# Patient Record
Sex: Female | Born: 1935 | Race: Black or African American | Hispanic: No
Health system: Southern US, Community
[De-identification: ages and names within clinical notes are randomized; demographics above are authoritative.]

## PROBLEM LIST (undated history)

## (undated) DIAGNOSIS — D649 Anemia, unspecified: Secondary | ICD-10-CM

## (undated) DIAGNOSIS — E119 Type 2 diabetes mellitus without complications: Secondary | ICD-10-CM

## (undated) DIAGNOSIS — I10 Essential (primary) hypertension: Secondary | ICD-10-CM

## (undated) DIAGNOSIS — N390 Urinary tract infection, site not specified: Secondary | ICD-10-CM

## (undated) DIAGNOSIS — S93409A Sprain of unspecified ligament of unspecified ankle, initial encounter: Secondary | ICD-10-CM

## (undated) DIAGNOSIS — E871 Hypo-osmolality and hyponatremia: Secondary | ICD-10-CM

## (undated) DIAGNOSIS — N189 Chronic kidney disease, unspecified: Secondary | ICD-10-CM

## (undated) DIAGNOSIS — E785 Hyperlipidemia, unspecified: Secondary | ICD-10-CM

## (undated) HISTORY — DX: Anemia, unspecified: D64.9

## (undated) HISTORY — DX: Essential (primary) hypertension: I10

## (undated) HISTORY — DX: Hyperlipidemia, unspecified: E78.5

## (undated) HISTORY — DX: Sprain of unspecified ligament of unspecified ankle, initial encounter: S93.409A

## (undated) HISTORY — DX: Hypo-osmolality and hyponatremia: E87.1

## (undated) HISTORY — DX: Type 2 diabetes mellitus without complications: E11.9

## (undated) HISTORY — DX: Chronic kidney disease, unspecified: N18.9

## (undated) HISTORY — DX: Urinary tract infection, site not specified: N39.0

---

## 1999-09-13 ENCOUNTER — Encounter: Admission: RE | Admit: 1999-09-13 | Discharge: 1999-12-12 | Payer: Self-pay | Admitting: Family Medicine

## 1999-11-16 ENCOUNTER — Encounter: Payer: Self-pay | Admitting: Family Medicine

## 1999-11-16 ENCOUNTER — Encounter: Admission: RE | Admit: 1999-11-16 | Discharge: 1999-11-16 | Payer: Self-pay | Admitting: Family Medicine

## 1999-11-23 ENCOUNTER — Encounter: Payer: Self-pay | Admitting: Family Medicine

## 1999-11-23 ENCOUNTER — Encounter: Admission: RE | Admit: 1999-11-23 | Discharge: 1999-11-23 | Payer: Self-pay | Admitting: Family Medicine

## 1999-12-08 ENCOUNTER — Encounter: Admission: RE | Admit: 1999-12-08 | Discharge: 2000-03-07 | Payer: Self-pay | Admitting: Internal Medicine

## 2000-03-14 ENCOUNTER — Encounter: Admission: RE | Admit: 2000-03-14 | Discharge: 2000-06-12 | Payer: Self-pay | Admitting: Internal Medicine

## 2000-05-23 ENCOUNTER — Encounter: Payer: Self-pay | Admitting: Family Medicine

## 2000-05-23 ENCOUNTER — Encounter: Admission: RE | Admit: 2000-05-23 | Discharge: 2000-05-23 | Payer: Self-pay | Admitting: Family Medicine

## 2000-11-27 ENCOUNTER — Encounter: Admission: RE | Admit: 2000-11-27 | Discharge: 2000-11-27 | Payer: Self-pay | Admitting: Family Medicine

## 2000-11-27 ENCOUNTER — Encounter: Payer: Self-pay | Admitting: Family Medicine

## 2002-02-08 ENCOUNTER — Encounter: Admission: RE | Admit: 2002-02-08 | Discharge: 2002-02-08 | Payer: Self-pay | Admitting: Family Medicine

## 2002-02-08 ENCOUNTER — Encounter: Payer: Self-pay | Admitting: Family Medicine

## 2003-02-11 ENCOUNTER — Encounter: Payer: Self-pay | Admitting: Family Medicine

## 2003-02-11 ENCOUNTER — Encounter: Admission: RE | Admit: 2003-02-11 | Discharge: 2003-02-11 | Payer: Self-pay | Admitting: Family Medicine

## 2003-02-27 ENCOUNTER — Emergency Department (HOSPITAL_COMMUNITY): Admission: EM | Admit: 2003-02-27 | Discharge: 2003-02-27 | Payer: Self-pay | Admitting: Emergency Medicine

## 2003-02-27 ENCOUNTER — Encounter: Payer: Self-pay | Admitting: Emergency Medicine

## 2004-05-12 ENCOUNTER — Encounter: Admission: RE | Admit: 2004-05-12 | Discharge: 2004-05-12 | Payer: Self-pay | Admitting: Internal Medicine

## 2005-07-06 ENCOUNTER — Emergency Department (HOSPITAL_COMMUNITY): Admission: EM | Admit: 2005-07-06 | Discharge: 2005-07-07 | Payer: Self-pay | Admitting: *Deleted

## 2005-11-10 ENCOUNTER — Encounter: Admission: RE | Admit: 2005-11-10 | Discharge: 2005-11-10 | Payer: Self-pay | Admitting: Internal Medicine

## 2007-02-27 ENCOUNTER — Encounter: Admission: RE | Admit: 2007-02-27 | Discharge: 2007-02-27 | Payer: Self-pay | Admitting: *Deleted

## 2008-03-05 ENCOUNTER — Encounter: Admission: RE | Admit: 2008-03-05 | Discharge: 2008-03-05 | Payer: Self-pay | Admitting: Family Medicine

## 2010-11-28 ENCOUNTER — Encounter: Payer: Self-pay | Admitting: Internal Medicine

## 2010-11-28 ENCOUNTER — Encounter: Payer: Self-pay | Admitting: *Deleted

## 2011-08-22 ENCOUNTER — Ambulatory Visit (INDEPENDENT_AMBULATORY_CARE_PROVIDER_SITE_OTHER): Payer: Medicare Other | Admitting: Ophthalmology

## 2011-08-22 DIAGNOSIS — H2700 Aphakia, unspecified eye: Secondary | ICD-10-CM

## 2011-08-22 DIAGNOSIS — E11359 Type 2 diabetes mellitus with proliferative diabetic retinopathy without macular edema: Secondary | ICD-10-CM

## 2011-08-22 DIAGNOSIS — H2510 Age-related nuclear cataract, unspecified eye: Secondary | ICD-10-CM

## 2011-08-22 DIAGNOSIS — H43819 Vitreous degeneration, unspecified eye: Secondary | ICD-10-CM

## 2012-02-06 DIAGNOSIS — E7800 Pure hypercholesterolemia, unspecified: Secondary | ICD-10-CM | POA: Diagnosis not present

## 2012-04-26 DIAGNOSIS — Z79899 Other long term (current) drug therapy: Secondary | ICD-10-CM | POA: Diagnosis not present

## 2012-04-26 DIAGNOSIS — I10 Essential (primary) hypertension: Secondary | ICD-10-CM | POA: Diagnosis not present

## 2012-04-26 DIAGNOSIS — E119 Type 2 diabetes mellitus without complications: Secondary | ICD-10-CM | POA: Diagnosis not present

## 2012-04-26 DIAGNOSIS — E7800 Pure hypercholesterolemia, unspecified: Secondary | ICD-10-CM | POA: Diagnosis not present

## 2012-05-21 ENCOUNTER — Encounter (INDEPENDENT_AMBULATORY_CARE_PROVIDER_SITE_OTHER): Payer: Medicare Other | Admitting: Ophthalmology

## 2012-05-21 DIAGNOSIS — E11359 Type 2 diabetes mellitus with proliferative diabetic retinopathy without macular edema: Secondary | ICD-10-CM

## 2012-05-21 DIAGNOSIS — E1165 Type 2 diabetes mellitus with hyperglycemia: Secondary | ICD-10-CM

## 2012-05-21 DIAGNOSIS — H43819 Vitreous degeneration, unspecified eye: Secondary | ICD-10-CM

## 2012-05-21 DIAGNOSIS — H35039 Hypertensive retinopathy, unspecified eye: Secondary | ICD-10-CM | POA: Diagnosis not present

## 2012-05-21 DIAGNOSIS — H27139 Posterior dislocation of lens, unspecified eye: Secondary | ICD-10-CM | POA: Diagnosis not present

## 2012-05-21 DIAGNOSIS — H2510 Age-related nuclear cataract, unspecified eye: Secondary | ICD-10-CM

## 2012-05-21 DIAGNOSIS — I10 Essential (primary) hypertension: Secondary | ICD-10-CM

## 2012-05-21 DIAGNOSIS — H2700 Aphakia, unspecified eye: Secondary | ICD-10-CM

## 2012-08-31 DIAGNOSIS — Z23 Encounter for immunization: Secondary | ICD-10-CM | POA: Diagnosis not present

## 2012-09-17 DIAGNOSIS — R7301 Impaired fasting glucose: Secondary | ICD-10-CM | POA: Diagnosis not present

## 2012-09-17 DIAGNOSIS — E161 Other hypoglycemia: Secondary | ICD-10-CM | POA: Diagnosis not present

## 2012-09-18 DIAGNOSIS — E1169 Type 2 diabetes mellitus with other specified complication: Secondary | ICD-10-CM | POA: Diagnosis not present

## 2012-10-09 DIAGNOSIS — E119 Type 2 diabetes mellitus without complications: Secondary | ICD-10-CM | POA: Diagnosis not present

## 2012-10-09 DIAGNOSIS — I10 Essential (primary) hypertension: Secondary | ICD-10-CM | POA: Diagnosis not present

## 2012-12-11 DIAGNOSIS — E119 Type 2 diabetes mellitus without complications: Secondary | ICD-10-CM | POA: Diagnosis not present

## 2012-12-11 DIAGNOSIS — E785 Hyperlipidemia, unspecified: Secondary | ICD-10-CM | POA: Diagnosis not present

## 2012-12-11 DIAGNOSIS — I10 Essential (primary) hypertension: Secondary | ICD-10-CM | POA: Diagnosis not present

## 2012-12-11 DIAGNOSIS — Z1331 Encounter for screening for depression: Secondary | ICD-10-CM | POA: Diagnosis not present

## 2013-02-25 ENCOUNTER — Ambulatory Visit (INDEPENDENT_AMBULATORY_CARE_PROVIDER_SITE_OTHER): Payer: Medicare Other | Admitting: Ophthalmology

## 2013-02-25 DIAGNOSIS — H2700 Aphakia, unspecified eye: Secondary | ICD-10-CM

## 2013-02-25 DIAGNOSIS — H35039 Hypertensive retinopathy, unspecified eye: Secondary | ICD-10-CM

## 2013-02-25 DIAGNOSIS — E1165 Type 2 diabetes mellitus with hyperglycemia: Secondary | ICD-10-CM | POA: Diagnosis not present

## 2013-02-25 DIAGNOSIS — H43819 Vitreous degeneration, unspecified eye: Secondary | ICD-10-CM

## 2013-02-25 DIAGNOSIS — E11359 Type 2 diabetes mellitus with proliferative diabetic retinopathy without macular edema: Secondary | ICD-10-CM

## 2013-02-25 DIAGNOSIS — E1139 Type 2 diabetes mellitus with other diabetic ophthalmic complication: Secondary | ICD-10-CM

## 2013-02-25 DIAGNOSIS — I10 Essential (primary) hypertension: Secondary | ICD-10-CM | POA: Diagnosis not present

## 2013-02-25 DIAGNOSIS — H2510 Age-related nuclear cataract, unspecified eye: Secondary | ICD-10-CM

## 2013-06-18 DIAGNOSIS — E7800 Pure hypercholesterolemia, unspecified: Secondary | ICD-10-CM | POA: Diagnosis not present

## 2013-06-18 DIAGNOSIS — Z23 Encounter for immunization: Secondary | ICD-10-CM | POA: Diagnosis not present

## 2013-06-18 DIAGNOSIS — E119 Type 2 diabetes mellitus without complications: Secondary | ICD-10-CM | POA: Diagnosis not present

## 2013-06-18 DIAGNOSIS — I10 Essential (primary) hypertension: Secondary | ICD-10-CM | POA: Diagnosis not present

## 2013-07-30 DIAGNOSIS — Z23 Encounter for immunization: Secondary | ICD-10-CM | POA: Diagnosis not present

## 2013-11-27 ENCOUNTER — Ambulatory Visit (INDEPENDENT_AMBULATORY_CARE_PROVIDER_SITE_OTHER): Payer: Medicare Other | Admitting: Ophthalmology

## 2013-11-27 DIAGNOSIS — E1165 Type 2 diabetes mellitus with hyperglycemia: Secondary | ICD-10-CM | POA: Diagnosis not present

## 2013-11-27 DIAGNOSIS — H2510 Age-related nuclear cataract, unspecified eye: Secondary | ICD-10-CM

## 2013-11-27 DIAGNOSIS — I10 Essential (primary) hypertension: Secondary | ICD-10-CM

## 2013-11-27 DIAGNOSIS — E1139 Type 2 diabetes mellitus with other diabetic ophthalmic complication: Secondary | ICD-10-CM | POA: Diagnosis not present

## 2013-11-27 DIAGNOSIS — H35039 Hypertensive retinopathy, unspecified eye: Secondary | ICD-10-CM

## 2013-11-27 DIAGNOSIS — H43819 Vitreous degeneration, unspecified eye: Secondary | ICD-10-CM

## 2013-11-27 DIAGNOSIS — E11359 Type 2 diabetes mellitus with proliferative diabetic retinopathy without macular edema: Secondary | ICD-10-CM | POA: Diagnosis not present

## 2013-12-17 DIAGNOSIS — I10 Essential (primary) hypertension: Secondary | ICD-10-CM | POA: Diagnosis not present

## 2013-12-17 DIAGNOSIS — M6740 Ganglion, unspecified site: Secondary | ICD-10-CM | POA: Diagnosis not present

## 2013-12-17 DIAGNOSIS — R32 Unspecified urinary incontinence: Secondary | ICD-10-CM | POA: Diagnosis not present

## 2013-12-17 DIAGNOSIS — Z1331 Encounter for screening for depression: Secondary | ICD-10-CM | POA: Diagnosis not present

## 2013-12-17 DIAGNOSIS — E119 Type 2 diabetes mellitus without complications: Secondary | ICD-10-CM | POA: Diagnosis not present

## 2013-12-17 DIAGNOSIS — E785 Hyperlipidemia, unspecified: Secondary | ICD-10-CM | POA: Diagnosis not present

## 2013-12-17 DIAGNOSIS — N183 Chronic kidney disease, stage 3 unspecified: Secondary | ICD-10-CM | POA: Diagnosis not present

## 2013-12-17 DIAGNOSIS — Z23 Encounter for immunization: Secondary | ICD-10-CM | POA: Diagnosis not present

## 2014-06-16 DIAGNOSIS — I10 Essential (primary) hypertension: Secondary | ICD-10-CM | POA: Diagnosis not present

## 2014-06-16 DIAGNOSIS — E559 Vitamin D deficiency, unspecified: Secondary | ICD-10-CM | POA: Diagnosis not present

## 2014-06-16 DIAGNOSIS — N183 Chronic kidney disease, stage 3 unspecified: Secondary | ICD-10-CM | POA: Diagnosis not present

## 2014-06-16 DIAGNOSIS — E119 Type 2 diabetes mellitus without complications: Secondary | ICD-10-CM | POA: Diagnosis not present

## 2014-06-16 DIAGNOSIS — E7800 Pure hypercholesterolemia, unspecified: Secondary | ICD-10-CM | POA: Diagnosis not present

## 2014-07-11 DIAGNOSIS — Z23 Encounter for immunization: Secondary | ICD-10-CM | POA: Diagnosis not present

## 2014-07-11 DIAGNOSIS — N183 Chronic kidney disease, stage 3 unspecified: Secondary | ICD-10-CM | POA: Diagnosis not present

## 2014-08-27 ENCOUNTER — Ambulatory Visit (INDEPENDENT_AMBULATORY_CARE_PROVIDER_SITE_OTHER): Payer: Medicare Other | Admitting: Ophthalmology

## 2014-08-27 DIAGNOSIS — E11359 Type 2 diabetes mellitus with proliferative diabetic retinopathy without macular edema: Secondary | ICD-10-CM | POA: Diagnosis not present

## 2014-08-27 DIAGNOSIS — E11351 Type 2 diabetes mellitus with proliferative diabetic retinopathy with macular edema: Secondary | ICD-10-CM

## 2014-08-27 DIAGNOSIS — H35033 Hypertensive retinopathy, bilateral: Secondary | ICD-10-CM

## 2014-08-27 DIAGNOSIS — H2702 Aphakia, left eye: Secondary | ICD-10-CM

## 2014-08-27 DIAGNOSIS — I10 Essential (primary) hypertension: Secondary | ICD-10-CM | POA: Diagnosis not present

## 2014-08-27 DIAGNOSIS — E11311 Type 2 diabetes mellitus with unspecified diabetic retinopathy with macular edema: Secondary | ICD-10-CM

## 2014-08-27 DIAGNOSIS — H2511 Age-related nuclear cataract, right eye: Secondary | ICD-10-CM

## 2014-12-17 DIAGNOSIS — R079 Chest pain, unspecified: Secondary | ICD-10-CM | POA: Diagnosis not present

## 2014-12-17 DIAGNOSIS — E119 Type 2 diabetes mellitus without complications: Secondary | ICD-10-CM | POA: Diagnosis not present

## 2014-12-17 DIAGNOSIS — E780 Pure hypercholesterolemia: Secondary | ICD-10-CM | POA: Diagnosis not present

## 2014-12-17 DIAGNOSIS — E559 Vitamin D deficiency, unspecified: Secondary | ICD-10-CM | POA: Diagnosis not present

## 2014-12-17 DIAGNOSIS — I10 Essential (primary) hypertension: Secondary | ICD-10-CM | POA: Diagnosis not present

## 2014-12-17 DIAGNOSIS — N189 Chronic kidney disease, unspecified: Secondary | ICD-10-CM | POA: Diagnosis not present

## 2014-12-26 DIAGNOSIS — R0789 Other chest pain: Secondary | ICD-10-CM | POA: Diagnosis not present

## 2014-12-26 DIAGNOSIS — N183 Chronic kidney disease, stage 3 (moderate): Secondary | ICD-10-CM | POA: Diagnosis not present

## 2014-12-26 DIAGNOSIS — I10 Essential (primary) hypertension: Secondary | ICD-10-CM | POA: Diagnosis not present

## 2014-12-26 DIAGNOSIS — E1121 Type 2 diabetes mellitus with diabetic nephropathy: Secondary | ICD-10-CM | POA: Diagnosis not present

## 2014-12-26 DIAGNOSIS — E668 Other obesity: Secondary | ICD-10-CM | POA: Diagnosis not present

## 2014-12-26 DIAGNOSIS — E784 Other hyperlipidemia: Secondary | ICD-10-CM | POA: Diagnosis not present

## 2014-12-26 DIAGNOSIS — R079 Chest pain, unspecified: Secondary | ICD-10-CM | POA: Diagnosis not present

## 2015-06-17 DIAGNOSIS — E559 Vitamin D deficiency, unspecified: Secondary | ICD-10-CM | POA: Diagnosis not present

## 2015-06-17 DIAGNOSIS — E119 Type 2 diabetes mellitus without complications: Secondary | ICD-10-CM | POA: Diagnosis not present

## 2015-06-17 DIAGNOSIS — Z1389 Encounter for screening for other disorder: Secondary | ICD-10-CM | POA: Diagnosis not present

## 2015-06-17 DIAGNOSIS — E780 Pure hypercholesterolemia: Secondary | ICD-10-CM | POA: Diagnosis not present

## 2015-06-17 DIAGNOSIS — I10 Essential (primary) hypertension: Secondary | ICD-10-CM | POA: Diagnosis not present

## 2015-06-17 DIAGNOSIS — N189 Chronic kidney disease, unspecified: Secondary | ICD-10-CM | POA: Diagnosis not present

## 2015-08-07 DIAGNOSIS — Z23 Encounter for immunization: Secondary | ICD-10-CM | POA: Diagnosis not present

## 2015-08-31 ENCOUNTER — Ambulatory Visit (INDEPENDENT_AMBULATORY_CARE_PROVIDER_SITE_OTHER): Payer: Medicare Other | Admitting: Ophthalmology

## 2015-08-31 DIAGNOSIS — H2702 Aphakia, left eye: Secondary | ICD-10-CM | POA: Diagnosis not present

## 2015-08-31 DIAGNOSIS — H43813 Vitreous degeneration, bilateral: Secondary | ICD-10-CM | POA: Diagnosis not present

## 2015-08-31 DIAGNOSIS — I10 Essential (primary) hypertension: Secondary | ICD-10-CM | POA: Diagnosis not present

## 2015-08-31 DIAGNOSIS — E11319 Type 2 diabetes mellitus with unspecified diabetic retinopathy without macular edema: Secondary | ICD-10-CM | POA: Diagnosis not present

## 2015-08-31 DIAGNOSIS — E113593 Type 2 diabetes mellitus with proliferative diabetic retinopathy without macular edema, bilateral: Secondary | ICD-10-CM | POA: Diagnosis not present

## 2015-08-31 DIAGNOSIS — H2511 Age-related nuclear cataract, right eye: Secondary | ICD-10-CM

## 2015-08-31 DIAGNOSIS — H35033 Hypertensive retinopathy, bilateral: Secondary | ICD-10-CM | POA: Diagnosis not present

## 2015-12-18 DIAGNOSIS — Z7984 Long term (current) use of oral hypoglycemic drugs: Secondary | ICD-10-CM | POA: Diagnosis not present

## 2015-12-18 DIAGNOSIS — M25561 Pain in right knee: Secondary | ICD-10-CM | POA: Diagnosis not present

## 2015-12-18 DIAGNOSIS — E559 Vitamin D deficiency, unspecified: Secondary | ICD-10-CM | POA: Diagnosis not present

## 2015-12-18 DIAGNOSIS — E7800 Pure hypercholesterolemia, unspecified: Secondary | ICD-10-CM | POA: Diagnosis not present

## 2015-12-18 DIAGNOSIS — N189 Chronic kidney disease, unspecified: Secondary | ICD-10-CM | POA: Diagnosis not present

## 2015-12-18 DIAGNOSIS — E119 Type 2 diabetes mellitus without complications: Secondary | ICD-10-CM | POA: Diagnosis not present

## 2015-12-18 DIAGNOSIS — I10 Essential (primary) hypertension: Secondary | ICD-10-CM | POA: Diagnosis not present

## 2016-06-02 ENCOUNTER — Ambulatory Visit (INDEPENDENT_AMBULATORY_CARE_PROVIDER_SITE_OTHER): Payer: Medicare Other | Admitting: Ophthalmology

## 2016-06-02 DIAGNOSIS — E113593 Type 2 diabetes mellitus with proliferative diabetic retinopathy without macular edema, bilateral: Secondary | ICD-10-CM

## 2016-06-02 DIAGNOSIS — E11319 Type 2 diabetes mellitus with unspecified diabetic retinopathy without macular edema: Secondary | ICD-10-CM | POA: Diagnosis not present

## 2016-06-02 DIAGNOSIS — H43813 Vitreous degeneration, bilateral: Secondary | ICD-10-CM | POA: Diagnosis not present

## 2016-06-02 DIAGNOSIS — I10 Essential (primary) hypertension: Secondary | ICD-10-CM | POA: Diagnosis not present

## 2016-06-02 DIAGNOSIS — H35033 Hypertensive retinopathy, bilateral: Secondary | ICD-10-CM

## 2016-06-02 DIAGNOSIS — H2511 Age-related nuclear cataract, right eye: Secondary | ICD-10-CM

## 2016-06-15 DIAGNOSIS — N189 Chronic kidney disease, unspecified: Secondary | ICD-10-CM | POA: Diagnosis not present

## 2016-06-15 DIAGNOSIS — I10 Essential (primary) hypertension: Secondary | ICD-10-CM | POA: Diagnosis not present

## 2016-06-15 DIAGNOSIS — E559 Vitamin D deficiency, unspecified: Secondary | ICD-10-CM | POA: Diagnosis not present

## 2016-06-15 DIAGNOSIS — E7800 Pure hypercholesterolemia, unspecified: Secondary | ICD-10-CM | POA: Diagnosis not present

## 2016-06-15 DIAGNOSIS — Z1389 Encounter for screening for other disorder: Secondary | ICD-10-CM | POA: Diagnosis not present

## 2016-06-15 DIAGNOSIS — Z7984 Long term (current) use of oral hypoglycemic drugs: Secondary | ICD-10-CM | POA: Diagnosis not present

## 2016-06-15 DIAGNOSIS — E119 Type 2 diabetes mellitus without complications: Secondary | ICD-10-CM | POA: Diagnosis not present

## 2016-07-28 DIAGNOSIS — Z23 Encounter for immunization: Secondary | ICD-10-CM | POA: Diagnosis not present

## 2016-12-16 DIAGNOSIS — Z7984 Long term (current) use of oral hypoglycemic drugs: Secondary | ICD-10-CM | POA: Diagnosis not present

## 2016-12-16 DIAGNOSIS — E559 Vitamin D deficiency, unspecified: Secondary | ICD-10-CM | POA: Diagnosis not present

## 2016-12-16 DIAGNOSIS — E7800 Pure hypercholesterolemia, unspecified: Secondary | ICD-10-CM | POA: Diagnosis not present

## 2016-12-16 DIAGNOSIS — N189 Chronic kidney disease, unspecified: Secondary | ICD-10-CM | POA: Diagnosis not present

## 2016-12-16 DIAGNOSIS — I10 Essential (primary) hypertension: Secondary | ICD-10-CM | POA: Diagnosis not present

## 2016-12-16 DIAGNOSIS — E119 Type 2 diabetes mellitus without complications: Secondary | ICD-10-CM | POA: Diagnosis not present

## 2016-12-16 DIAGNOSIS — R350 Frequency of micturition: Secondary | ICD-10-CM | POA: Diagnosis not present

## 2017-06-02 ENCOUNTER — Ambulatory Visit (INDEPENDENT_AMBULATORY_CARE_PROVIDER_SITE_OTHER): Payer: Medicare Other | Admitting: Ophthalmology

## 2017-06-02 DIAGNOSIS — E11319 Type 2 diabetes mellitus with unspecified diabetic retinopathy without macular edema: Secondary | ICD-10-CM

## 2017-06-02 DIAGNOSIS — E113593 Type 2 diabetes mellitus with proliferative diabetic retinopathy without macular edema, bilateral: Secondary | ICD-10-CM

## 2017-06-02 DIAGNOSIS — H43813 Vitreous degeneration, bilateral: Secondary | ICD-10-CM

## 2017-06-02 DIAGNOSIS — I10 Essential (primary) hypertension: Secondary | ICD-10-CM

## 2017-06-02 DIAGNOSIS — H35033 Hypertensive retinopathy, bilateral: Secondary | ICD-10-CM | POA: Diagnosis not present

## 2017-06-27 DIAGNOSIS — I10 Essential (primary) hypertension: Secondary | ICD-10-CM | POA: Diagnosis not present

## 2017-06-27 DIAGNOSIS — E7800 Pure hypercholesterolemia, unspecified: Secondary | ICD-10-CM | POA: Diagnosis not present

## 2017-06-27 DIAGNOSIS — E119 Type 2 diabetes mellitus without complications: Secondary | ICD-10-CM | POA: Diagnosis not present

## 2017-06-27 DIAGNOSIS — R351 Nocturia: Secondary | ICD-10-CM | POA: Diagnosis not present

## 2017-06-27 DIAGNOSIS — Z7984 Long term (current) use of oral hypoglycemic drugs: Secondary | ICD-10-CM | POA: Diagnosis not present

## 2017-06-27 DIAGNOSIS — N189 Chronic kidney disease, unspecified: Secondary | ICD-10-CM | POA: Diagnosis not present

## 2017-06-27 DIAGNOSIS — Z23 Encounter for immunization: Secondary | ICD-10-CM | POA: Diagnosis not present

## 2017-06-27 DIAGNOSIS — E559 Vitamin D deficiency, unspecified: Secondary | ICD-10-CM | POA: Diagnosis not present

## 2017-08-08 DIAGNOSIS — N390 Urinary tract infection, site not specified: Secondary | ICD-10-CM | POA: Diagnosis not present

## 2017-12-28 DIAGNOSIS — E119 Type 2 diabetes mellitus without complications: Secondary | ICD-10-CM | POA: Diagnosis not present

## 2017-12-28 DIAGNOSIS — R0989 Other specified symptoms and signs involving the circulatory and respiratory systems: Secondary | ICD-10-CM | POA: Diagnosis not present

## 2017-12-28 DIAGNOSIS — E7800 Pure hypercholesterolemia, unspecified: Secondary | ICD-10-CM | POA: Diagnosis not present

## 2017-12-28 DIAGNOSIS — I10 Essential (primary) hypertension: Secondary | ICD-10-CM | POA: Diagnosis not present

## 2017-12-28 DIAGNOSIS — N189 Chronic kidney disease, unspecified: Secondary | ICD-10-CM | POA: Diagnosis not present

## 2017-12-28 DIAGNOSIS — Z7984 Long term (current) use of oral hypoglycemic drugs: Secondary | ICD-10-CM | POA: Diagnosis not present

## 2017-12-28 DIAGNOSIS — E559 Vitamin D deficiency, unspecified: Secondary | ICD-10-CM | POA: Diagnosis not present

## 2017-12-29 ENCOUNTER — Other Ambulatory Visit: Payer: Self-pay | Admitting: Family Medicine

## 2017-12-29 DIAGNOSIS — R0989 Other specified symptoms and signs involving the circulatory and respiratory systems: Secondary | ICD-10-CM

## 2018-01-08 ENCOUNTER — Ambulatory Visit
Admission: RE | Admit: 2018-01-08 | Discharge: 2018-01-08 | Disposition: A | Discharge status: Home / Self Care | Payer: Medicare Other | Source: Ambulatory Visit | Attending: Family Medicine | Admitting: Family Medicine

## 2018-01-08 DIAGNOSIS — E785 Hyperlipidemia, unspecified: Secondary | ICD-10-CM | POA: Diagnosis not present

## 2018-01-08 DIAGNOSIS — R0989 Other specified symptoms and signs involving the circulatory and respiratory systems: Secondary | ICD-10-CM

## 2018-01-08 DIAGNOSIS — I10 Essential (primary) hypertension: Secondary | ICD-10-CM | POA: Diagnosis not present

## 2018-06-12 ENCOUNTER — Encounter (INDEPENDENT_AMBULATORY_CARE_PROVIDER_SITE_OTHER): Payer: Medicare Other | Admitting: Ophthalmology

## 2018-06-12 DIAGNOSIS — E10319 Type 1 diabetes mellitus with unspecified diabetic retinopathy without macular edema: Secondary | ICD-10-CM

## 2018-06-12 DIAGNOSIS — E103593 Type 1 diabetes mellitus with proliferative diabetic retinopathy without macular edema, bilateral: Secondary | ICD-10-CM | POA: Diagnosis not present

## 2018-06-12 DIAGNOSIS — I10 Essential (primary) hypertension: Secondary | ICD-10-CM | POA: Diagnosis not present

## 2018-06-12 DIAGNOSIS — H2702 Aphakia, left eye: Secondary | ICD-10-CM

## 2018-06-12 DIAGNOSIS — H2511 Age-related nuclear cataract, right eye: Secondary | ICD-10-CM

## 2018-06-12 DIAGNOSIS — H43813 Vitreous degeneration, bilateral: Secondary | ICD-10-CM

## 2018-06-12 DIAGNOSIS — H35033 Hypertensive retinopathy, bilateral: Secondary | ICD-10-CM | POA: Diagnosis not present

## 2018-07-03 DIAGNOSIS — E559 Vitamin D deficiency, unspecified: Secondary | ICD-10-CM | POA: Diagnosis not present

## 2018-07-03 DIAGNOSIS — Z1331 Encounter for screening for depression: Secondary | ICD-10-CM | POA: Diagnosis not present

## 2018-07-03 DIAGNOSIS — Z0000 Encounter for general adult medical examination without abnormal findings: Secondary | ICD-10-CM | POA: Diagnosis not present

## 2018-07-03 DIAGNOSIS — R350 Frequency of micturition: Secondary | ICD-10-CM | POA: Diagnosis not present

## 2018-07-03 DIAGNOSIS — E119 Type 2 diabetes mellitus without complications: Secondary | ICD-10-CM | POA: Diagnosis not present

## 2018-07-03 DIAGNOSIS — I10 Essential (primary) hypertension: Secondary | ICD-10-CM | POA: Diagnosis not present

## 2018-07-03 DIAGNOSIS — N189 Chronic kidney disease, unspecified: Secondary | ICD-10-CM | POA: Diagnosis not present

## 2018-07-03 DIAGNOSIS — E7800 Pure hypercholesterolemia, unspecified: Secondary | ICD-10-CM | POA: Diagnosis not present

## 2018-08-07 DIAGNOSIS — Z23 Encounter for immunization: Secondary | ICD-10-CM | POA: Diagnosis not present

## 2019-01-07 DIAGNOSIS — Z7984 Long term (current) use of oral hypoglycemic drugs: Secondary | ICD-10-CM | POA: Diagnosis not present

## 2019-01-07 DIAGNOSIS — E7800 Pure hypercholesterolemia, unspecified: Secondary | ICD-10-CM | POA: Diagnosis not present

## 2019-01-07 DIAGNOSIS — E119 Type 2 diabetes mellitus without complications: Secondary | ICD-10-CM | POA: Diagnosis not present

## 2019-01-07 DIAGNOSIS — E559 Vitamin D deficiency, unspecified: Secondary | ICD-10-CM | POA: Diagnosis not present

## 2019-01-07 DIAGNOSIS — I10 Essential (primary) hypertension: Secondary | ICD-10-CM | POA: Diagnosis not present

## 2019-01-07 DIAGNOSIS — N189 Chronic kidney disease, unspecified: Secondary | ICD-10-CM | POA: Diagnosis not present

## 2019-06-13 ENCOUNTER — Encounter (INDEPENDENT_AMBULATORY_CARE_PROVIDER_SITE_OTHER): Payer: Medicare Other | Admitting: Ophthalmology

## 2019-07-23 DIAGNOSIS — Z23 Encounter for immunization: Secondary | ICD-10-CM | POA: Diagnosis not present

## 2019-09-12 ENCOUNTER — Encounter (INDEPENDENT_AMBULATORY_CARE_PROVIDER_SITE_OTHER): Payer: Medicare Other | Admitting: Ophthalmology

## 2020-01-14 DIAGNOSIS — I10 Essential (primary) hypertension: Secondary | ICD-10-CM | POA: Diagnosis not present

## 2020-01-14 DIAGNOSIS — E7800 Pure hypercholesterolemia, unspecified: Secondary | ICD-10-CM | POA: Diagnosis not present

## 2020-01-14 DIAGNOSIS — E559 Vitamin D deficiency, unspecified: Secondary | ICD-10-CM | POA: Diagnosis not present

## 2020-01-14 DIAGNOSIS — E119 Type 2 diabetes mellitus without complications: Secondary | ICD-10-CM | POA: Diagnosis not present

## 2020-01-14 DIAGNOSIS — N189 Chronic kidney disease, unspecified: Secondary | ICD-10-CM | POA: Diagnosis not present

## 2020-07-21 DIAGNOSIS — I10 Essential (primary) hypertension: Secondary | ICD-10-CM | POA: Diagnosis not present

## 2020-07-21 DIAGNOSIS — N189 Chronic kidney disease, unspecified: Secondary | ICD-10-CM | POA: Diagnosis not present

## 2020-07-21 DIAGNOSIS — E559 Vitamin D deficiency, unspecified: Secondary | ICD-10-CM | POA: Diagnosis not present

## 2020-07-21 DIAGNOSIS — E7800 Pure hypercholesterolemia, unspecified: Secondary | ICD-10-CM | POA: Diagnosis not present

## 2020-07-21 DIAGNOSIS — E119 Type 2 diabetes mellitus without complications: Secondary | ICD-10-CM | POA: Diagnosis not present

## 2021-07-22 DIAGNOSIS — I10 Essential (primary) hypertension: Secondary | ICD-10-CM | POA: Diagnosis not present

## 2021-07-22 DIAGNOSIS — N189 Chronic kidney disease, unspecified: Secondary | ICD-10-CM | POA: Diagnosis not present

## 2021-07-22 DIAGNOSIS — M25549 Pain in joints of unspecified hand: Secondary | ICD-10-CM | POA: Diagnosis not present

## 2021-07-22 DIAGNOSIS — E7800 Pure hypercholesterolemia, unspecified: Secondary | ICD-10-CM | POA: Diagnosis not present

## 2021-07-22 DIAGNOSIS — E559 Vitamin D deficiency, unspecified: Secondary | ICD-10-CM | POA: Diagnosis not present

## 2021-07-22 DIAGNOSIS — E1122 Type 2 diabetes mellitus with diabetic chronic kidney disease: Secondary | ICD-10-CM | POA: Diagnosis not present

## 2021-08-03 DIAGNOSIS — E1122 Type 2 diabetes mellitus with diabetic chronic kidney disease: Secondary | ICD-10-CM | POA: Diagnosis not present

## 2021-08-03 DIAGNOSIS — E7800 Pure hypercholesterolemia, unspecified: Secondary | ICD-10-CM | POA: Diagnosis not present

## 2021-08-03 DIAGNOSIS — N189 Chronic kidney disease, unspecified: Secondary | ICD-10-CM | POA: Diagnosis not present

## 2022-07-06 DIAGNOSIS — I10 Essential (primary) hypertension: Secondary | ICD-10-CM | POA: Diagnosis not present

## 2022-07-06 DIAGNOSIS — E559 Vitamin D deficiency, unspecified: Secondary | ICD-10-CM | POA: Diagnosis not present

## 2022-07-06 DIAGNOSIS — E1122 Type 2 diabetes mellitus with diabetic chronic kidney disease: Secondary | ICD-10-CM | POA: Diagnosis not present

## 2022-07-06 DIAGNOSIS — N189 Chronic kidney disease, unspecified: Secondary | ICD-10-CM | POA: Diagnosis not present

## 2022-07-06 DIAGNOSIS — M25549 Pain in joints of unspecified hand: Secondary | ICD-10-CM | POA: Diagnosis not present

## 2022-07-06 DIAGNOSIS — E7800 Pure hypercholesterolemia, unspecified: Secondary | ICD-10-CM | POA: Diagnosis not present

## 2022-07-14 ENCOUNTER — Emergency Department (HOSPITAL_COMMUNITY): Payer: Medicare Other

## 2022-07-14 ENCOUNTER — Emergency Department (HOSPITAL_COMMUNITY)
Admission: EM | Admit: 2022-07-14 | Discharge: 2022-07-14 | Disposition: A | Discharge status: Home / Self Care | Payer: Medicare Other | Attending: Emergency Medicine | Admitting: Emergency Medicine

## 2022-07-14 ENCOUNTER — Other Ambulatory Visit: Payer: Self-pay

## 2022-07-14 DIAGNOSIS — Z043 Encounter for examination and observation following other accident: Secondary | ICD-10-CM | POA: Diagnosis not present

## 2022-07-14 DIAGNOSIS — S0993XA Unspecified injury of face, initial encounter: Secondary | ICD-10-CM | POA: Diagnosis not present

## 2022-07-14 DIAGNOSIS — R6889 Other general symptoms and signs: Secondary | ICD-10-CM | POA: Diagnosis not present

## 2022-07-14 DIAGNOSIS — S0083XA Contusion of other part of head, initial encounter: Secondary | ICD-10-CM | POA: Insufficient documentation

## 2022-07-14 DIAGNOSIS — W01190A Fall on same level from slipping, tripping and stumbling with subsequent striking against furniture, initial encounter: Secondary | ICD-10-CM | POA: Insufficient documentation

## 2022-07-14 DIAGNOSIS — S0990XA Unspecified injury of head, initial encounter: Secondary | ICD-10-CM | POA: Diagnosis not present

## 2022-07-14 DIAGNOSIS — W19XXXA Unspecified fall, initial encounter: Secondary | ICD-10-CM | POA: Diagnosis not present

## 2022-07-14 DIAGNOSIS — I672 Cerebral atherosclerosis: Secondary | ICD-10-CM | POA: Diagnosis not present

## 2022-07-14 DIAGNOSIS — G8929 Other chronic pain: Secondary | ICD-10-CM | POA: Diagnosis not present

## 2022-07-14 DIAGNOSIS — Z743 Need for continuous supervision: Secondary | ICD-10-CM | POA: Diagnosis not present

## 2022-07-14 NOTE — ED Provider Notes (Signed)
[facility] EMERGENCY DEPARTMENT Provider Note   CSN: [id number] Arrival date & time: 07/14/22  1758     History  Chief Complaint  Patient presents with   Joanne Patterson    Joanne Patterson is a 88 y.o. female.   Fall     This patient is a 88 year old female, she has a history of no anticoagulants, she lives by herself, she was ambulating in her bedroom when she got tripped up and fell forward striking the right side of her face against a dresser and falling to the floor.  She felt a little bit disoriented but did not pass out, she was able to have her daughter call for 911 assistance.  The paramedics helped her to stand up get over to the bed and then get onto the ambulance stretcher.  She had normal vital signs in route, she was without any complaints except for mild pain to the right side of her face around the right periorbital area.  She has no nausea or vomiting, she is not dizzy, she is not having chest pain or shortness of breath.  Home Medications Prior to Admission medications   Not on File      Allergies    Patient has no allergy information on record.    Review of Systems   Review of Systems  All other systems reviewed and are negative.   Physical Exam Updated Vital Signs BP (!) 142/51   Pulse 70   Temp 98.5 F (36.9 C) (Oral)   Resp 17   SpO2 99%  Physical Exam Vitals and nursing note reviewed.  Constitutional:      General: She is not in acute distress.    Appearance: She is well-developed.  HENT:     Head: Normocephalic.     Comments: Mild amount of bruising in the right periorbital region, mild tenderness over the orbital rim, no malocclusion    Mouth/Throat:     Pharynx: No oropharyngeal exudate.  Eyes:     General: No scleral icterus.       Right eye: No discharge.        Left eye: No discharge.     Conjunctiva/sclera: Conjunctivae normal.     Pupils: Pupils are equal, round, and reactive to light.  Neck:     Thyroid: No  thyromegaly.     Vascular: No JVD.  Cardiovascular:     Rate and Rhythm: Normal rate and regular rhythm.     Heart sounds: Normal heart sounds. No murmur heard.    No friction rub. No gallop.  Pulmonary:     Effort: Pulmonary effort is normal. No respiratory distress.     Breath sounds: Normal breath sounds. No wheezing or rales.  Chest:     Chest wall: No tenderness.  Abdominal:     General: Bowel sounds are normal. There is no distension.     Palpations: Abdomen is soft. There is no mass.     Tenderness: There is no abdominal tenderness.  Musculoskeletal:        General: No tenderness. Normal range of motion.     Cervical back: Normal range of motion and neck supple.     Right lower leg: No edema.     Left lower leg: No edema.  Lymphadenopathy:     Cervical: No cervical adenopathy.  Skin:    General: Skin is warm and dry.     Findings: No erythema or rash.  Neurological:     General: No  focal deficit present.     Mental Status: She is alert.     Coordination: Coordination normal.     Comments: Awake alert and follows commands without difficulty in all 4 extremities  Psychiatric:        Behavior: Behavior normal.     ED Results / Procedures / Treatments   Labs (all labs ordered are listed, but only abnormal results are displayed) Labs Reviewed - No data to display  EKG None  Radiology CT Head Wo Contrast  Result Date: 07/14/2022 CLINICAL DATA:  Facial trauma, blunt.  Mechanical fall EXAM: CT HEAD WITHOUT CONTRAST CT MAXILLOFACIAL WITHOUT CONTRAST CT CERVICAL SPINE WITHOUT CONTRAST TECHNIQUE: Multidetector CT imaging of the head, cervical spine, and maxillofacial structures were performed using the standard protocol without intravenous contrast. Multiplanar CT image reconstructions of the cervical spine and maxillofacial structures were also generated. RADIATION DOSE REDUCTION: This exam was performed according to the departmental dose-optimization program which includes  automated exposure control, adjustment of the mA and/or kV according to patient size and/or use of iterative reconstruction technique. COMPARISON:  None Available. FINDINGS: CT HEAD FINDINGS Brain: No evidence of large-territorial acute infarction. No parenchymal hemorrhage. No mass lesion. No extra-axial collection. No mass effect or midline shift. No hydrocephalus. Basilar cisterns are patent. Vascular: No hyperdense vessel. Atherosclerotic calcifications are present within the cavernous internal carotid and vertebral arteries. Skull: No acute fracture or focal lesion. Other: None. CT MAXILLOFACIAL FINDINGS Osseous: No fracture or mandibular dislocation. No destructive process. Sinuses/Orbits: Paranasal sinuses and mastoid air cells are clear. Similar appearance of the left orbit. Otherwise the orbits are unremarkable. Soft tissues: Negative. CT CERVICAL SPINE FINDINGS Alignment: Normal. Skull base and vertebrae: Multilevel mild-to-moderate degenerative changes of the spine. No associated severe osseous neural foraminal or central canal stenosis. No acute fracture. No aggressive appearing focal osseous lesion or focal pathologic process. Soft tissues and spinal canal: No prevertebral fluid or swelling. No visible canal hematoma. Upper chest: Unremarkable. Other: Atherosclerotic plaque of the branches off of the aortic arch. IMPRESSION: 1. No acute intracranial abnormality. 2. No acute displaced facial fracture. 3. No acute displaced fracture or traumatic listhesis of the cervical spine. Electronically Signed   By: Tish Frederickson M.D.   On: 07/14/2022 20:23   CT Maxillofacial Wo Contrast  Result Date: 07/14/2022 CLINICAL DATA:  Facial trauma, blunt.  Mechanical fall EXAM: CT HEAD WITHOUT CONTRAST CT MAXILLOFACIAL WITHOUT CONTRAST CT CERVICAL SPINE WITHOUT CONTRAST TECHNIQUE: Multidetector CT imaging of the head, cervical spine, and maxillofacial structures were performed using the standard protocol without  intravenous contrast. Multiplanar CT image reconstructions of the cervical spine and maxillofacial structures were also generated. RADIATION DOSE REDUCTION: This exam was performed according to the departmental dose-optimization program which includes automated exposure control, adjustment of the mA and/or kV according to patient size and/or use of iterative reconstruction technique. COMPARISON:  None Available. FINDINGS: CT HEAD FINDINGS Brain: No evidence of large-territorial acute infarction. No parenchymal hemorrhage. No mass lesion. No extra-axial collection. No mass effect or midline shift. No hydrocephalus. Basilar cisterns are patent. Vascular: No hyperdense vessel. Atherosclerotic calcifications are present within the cavernous internal carotid and vertebral arteries. Skull: No acute fracture or focal lesion. Other: None. CT MAXILLOFACIAL FINDINGS Osseous: No fracture or mandibular dislocation. No destructive process. Sinuses/Orbits: Paranasal sinuses and mastoid air cells are clear. Similar appearance of the left orbit. Otherwise the orbits are unremarkable. Soft tissues: Negative. CT CERVICAL SPINE FINDINGS Alignment: Normal. Skull base and vertebrae: Multilevel mild-to-moderate degenerative changes of  the spine. No associated severe osseous neural foraminal or central canal stenosis. No acute fracture. No aggressive appearing focal osseous lesion or focal pathologic process. Soft tissues and spinal canal: No prevertebral fluid or swelling. No visible canal hematoma. Upper chest: Unremarkable. Other: Atherosclerotic plaque of the branches off of the aortic arch. IMPRESSION: 1. No acute intracranial abnormality. 2. No acute displaced facial fracture. 3. No acute displaced fracture or traumatic listhesis of the cervical spine. Electronically Signed   By: Tish Frederickson M.D.   On: 07/14/2022 20:23   CT Cervical Spine Wo Contrast  Result Date: 07/14/2022 CLINICAL DATA:  Facial trauma, blunt.  Mechanical  fall EXAM: CT HEAD WITHOUT CONTRAST CT MAXILLOFACIAL WITHOUT CONTRAST CT CERVICAL SPINE WITHOUT CONTRAST TECHNIQUE: Multidetector CT imaging of the head, cervical spine, and maxillofacial structures were performed using the standard protocol without intravenous contrast. Multiplanar CT image reconstructions of the cervical spine and maxillofacial structures were also generated. RADIATION DOSE REDUCTION: This exam was performed according to the departmental dose-optimization program which includes automated exposure control, adjustment of the mA and/or kV according to patient size and/or use of iterative reconstruction technique. COMPARISON:  None Available. FINDINGS: CT HEAD FINDINGS Brain: No evidence of large-territorial acute infarction. No parenchymal hemorrhage. No mass lesion. No extra-axial collection. No mass effect or midline shift. No hydrocephalus. Basilar cisterns are patent. Vascular: No hyperdense vessel. Atherosclerotic calcifications are present within the cavernous internal carotid and vertebral arteries. Skull: No acute fracture or focal lesion. Other: None. CT MAXILLOFACIAL FINDINGS Osseous: No fracture or mandibular dislocation. No destructive process. Sinuses/Orbits: Paranasal sinuses and mastoid air cells are clear. Similar appearance of the left orbit. Otherwise the orbits are unremarkable. Soft tissues: Negative. CT CERVICAL SPINE FINDINGS Alignment: Normal. Skull base and vertebrae: Multilevel mild-to-moderate degenerative changes of the spine. No associated severe osseous neural foraminal or central canal stenosis. No acute fracture. No aggressive appearing focal osseous lesion or focal pathologic process. Soft tissues and spinal canal: No prevertebral fluid or swelling. No visible canal hematoma. Upper chest: Unremarkable. Other: Atherosclerotic plaque of the branches off of the aortic arch. IMPRESSION: 1. No acute intracranial abnormality. 2. No acute displaced facial fracture. 3. No  acute displaced fracture or traumatic listhesis of the cervical spine. Electronically Signed   By: Tish Frederickson M.D.   On: 07/14/2022 20:23    Procedures Procedures    Medications Ordered in ED Medications - No data to display  ED Course/ Medical Decision Making/ A&P                           Medical Decision Making Amount and/or Complexity of Data Reviewed Radiology: ordered.   This patient presents to the ED for concern of head injury after a fall differential diagnosis includes trauma to the face maxillofacial structures, no cervical spine tenderness but will need imaging given head injury    Additional history obtained:  Additional history obtained from paramedics External records from outside source obtained and reviewed including prehospital vital signs   Imaging Studies ordered:  I ordered imaging studies including CT head, C spine and CT maxillofacial studies  I independently visualized and interpreted imaging which showed no fractures I agree with the radiologist interpretation   Medicines ordered and prescription drug management:   I have reviewed the patients home medicines and have made adjustments as needed   Problem List / ED Course:  No fractures Patient updated   Social Determinants of Health:  None  Final Clinical Impression(s) / ED Diagnoses Final diagnoses:  Contusion of face, initial encounter  Minor head injury, initial encounter    Rx / DC Orders ED Discharge Orders     None         Eber Hong, MD 07/14/22 2137

## 2022-07-14 NOTE — Discharge Instructions (Addendum)
Your xrays are normal Packs for swelling, Tylenol or ibuprofen as needed for pain ER for worsening symptoms otherwise see your doctor in 3 days

## 2022-07-14 NOTE — ED Triage Notes (Signed)
Pt bib [facility] from home for mechanical fall from bed trying to go to bathroom. Pt tripped and hit rt side of face on a wooden dresser. Contusion to the rt side of her face. A&Ox4, GCS 15 with EMS, at baseline according to family. Abrasion to eyebrow and right nostril. Denies LOC, n/v, no thinners.    EMS vitals 180/98 BP 76 HR 98 O2 136 CBG

## 2022-11-17 DIAGNOSIS — I10 Essential (primary) hypertension: Secondary | ICD-10-CM | POA: Diagnosis not present

## 2022-11-17 DIAGNOSIS — E1122 Type 2 diabetes mellitus with diabetic chronic kidney disease: Secondary | ICD-10-CM | POA: Diagnosis not present

## 2022-11-22 DIAGNOSIS — R410 Disorientation, unspecified: Secondary | ICD-10-CM | POA: Diagnosis not present

## 2023-01-16 DIAGNOSIS — E1122 Type 2 diabetes mellitus with diabetic chronic kidney disease: Secondary | ICD-10-CM | POA: Diagnosis not present

## 2023-01-16 DIAGNOSIS — N390 Urinary tract infection, site not specified: Secondary | ICD-10-CM | POA: Diagnosis not present

## 2023-01-16 DIAGNOSIS — E785 Hyperlipidemia, unspecified: Secondary | ICD-10-CM | POA: Diagnosis not present

## 2023-01-16 DIAGNOSIS — I129 Hypertensive chronic kidney disease with stage 1 through stage 4 chronic kidney disease, or unspecified chronic kidney disease: Secondary | ICD-10-CM | POA: Diagnosis not present

## 2023-01-16 DIAGNOSIS — R350 Frequency of micturition: Secondary | ICD-10-CM | POA: Diagnosis not present

## 2023-01-16 DIAGNOSIS — N1832 Chronic kidney disease, stage 3b: Secondary | ICD-10-CM | POA: Diagnosis not present

## 2023-01-17 ENCOUNTER — Other Ambulatory Visit: Payer: Self-pay | Admitting: Internal Medicine

## 2023-01-17 DIAGNOSIS — N1832 Chronic kidney disease, stage 3b: Secondary | ICD-10-CM

## 2023-02-01 ENCOUNTER — Other Ambulatory Visit: Payer: Medicare Other

## 2023-03-03 ENCOUNTER — Ambulatory Visit
Admission: RE | Admit: 2023-03-03 | Discharge: 2023-03-03 | Disposition: A | Discharge status: Home / Self Care | Payer: Medicare Other | Source: Ambulatory Visit | Attending: Internal Medicine | Admitting: Internal Medicine

## 2023-03-03 DIAGNOSIS — N189 Chronic kidney disease, unspecified: Secondary | ICD-10-CM | POA: Diagnosis not present

## 2023-03-03 DIAGNOSIS — N1832 Chronic kidney disease, stage 3b: Secondary | ICD-10-CM

## 2023-05-19 DIAGNOSIS — R350 Frequency of micturition: Secondary | ICD-10-CM | POA: Diagnosis not present

## 2023-07-07 DIAGNOSIS — E7800 Pure hypercholesterolemia, unspecified: Secondary | ICD-10-CM | POA: Diagnosis not present

## 2023-07-07 DIAGNOSIS — N1832 Chronic kidney disease, stage 3b: Secondary | ICD-10-CM | POA: Diagnosis not present

## 2023-07-07 DIAGNOSIS — I10 Essential (primary) hypertension: Secondary | ICD-10-CM | POA: Diagnosis not present

## 2023-07-07 DIAGNOSIS — E1122 Type 2 diabetes mellitus with diabetic chronic kidney disease: Secondary | ICD-10-CM | POA: Diagnosis not present

## 2023-07-07 DIAGNOSIS — R350 Frequency of micturition: Secondary | ICD-10-CM | POA: Diagnosis not present

## 2023-07-27 DIAGNOSIS — E785 Hyperlipidemia, unspecified: Secondary | ICD-10-CM | POA: Diagnosis not present

## 2023-07-27 DIAGNOSIS — R350 Frequency of micturition: Secondary | ICD-10-CM | POA: Diagnosis not present

## 2023-07-27 DIAGNOSIS — E1122 Type 2 diabetes mellitus with diabetic chronic kidney disease: Secondary | ICD-10-CM | POA: Diagnosis not present

## 2023-07-27 DIAGNOSIS — R001 Bradycardia, unspecified: Secondary | ICD-10-CM | POA: Diagnosis not present

## 2023-07-27 DIAGNOSIS — N1832 Chronic kidney disease, stage 3b: Secondary | ICD-10-CM | POA: Diagnosis not present

## 2023-07-27 DIAGNOSIS — I129 Hypertensive chronic kidney disease with stage 1 through stage 4 chronic kidney disease, or unspecified chronic kidney disease: Secondary | ICD-10-CM | POA: Diagnosis not present

## 2023-08-12 DIAGNOSIS — R52 Pain, unspecified: Secondary | ICD-10-CM | POA: Diagnosis not present

## 2023-08-12 DIAGNOSIS — R051 Acute cough: Secondary | ICD-10-CM | POA: Diagnosis not present

## 2023-08-12 DIAGNOSIS — R5383 Other fatigue: Secondary | ICD-10-CM | POA: Diagnosis not present

## 2023-08-12 DIAGNOSIS — N390 Urinary tract infection, site not specified: Secondary | ICD-10-CM | POA: Diagnosis not present

## 2023-08-12 DIAGNOSIS — Z03818 Encounter for observation for suspected exposure to other biological agents ruled out: Secondary | ICD-10-CM | POA: Diagnosis not present

## 2023-08-15 DIAGNOSIS — R350 Frequency of micturition: Secondary | ICD-10-CM | POA: Diagnosis not present

## 2023-08-15 DIAGNOSIS — N1832 Chronic kidney disease, stage 3b: Secondary | ICD-10-CM | POA: Diagnosis not present

## 2023-08-16 ENCOUNTER — Encounter (HOSPITAL_COMMUNITY): Payer: Self-pay | Admitting: Internal Medicine

## 2023-08-16 ENCOUNTER — Other Ambulatory Visit: Payer: Self-pay

## 2023-08-16 ENCOUNTER — Inpatient Hospital Stay (HOSPITAL_COMMUNITY)
Admission: EM | Admit: 2023-08-16 | Discharge: 2023-08-27 | DRG: 641 | Disposition: A | Discharge status: Home Health | Payer: Medicare Other | Attending: Internal Medicine | Admitting: Internal Medicine

## 2023-08-16 ENCOUNTER — Observation Stay (HOSPITAL_COMMUNITY): Payer: Medicare Other

## 2023-08-16 DIAGNOSIS — N184 Chronic kidney disease, stage 4 (severe): Secondary | ICD-10-CM | POA: Diagnosis present

## 2023-08-16 DIAGNOSIS — Z23 Encounter for immunization: Secondary | ICD-10-CM

## 2023-08-16 DIAGNOSIS — K5900 Constipation, unspecified: Secondary | ICD-10-CM | POA: Diagnosis not present

## 2023-08-16 DIAGNOSIS — R531 Weakness: Secondary | ICD-10-CM

## 2023-08-16 DIAGNOSIS — N3289 Other specified disorders of bladder: Secondary | ICD-10-CM | POA: Diagnosis present

## 2023-08-16 DIAGNOSIS — E875 Hyperkalemia: Secondary | ICD-10-CM | POA: Diagnosis present

## 2023-08-16 DIAGNOSIS — I129 Hypertensive chronic kidney disease with stage 1 through stage 4 chronic kidney disease, or unspecified chronic kidney disease: Secondary | ICD-10-CM | POA: Diagnosis present

## 2023-08-16 DIAGNOSIS — D649 Anemia, unspecified: Secondary | ICD-10-CM | POA: Diagnosis not present

## 2023-08-16 DIAGNOSIS — M7989 Other specified soft tissue disorders: Secondary | ICD-10-CM | POA: Diagnosis not present

## 2023-08-16 DIAGNOSIS — R339 Retention of urine, unspecified: Secondary | ICD-10-CM | POA: Diagnosis not present

## 2023-08-16 DIAGNOSIS — D509 Iron deficiency anemia, unspecified: Secondary | ICD-10-CM | POA: Diagnosis not present

## 2023-08-16 DIAGNOSIS — E871 Hypo-osmolality and hyponatremia: Principal | ICD-10-CM | POA: Diagnosis present

## 2023-08-16 DIAGNOSIS — Z888 Allergy status to other drugs, medicaments and biological substances status: Secondary | ICD-10-CM | POA: Diagnosis not present

## 2023-08-16 DIAGNOSIS — K0889 Other specified disorders of teeth and supporting structures: Secondary | ICD-10-CM | POA: Diagnosis present

## 2023-08-16 DIAGNOSIS — N390 Urinary tract infection, site not specified: Secondary | ICD-10-CM | POA: Diagnosis not present

## 2023-08-16 DIAGNOSIS — R7401 Elevation of levels of liver transaminase levels: Secondary | ICD-10-CM | POA: Diagnosis not present

## 2023-08-16 DIAGNOSIS — H547 Unspecified visual loss: Secondary | ICD-10-CM | POA: Diagnosis present

## 2023-08-16 DIAGNOSIS — Z87440 Personal history of urinary (tract) infections: Secondary | ICD-10-CM | POA: Diagnosis not present

## 2023-08-16 DIAGNOSIS — Z79899 Other long term (current) drug therapy: Secondary | ICD-10-CM | POA: Diagnosis not present

## 2023-08-16 DIAGNOSIS — E1122 Type 2 diabetes mellitus with diabetic chronic kidney disease: Secondary | ICD-10-CM | POA: Diagnosis not present

## 2023-08-16 DIAGNOSIS — Z743 Need for continuous supervision: Secondary | ICD-10-CM | POA: Diagnosis not present

## 2023-08-16 DIAGNOSIS — I10 Essential (primary) hypertension: Secondary | ICD-10-CM | POA: Diagnosis not present

## 2023-08-16 DIAGNOSIS — E872 Acidosis, unspecified: Secondary | ICD-10-CM | POA: Diagnosis present

## 2023-08-16 DIAGNOSIS — R509 Fever, unspecified: Secondary | ICD-10-CM | POA: Diagnosis not present

## 2023-08-16 DIAGNOSIS — S93401A Sprain of unspecified ligament of right ankle, initial encounter: Secondary | ICD-10-CM | POA: Diagnosis present

## 2023-08-16 DIAGNOSIS — Z6833 Body mass index (BMI) 33.0-33.9, adult: Secondary | ICD-10-CM

## 2023-08-16 DIAGNOSIS — R799 Abnormal finding of blood chemistry, unspecified: Secondary | ICD-10-CM | POA: Diagnosis not present

## 2023-08-16 DIAGNOSIS — M25571 Pain in right ankle and joints of right foot: Secondary | ICD-10-CM | POA: Diagnosis not present

## 2023-08-16 DIAGNOSIS — Z7982 Long term (current) use of aspirin: Secondary | ICD-10-CM

## 2023-08-16 DIAGNOSIS — R7989 Other specified abnormal findings of blood chemistry: Secondary | ICD-10-CM | POA: Diagnosis not present

## 2023-08-16 DIAGNOSIS — M85871 Other specified disorders of bone density and structure, right ankle and foot: Secondary | ICD-10-CM | POA: Diagnosis not present

## 2023-08-16 DIAGNOSIS — E785 Hyperlipidemia, unspecified: Secondary | ICD-10-CM | POA: Diagnosis not present

## 2023-08-16 DIAGNOSIS — R0902 Hypoxemia: Secondary | ICD-10-CM | POA: Diagnosis not present

## 2023-08-16 DIAGNOSIS — N1832 Chronic kidney disease, stage 3b: Secondary | ICD-10-CM | POA: Diagnosis not present

## 2023-08-16 DIAGNOSIS — M19071 Primary osteoarthritis, right ankle and foot: Secondary | ICD-10-CM | POA: Diagnosis not present

## 2023-08-16 DIAGNOSIS — Z713 Dietary counseling and surveillance: Secondary | ICD-10-CM

## 2023-08-16 DIAGNOSIS — E782 Mixed hyperlipidemia: Secondary | ICD-10-CM | POA: Diagnosis not present

## 2023-08-16 DIAGNOSIS — E7800 Pure hypercholesterolemia, unspecified: Secondary | ICD-10-CM | POA: Diagnosis not present

## 2023-08-16 DIAGNOSIS — R6889 Other general symptoms and signs: Secondary | ICD-10-CM | POA: Diagnosis not present

## 2023-08-16 DIAGNOSIS — X501XXA Overexertion from prolonged static or awkward postures, initial encounter: Secondary | ICD-10-CM

## 2023-08-16 DIAGNOSIS — E8809 Other disorders of plasma-protein metabolism, not elsewhere classified: Secondary | ICD-10-CM | POA: Diagnosis not present

## 2023-08-16 DIAGNOSIS — E669 Obesity, unspecified: Secondary | ICD-10-CM | POA: Diagnosis present

## 2023-08-16 LAB — URINALYSIS, ROUTINE W REFLEX MICROSCOPIC
Bilirubin Urine: NEGATIVE
Glucose, UA: NEGATIVE mg/dL
Hgb urine dipstick: NEGATIVE
Ketones, ur: NEGATIVE mg/dL
Leukocytes,Ua: NEGATIVE
Nitrite: NEGATIVE
Protein, ur: NEGATIVE mg/dL
Specific Gravity, Urine: 1.006 (ref 1.005–1.030)
pH: 6 (ref 5.0–8.0)

## 2023-08-16 LAB — COMPREHENSIVE METABOLIC PANEL
ALT: 39 U/L (ref 0–44)
AST: 40 U/L (ref 15–41)
Albumin: 3.7 g/dL (ref 3.5–5.0)
Alkaline Phosphatase: 80 U/L (ref 38–126)
Anion gap: 10 (ref 5–15)
BUN: 16 mg/dL (ref 8–23)
CO2: 20 mmol/L — ABNORMAL LOW (ref 22–32)
Calcium: 9.4 mg/dL (ref 8.9–10.3)
Chloride: 90 mmol/L — ABNORMAL LOW (ref 98–111)
Creatinine, Ser: 1.32 mg/dL — ABNORMAL HIGH (ref 0.44–1.00)
GFR, Estimated: 38 mL/min — ABNORMAL LOW (ref >60)
Glucose, Bld: 104 mg/dL — ABNORMAL HIGH (ref 70–99)
Potassium: 4.5 mmol/L (ref 3.5–5.1)
Sodium: 120 mmol/L — ABNORMAL LOW (ref 135–145)
Total Bilirubin: 1 mg/dL (ref 0.3–1.2)
Total Protein: 7 g/dL (ref 6.5–8.1)

## 2023-08-16 LAB — CBC WITH DIFFERENTIAL/PLATELET
Abs Immature Granulocytes: 0.03 10*3/uL (ref 0.00–0.07)
Basophils Absolute: 0 10*3/uL (ref 0.0–0.1)
Basophils Relative: 0 %
Eosinophils Absolute: 0 10*3/uL (ref 0.0–0.5)
Eosinophils Relative: 0 %
HCT: 29.3 % — ABNORMAL LOW (ref 36.0–46.0)
Hemoglobin: 10.1 g/dL — ABNORMAL LOW (ref 12.0–15.0)
Immature Granulocytes: 0 %
Lymphocytes Relative: 21 %
Lymphs Abs: 1.7 10*3/uL (ref 0.7–4.0)
MCH: 29.5 pg (ref 26.0–34.0)
MCHC: 34.5 g/dL (ref 30.0–36.0)
MCV: 85.7 fL (ref 80.0–100.0)
Monocytes Absolute: 0.7 10*3/uL (ref 0.1–1.0)
Monocytes Relative: 9 %
Neutro Abs: 5.6 10*3/uL (ref 1.7–7.7)
Neutrophils Relative %: 70 %
Platelets: 213 10*3/uL (ref 150–400)
RBC: 3.42 MIL/uL — ABNORMAL LOW (ref 3.87–5.11)
RDW: 14 % (ref 11.5–15.5)
WBC: 8.1 10*3/uL (ref 4.0–10.5)
nRBC: 0 % (ref 0.0–0.2)

## 2023-08-16 LAB — OSMOLALITY, URINE: Osmolality, Ur: 242 mosm/kg — ABNORMAL LOW (ref 300–900)

## 2023-08-16 LAB — MAGNESIUM: Magnesium: 1.9 mg/dL (ref 1.7–2.4)

## 2023-08-16 LAB — SODIUM: Sodium: 124 mmol/L — ABNORMAL LOW (ref 135–145)

## 2023-08-16 LAB — OSMOLALITY: Osmolality: 260 mosm/kg — ABNORMAL LOW (ref 275–295)

## 2023-08-16 LAB — PHOSPHORUS: Phosphorus: 2.9 mg/dL (ref 2.5–4.6)

## 2023-08-16 LAB — TSH: TSH: 2.602 u[IU]/mL (ref 0.350–4.500)

## 2023-08-16 MED ORDER — ASPIRIN 81 MG PO TBEC
81.0000 mg | DELAYED_RELEASE_TABLET | ORAL | Status: DC
  Administered 2023-08-16 – 2023-08-25 (×5): 81 mg via ORAL
  Filled 2023-08-16 (×5): qty 1

## 2023-08-16 MED ORDER — SODIUM CHLORIDE 1 G PO TABS
2.0000 g | ORAL_TABLET | Freq: Three times a day (TID) | ORAL | Status: DC
  Administered 2023-08-16 – 2023-08-17 (×3): 2 g via ORAL
  Filled 2023-08-16 (×4): qty 2

## 2023-08-16 MED ORDER — VITAMIN C 500 MG PO TABS: 1000.0000 mg | ORAL_TABLET | Freq: Every day | ORAL | Status: DC

## 2023-08-16 MED ORDER — OXYCODONE HCL 5 MG PO TABS
5.0000 mg | ORAL_TABLET | Freq: Once | ORAL | Status: AC
  Administered 2023-08-16: 5 mg via ORAL
  Filled 2023-08-16: qty 1

## 2023-08-16 MED ORDER — AMLODIPINE BESYLATE 5 MG PO TABS
10.0000 mg | ORAL_TABLET | Freq: Every day | ORAL | Status: DC
  Administered 2023-08-16 – 2023-08-27 (×12): 10 mg via ORAL
  Filled 2023-08-16 (×12): qty 2

## 2023-08-16 MED ORDER — ACETAMINOPHEN 650 MG RE SUPP: 650.0000 mg | Freq: Four times a day (QID) | RECTAL | Status: DC | PRN

## 2023-08-16 MED ORDER — HYDRALAZINE HCL 20 MG/ML IJ SOLN: 10.0000 mg | INTRAMUSCULAR | Status: DC | PRN

## 2023-08-16 MED ORDER — CENTRUM SILVER ULTRA WOMENS PO TABS: 1.0000 | ORAL_TABLET | Freq: Every day | ORAL | Status: DC

## 2023-08-16 MED ORDER — SIMVASTATIN 20 MG PO TABS
20.0000 mg | ORAL_TABLET | Freq: Every evening | ORAL | Status: DC
  Administered 2023-08-16 – 2023-08-24 (×9): 20 mg via ORAL
  Filled 2023-08-16 (×9): qty 1

## 2023-08-16 MED ORDER — CEPHALEXIN 500 MG PO CAPS
500.0000 mg | ORAL_CAPSULE | Freq: Three times a day (TID) | ORAL | Status: AC
  Administered 2023-08-16 – 2023-08-17 (×4): 500 mg via ORAL
  Filled 2023-08-16 (×4): qty 1

## 2023-08-16 MED ORDER — ADULT MULTIVITAMIN W/MINERALS CH
1.0000 | ORAL_TABLET | Freq: Every day | ORAL | Status: DC
  Administered 2023-08-17 – 2023-08-27 (×11): 1 via ORAL
  Filled 2023-08-16 (×11): qty 1

## 2023-08-16 MED ORDER — CARBOXYMETHYLCELL-GLYCERIN PF 0.5-0.9 % OP SOLN: 1.0000 [drp] | Freq: Every day | OPHTHALMIC | Status: DC

## 2023-08-16 MED ORDER — ENOXAPARIN SODIUM 30 MG/0.3ML IJ SOSY
30.0000 mg | PREFILLED_SYRINGE | INTRAMUSCULAR | Status: DC
  Administered 2023-08-16 – 2023-08-21 (×6): 30 mg via SUBCUTANEOUS
  Filled 2023-08-16 (×6): qty 0.3

## 2023-08-16 MED ORDER — POLYVINYL ALCOHOL 1.4 % OP SOLN: 1.0000 [drp] | OPHTHALMIC | Status: DC | PRN

## 2023-08-16 MED ORDER — VITAMIN C 500 MG PO TABS
1000.0000 mg | ORAL_TABLET | Freq: Every day | ORAL | Status: DC
  Administered 2023-08-17 – 2023-08-27 (×11): 1000 mg via ORAL
  Filled 2023-08-16 (×11): qty 2

## 2023-08-16 MED ORDER — ACETAMINOPHEN 325 MG PO TABS
650.0000 mg | ORAL_TABLET | Freq: Four times a day (QID) | ORAL | Status: DC | PRN
  Administered 2023-08-23 – 2023-08-25 (×4): 650 mg via ORAL
  Filled 2023-08-16 (×4): qty 2

## 2023-08-16 NOTE — ED Notes (Signed)
ED TO INPATIENT HANDOFF REPORT  Name/Age/Gender Joanne Patterson 89 y.o. female  Code Status   Home/SNF/Other Home  Chief Complaint Hyponatremia [E87.1]  Level of Care/Admitting Diagnosis ED Disposition     ED Disposition  Admit   Condition  --   Comment  Hospital Area: [facility] [[id number]]  Level of Care: Progressive [102]  Admit to Progressive based on following criteria: NEPHROLOGY stable condition requiring close monitoring for AKI, requiring Hemodialysis or Peritoneal Dialysis either from expected electrolyte imbalance, acidosis, or fluid overload that can be managed by NIPPV or high flow oxygen.  May place patient in observation at [facility] or [facility] if equivalent level of care is available:: Yes  Covid Evaluation: Asymptomatic - no recent exposure (last 10 days) testing not required  Diagnosis: Hyponatremia [098119]  Admitting Physician: Dorcas Carrow [[id number]]  Attending Physician: Dorcas Carrow [[id number]]          Medical History No past medical history on file.  Allergies Allergies  Allergen Reactions   Diphenhydramine Other (See Comments)    Hallucinations, sleepiness    IV Location/Drains/Wounds Patient Lines/Drains/Airways Status     Active Line/Drains/Airways     Name Placement date Placement time Site Days   Peripheral IV 08/16/23 22 G Left Antecubital 08/16/23  1217  Antecubital  less than 1            Labs/Imaging Results for orders placed or performed during the hospital encounter of 08/16/23 (from the past 48 hour(s))  CBC with Differential     Status: Abnormal   Collection Time: 08/16/23 12:20 PM  Result Value Ref Range   WBC 8.1 4.0 - 10.5 K/uL   RBC 3.42 (L) 3.87 - 5.11 MIL/uL   Hemoglobin 10.1 (L) 12.0 - 15.0 g/dL   HCT 57.8 (L) 46.9 - 62.9 %   MCV 85.7 80.0 - 100.0 fL   MCH 29.5 26.0 - 34.0 pg   MCHC 34.5 30.0 - 36.0 g/dL   RDW 52.8 41.3 - 24.4 %   Platelets 213 150 - 400 K/uL   nRBC 0.0 0.0  - 0.2 %   Neutrophils Relative % 70 %   Neutro Abs 5.6 1.7 - 7.7 K/uL   Lymphocytes Relative 21 %   Lymphs Abs 1.7 0.7 - 4.0 K/uL   Monocytes Relative 9 %   Monocytes Absolute 0.7 0.1 - 1.0 K/uL   Eosinophils Relative 0 %   Eosinophils Absolute 0.0 0.0 - 0.5 K/uL   Basophils Relative 0 %   Basophils Absolute 0.0 0.0 - 0.1 K/uL   Immature Granulocytes 0 %   Abs Immature Granulocytes 0.03 0.00 - 0.07 K/uL    Comment: Performed at [facility], [address], [place], [place] [address]  Comprehensive metabolic panel     Status: Abnormal   Collection Time: 08/16/23 12:20 PM  Result Value Ref Range   Sodium 120 (L) 135 - 145 mmol/L   Potassium 4.5 3.5 - 5.1 mmol/L   Chloride 90 (L) 98 - 111 mmol/L   CO2 20 (L) 22 - 32 mmol/L   Glucose, Bld 104 (H) 70 - 99 mg/dL    Comment: Glucose reference range applies only to samples taken after fasting for at least 8 hours.   BUN 16 8 - 23 mg/dL   Creatinine, Ser 2.53 (H) 0.44 - 1.00 mg/dL   Calcium 9.4 8.9 - 66.4 mg/dL   Total Protein 7.0 6.5 - 8.1 g/dL   Albumin 3.7 3.5 -  5.0 g/dL   AST 40 15 - 41 U/L   ALT 39 0 - 44 U/L   Alkaline Phosphatase 80 38 - 126 U/L   Total Bilirubin 1.0 0.3 - 1.2 mg/dL   GFR, Estimated 38 (L) >60 mL/min    Comment: (NOTE) Calculated using the CKD-EPI Creatinine Equation (2021)    Anion gap 10 5 - 15    Comment: Performed at [facility], [address], [place], [place] [address]  Magnesium     Status: None   Collection Time: 08/16/23 12:20 PM  Result Value Ref Range   Magnesium 1.9 1.7 - 2.4 mg/dL    Comment: Performed at [facility], [address], [place], [place] [address]  Phosphorus     Status: None   Collection Time: 08/16/23 12:20 PM  Result Value Ref Range   Phosphorus 2.9 2.5 - 4.6 mg/dL    Comment: Performed at [facility], [address] [place], [place] [address]  TSH     Status: None   Collection Time: 08/16/23   3:19 PM  Result Value Ref Range   TSH 2.602 0.350 - 4.500 uIU/mL    Comment: Performed by a 3rd Generation assay with a functional sensitivity of <=0.01 uIU/mL. Performed at [facility], [address] [place], [place] [address]   Urinalysis, Routine w reflex microscopic -Urine, Clean Catch     Status: Abnormal   Collection Time: 08/16/23  3:23 PM  Result Value Ref Range   Color, Urine STRAW (A) YELLOW   APPearance CLEAR CLEAR   Specific Gravity, Urine 1.006 1.005 - 1.030   pH 6.0 5.0 - 8.0   Glucose, UA NEGATIVE NEGATIVE mg/dL   Hgb urine dipstick NEGATIVE NEGATIVE   Bilirubin Urine NEGATIVE NEGATIVE   Ketones, ur NEGATIVE NEGATIVE mg/dL   Protein, ur NEGATIVE NEGATIVE mg/dL   Nitrite NEGATIVE NEGATIVE   Leukocytes,Ua NEGATIVE NEGATIVE    Comment: Performed at [facility], [address], [place], [place] [address]   No results found.  Pending Labs Unresulted Labs (From admission, onward)     Start     Ordered   08/17/23 0500  Basic metabolic panel  Tomorrow morning,   R        08/16/23 1606   08/16/23 1430  Osmolality  Once,   R        08/16/23 1430   08/16/23 1346  Osmolality, urine  Once,   URGENT        08/16/23 1345            Vitals/Pain Today's Vitals   08/16/23 1455 08/16/23 1500 08/16/23 1554 08/16/23 1639  BP: (!) 171/76 (!) 190/96    Pulse: 87  84   Resp: 20 15 16    Temp:   97.7 F (36.5 C)   TempSrc:   Oral   SpO2: 100% 98% 100%   Weight:      Height:    5\' 5"  (1.651 m)    Isolation Precautions No active isolations  Medications Medications  amLODipine (NORVASC) tablet 10 mg (10 mg Oral Given 08/16/23 1618)  cephALEXin (KEFLEX) capsule 500 mg (500 mg Oral Given 08/16/23 1618)    Mobility walks with device

## 2023-08-16 NOTE — H&P (Signed)
History and Physical    CHRISTELLA LOWENTHAL HYQ:[id number] DOB: 04/15/1928 DOA: 08/16/2023  PCP: Jarrett Soho, PA-C  Patient coming from: Home  I have personally briefly reviewed patient's old medical records available.   Chief Complaint: Weakness, low sodium at outpatient lab  HPI: Joanne Patterson is a 89 y.o. female with medical history significant of type 2 diabetes currently off medications, essential hypertension on amlodipine and olmesartan hydrochlorothiazide, hyperlipidemia who lives at home with her daughter has been following up for recurrent UTI and bladder spasms, she was found to have sodium level of 120 on follow-up blood test at nephrology office and sent to the emergency room.  Patient is poor historian.  Her daughter and granddaughter at the bedside.  According to the family they have been noticing her having less activities and feeling more weakness for at least few weeks now.  Otherwise she has good appetite, she keeps up her 3 square meals and drinks moderate amount of water and Gatorade. Patient was suffering from bladder spasms and frequent urination and her primary care physician sent her to [facility] Dr. Valentino Nose last month where her sodium was 131.  Renal ultrasound was normal.  She was advised to continue her olmesartan hydrochlorothiazide, however was advised to take Gatorade instead of water.  She also followed up with urology who had continued her on Keflex and suggested to keep plenty of water intake.  She went for routine follow-up blood test yesterday where she was found to have sodium of 121 and nephrologist called her to come to the hospital.  Other than taking Keflex, denies any new medications.  Patient has been encouraged to drink plenty of liquid, however family denies that she is taking any excess water than she used to drink for years. ED Course: Hemodynamically.  On room air. Blood pressures elevated has not taken blood pressure medicine in the morning.   Hemoglobin stable and previous levels.  Sodium 120 and chloride 90.  Bicarb is 20.  Creatinine 1.32 at about her baseline.  Due to significant hyponatremia, admission requested.  Nephrology consulted.  Patient also had twisted her right ankle yesterday at doctor's office. She is also complaining of toothache causing problem with mastication.  Review of Systems: all systems are reviewed and pertinent positive as per HPI otherwise rest are negative.    History reviewed. No pertinent past medical history.  History reviewed. No pertinent surgical history.  Social history   has no history on file for tobacco use, alcohol use, and drug use.  Allergies  Allergen Reactions   Diphenhydramine Other (See Comments)    Hallucinations, sleepiness    History reviewed. No pertinent family history.   Prior to Admission medications   Medication Sig Start Date End Date Taking? Authorizing Provider  ALEVE 220 MG tablet Take 220-440 mg by mouth 2 (two) times daily as needed (for pain).   Yes [provider]  amLODipine (NORVASC) 10 MG tablet Take 10 mg by mouth daily.   Yes [provider]  Ascorbic Acid (VITAMIN C) 1000 MG tablet Take 1,000 mg by mouth daily.   Yes [provider]  aspirin EC 81 MG tablet Take 81 mg by mouth every Monday, Wednesday, and Friday. Swallow whole.   Yes [provider]  cephALEXin (KEFLEX) 500 MG capsule Take 500 mg by mouth every 6 (six) hours. 08/13/23 08/18/23 Yes [provider]  loratadine (CLARITIN) 10 MG tablet Take 10 mg by mouth daily as needed for allergies or rhinitis.  Yes [provider]  Multiple Vitamins-Minerals (CENTRUM SILVER ULTRA WOMENS) TABS Take 1 tablet by mouth daily with breakfast.   Yes [provider]  olmesartan-hydrochlorothiazide (BENICAR HCT) 40-12.5 MG tablet Take 1 tablet by mouth daily.   Yes [provider]  Omega-3 Fatty Acids (FISH OIL) 1000 MG CAPS Take 1,000 mg by  mouth daily.   Yes [provider]  PATADAY 0.2 % SOLN Place 1 drop into both eyes 2 (two) times daily.   Yes [provider]  REFRESH TEARS PF 0.5-0.9 % SOLN Place 1 drop into both eyes daily.   Yes [provider]  simvastatin (ZOCOR) 20 MG tablet Take 20 mg by mouth every evening.   Yes [provider]  TYLENOL 500 MG tablet Take 500-1,000 mg by mouth See admin instructions. Take 1,000 mg by mouth in the morning and an additional 500-1,000 mg by mouth once a day as needed for pain   Yes [provider]    Physical Exam: Vitals:   08/16/23 1455 08/16/23 1500 08/16/23 1554 08/16/23 1639  BP: (!) 171/76 (!) 190/96    Pulse: 87  84   Resp: 20 15 16    Temp:   97.7 F (36.5 C)   TempSrc:   Oral   SpO2: 100% 98% 100%   Weight:      Height:    5\' 5"  (1.651 m)    Constitutional: NAD, calm, comfortable.  Pleasant.  Answers questions appropriately but she is not very interactive.  Defers to her daughters. Vitals:   08/16/23 1455 08/16/23 1500 08/16/23 1554 08/16/23 1639  BP: (!) 171/76 (!) 190/96    Pulse: 87  84   Resp: 20 15 16    Temp:   97.7 F (36.5 C)   TempSrc:   Oral   SpO2: 100% 98% 100%   Weight:      Height:    5\' 5"  (1.651 m)   Eyes: PERRL, lids and conjunctivae normal ENMT: Mucous membranes are moist. Posterior pharynx clear of any exudate or lesions.she has few teeth left, most of them with discoloration and carious teeth. Neck: normal, supple, no masses, no thyromegaly Respiratory: clear to auscultation bilaterally, no wheezing, no crackles. Normal respiratory effort. No accessory muscle use.  Cardiovascular: Regular rate and rhythm, no murmurs / rubs / gallops. No extremity edema. 2+ pedal pulses. No carotid bruits.  Abdomen: no tenderness, no masses palpated. No hepatosplenomegaly. Bowel sounds positive.  Musculoskeletal: no clubbing / cyanosis. No joint deformity upper and lower extremities. Good ROM, no contractures.  Normal muscle tone.  Skin: no rashes, lesions, ulcers. No induration Neurologic: CN 2-12 grossly intact. Sensation intact, DTR normal. Strength 5/5 in all 4.  No tremors. Psychiatric: Normal judgment and insight. Alert and oriented x 3.  Flat affect.    Labs on Admission: I have personally reviewed following labs and imaging studies  CBC: Recent Labs  Lab 08/16/23 1220  WBC 8.1  NEUTROABS 5.6  HGB 10.1*  HCT 29.3*  MCV 85.7  PLT 213   Basic Metabolic Panel: Recent Labs  Lab 08/16/23 1220  NA 120*  K 4.5  CL 90*  CO2 20*  GLUCOSE 104*  BUN 16  CREATININE 1.32*  CALCIUM 9.4  MG 1.9  PHOS 2.9   GFR: Estimated Creatinine Clearance: 29 mL/min (A) (by C-G formula based on SCr of 1.32 mg/dL (H)). Liver Function Tests: Recent Labs  Lab 08/16/23 1220  AST 40  ALT 39  ALKPHOS 80  BILITOT  1.0  PROT 7.0  ALBUMIN 3.7   No results for input(s): "LIPASE", "AMYLASE" in the last 168 hours. No results for input(s): "AMMONIA" in the last 168 hours. Coagulation Profile: No results for input(s): "INR", "PROTIME" in the last 168 hours. Cardiac Enzymes: No results for input(s): "CKTOTAL", "CKMB", "CKMBINDEX", "TROPONINI" in the last 168 hours. BNP (last 3 results) No results for input(s): "PROBNP" in the last 8760 hours. HbA1C: No results for input(s): "HGBA1C" in the last 72 hours. CBG: No results for input(s): "GLUCAP" in the last 168 hours. Lipid Profile: No results for input(s): "CHOL", "HDL", "LDLCALC", "TRIG", "CHOLHDL", "LDLDIRECT" in the last 72 hours. Thyroid Function Tests: Recent Labs    08/16/23 1519  TSH 2.602   Anemia Panel: No results for input(s): "VITAMINB12", "FOLATE", "FERRITIN", "TIBC", "IRON", "RETICCTPCT" in the last 72 hours. Urine analysis:    Component Value Date/Time   COLORURINE STRAW (A) 08/16/2023 1523   APPEARANCEUR CLEAR 08/16/2023 1523   LABSPEC 1.006 08/16/2023 1523   PHURINE 6.0 08/16/2023 1523   GLUCOSEU NEGATIVE 08/16/2023 1523    HGBUR NEGATIVE 08/16/2023 1523   BILIRUBINUR NEGATIVE 08/16/2023 1523   KETONESUR NEGATIVE 08/16/2023 1523   PROTEINUR NEGATIVE 08/16/2023 1523   NITRITE NEGATIVE 08/16/2023 1523   LEUKOCYTESUR NEGATIVE 08/16/2023 1523    Radiological Exams on Admission: No results found.  EKG: Independently reviewed.  Normal sinus rhythm.  Assessment/Plan Principal Problem:   Hyponatremia Active Problems:   Essential hypertension   Hyperlipidemia   CKD (chronic kidney disease), stage IV (HCC)     Symptomatic hyponatremia, currently without acute neurological symptoms: This is likely multifactorial.  Patient also on olmesartan hydrochlorothiazide.  Also possible compulsive water drinking due to frequent UTI/bladder spasms. Agree with admission given severity of symptoms Patient is euvolemic and has adequate oral intake. Regular diet, tolerated mL water restriction. Sodium chloride 2 g 3 times daily. Sodium every 6 hours. Urinalysis, osmolality, TSH, urine sodium pending. Nephrology to follow-up.  2.  Essential hypertension: Blood pressures elevated.  Restart amlodipine.  Discontinue hydrochlorothiazide with olmesartan.  3.  Right ankle sprain: X-ray to rule out any fracture.  Mobilize with PT OT if no fractures.  Can use compression bandages.  4.  Hyperlipidemia: Continue statin.  5.  CKD stage IV: At about baseline.  Under surveillance by nephrology as outpatient.   DVT prophylaxis: Lovenox subcu Code Status: Full code Family Communication: Daughter and granddaughter at the bedside Disposition Plan: Home when clinical improvement, anticipate patient will need home health therapies Consults called: Nephrology, notified Admission status: Progressive, observation.  Need for admission will be assessed every day.   Dorcas Carrow MD [facility]

## 2023-08-16 NOTE — ED Triage Notes (Signed)
Pt BIBA from home where she lives with daughte, as directed by primary HCP due to lab results showing hyponatremia. Pt is A&O x 1

## 2023-08-16 NOTE — ED Provider Notes (Signed)
[facility]ERGENCY DEPARTMENT AT [facility] Provider Note   CSN: [id number] Arrival date & time: 08/16/23  1141     History  Chief Complaint  Patient presents with   hyponatremia   abnormal labs    Joanne Patterson is a 89 y.o. female.  HPI    89 year old patient comes in with chief complaint of hyponatremia. Patient has medical history significant of type 2 diabetes currently off medications, essential hypertension on amlodipine and olmesartan hydrochlorothiazide, hyperlipidemia who lives at home with her daughter has been following up for recurrent UTI and bladder spasms, she was found to have sodium level of 120 on follow-up blood test at nephrology office and sent to the emergency room. Her daughter and granddaughter at the bedside.  According to the family they have been noticing her having less activities and feeling more weakness for at least few weeks now.    Patient was suffering from bladder spasms and frequent urination and her primary care physician sent her to [facility] Dr. Valentino Nose last month where her sodium was 131.  She went for routine follow-up blood test yesterday where she was found to have sodium of 121 and nephrologist called her to come to the hospital.    Home Medications Prior to Admission medications   Medication Sig Start Date End Date Taking? Authorizing Provider  ALEVE 220 MG tablet Take 220-440 mg by mouth 2 (two) times daily as needed (for pain).   Yes [provider]  amLODipine (NORVASC) 10 MG tablet Take 10 mg by mouth daily.   Yes [provider]  Ascorbic Acid (VITAMIN C) 1000 MG tablet Take 1,000 mg by mouth daily.   Yes [provider]  aspirin EC 81 MG tablet Take 81 mg by mouth every Monday, Wednesday, and Friday. Swallow whole.   Yes [provider]  loratadine (CLARITIN) 10 MG tablet Take 10 mg by mouth daily as needed for allergies or rhinitis.   Yes [provider]  Multiple  Vitamins-Minerals (CENTRUM SILVER ULTRA WOMENS) TABS Take 1 tablet by mouth daily with breakfast.   Yes [provider]  olmesartan-hydrochlorothiazide (BENICAR HCT) 40-12.5 MG tablet Take 1 tablet by mouth daily.   Yes [provider]  Omega-3 Fatty Acids (FISH OIL) 1000 MG CAPS Take 1,000 mg by mouth daily.   Yes [provider]  PATADAY 0.2 % SOLN Place 1 drop into both eyes 2 (two) times daily.   Yes [provider]  REFRESH TEARS PF 0.5-0.9 % SOLN Place 1 drop into both eyes daily.   Yes [provider]  simvastatin (ZOCOR) 20 MG tablet Take 20 mg by mouth every evening.   Yes [provider]  TYLENOL 500 MG tablet Take 500-1,000 mg by mouth See admin instructions. Take 1,000 mg by mouth in the morning and an additional 500-1,000 mg by mouth once a day as needed for pain   Yes [provider]      Allergies    Diphenhydramine    Review of Systems   Review of Systems  All other systems reviewed and are negative.   Physical Exam Updated Vital Signs BP (!) 174/69 (BP Location: Left Arm)   Pulse 90   Temp 98.2 F (36.8 C) (Oral)   Resp 18   Ht 5\' 5"  (1.651 m)   Wt 90.1 kg   SpO2 100%   BMI 33.05 kg/m  Physical Exam Vitals and nursing note reviewed.  Constitutional:  Appearance: She is well-developed.  HENT:     Head: Atraumatic.  Eyes:     Extraocular Movements: Extraocular movements intact.     Pupils: Pupils are equal, round, and reactive to light.  Cardiovascular:     Rate and Rhythm: Normal rate.  Pulmonary:     Effort: Pulmonary effort is normal.  Musculoskeletal:     Cervical back: Normal range of motion and neck supple.  Skin:    General: Skin is warm and dry.  Neurological:     Mental Status: She is alert and oriented to person, place, and time.     Cranial Nerves: No cranial nerve deficit.     Sensory: No sensory deficit.     Motor: No weakness.     ED Results / Procedures / Treatments    Labs (all labs ordered are listed, but only abnormal results are displayed) Labs Reviewed  CBC WITH DIFFERENTIAL/PLATELET - Abnormal; Notable for the following components:      Result Value   RBC 3.42 (*)    Hemoglobin 10.1 (*)    HCT 29.3 (*)    All other components within normal limits  COMPREHENSIVE METABOLIC PANEL - Abnormal; Notable for the following components:   Sodium 120 (*)    Chloride 90 (*)    CO2 20 (*)    Glucose, Bld 104 (*)    Creatinine, Ser 1.32 (*)    GFR, Estimated 38 (*)    All other components within normal limits  OSMOLALITY, URINE - Abnormal; Notable for the following components:   Osmolality, Ur 242 (*)    All other components within normal limits  URINALYSIS, ROUTINE W REFLEX MICROSCOPIC - Abnormal; Notable for the following components:   Color, Urine STRAW (*)    All other components within normal limits  OSMOLALITY - Abnormal; Notable for the following components:   Osmolality 260 (*)    All other components within normal limits  SODIUM - Abnormal; Notable for the following components:   Sodium 124 (*)    All other components within normal limits  SODIUM - Abnormal; Notable for the following components:   Sodium 123 (*)    All other components within normal limits  SODIUM - Abnormal; Notable for the following components:   Sodium 121 (*)    All other components within normal limits  GLUCOSE, CAPILLARY - Abnormal; Notable for the following components:   Glucose-Capillary 66 (*)    All other components within normal limits  BASIC METABOLIC PANEL - Abnormal; Notable for the following components:   Sodium 121 (*)    Potassium 6.2 (*)    Chloride 91 (*)    CO2 20 (*)    Creatinine, Ser 1.33 (*)    GFR, Estimated 37 (*)    All other components within normal limits  BASIC METABOLIC PANEL - Abnormal; Notable for the following components:   Sodium 119 (*)    Chloride 88 (*)    Glucose, Bld 105 (*)    Creatinine, Ser 1.45 (*)    GFR, Estimated 34  (*)    All other components within normal limits  SODIUM - Abnormal; Notable for the following components:   Sodium 121 (*)    All other components within normal limits  SODIUM - Abnormal; Notable for the following components:   Sodium 123 (*)    All other components within normal limits  SODIUM - Abnormal; Notable for the following components:   Sodium 124 (*)    All other  components within normal limits  SODIUM - Abnormal; Notable for the following components:   Sodium 126 (*)    All other components within normal limits  BASIC METABOLIC PANEL - Abnormal; Notable for the following components:   Sodium 129 (*)    CO2 21 (*)    Creatinine, Ser 1.25 (*)    GFR, Estimated 40 (*)    All other components within normal limits  BASIC METABOLIC PANEL - Abnormal; Notable for the following components:   Sodium 127 (*)    Creatinine, Ser 1.36 (*)    Calcium 8.8 (*)    GFR, Estimated 36 (*)    All other components within normal limits  RENAL FUNCTION PANEL - Abnormal; Notable for the following components:   Sodium 126 (*)    CO2 20 (*)    Creatinine, Ser 1.38 (*)    Calcium 8.7 (*)    Albumin 2.7 (*)    GFR, Estimated 36 (*)    All other components within normal limits  URINALYSIS, ROUTINE W REFLEX MICROSCOPIC - Abnormal; Notable for the following components:   Hgb urine dipstick LARGE (*)    Leukocytes,Ua TRACE (*)    Bacteria, UA RARE (*)    All other components within normal limits  CBC WITH DIFFERENTIAL/PLATELET - Abnormal; Notable for the following components:   RBC 2.66 (*)    Hemoglobin 7.6 (*)    HCT 23.6 (*)    Monocytes Absolute 1.1 (*)    Abs Immature Granulocytes 0.12 (*)    All other components within normal limits  BASIC METABOLIC PANEL - Abnormal; Notable for the following components:   Sodium 130 (*)    Potassium 6.5 (*)    CO2 19 (*)    Creatinine, Ser 1.38 (*)    GFR, Estimated 36 (*)    All other components within normal limits  RENAL FUNCTION PANEL -  Abnormal; Notable for the following components:   Sodium 133 (*)    Potassium 6.5 (*)    CO2 19 (*)    Creatinine, Ser 1.40 (*)    Albumin 2.8 (*)    GFR, Estimated 35 (*)    All other components within normal limits  GLUCOSE, CAPILLARY - Abnormal; Notable for the following components:   Glucose-Capillary 100 (*)    All other components within normal limits  BASIC METABOLIC PANEL - Abnormal; Notable for the following components:   Sodium 125 (*)    Chloride 96 (*)    BUN 25 (*)    Creatinine, Ser 1.38 (*)    GFR, Estimated 36 (*)    All other components within normal limits  CBC - Abnormal; Notable for the following components:   RBC 2.51 (*)    Hemoglobin 7.4 (*)    HCT 22.1 (*)    All other components within normal limits  BASIC METABOLIC PANEL - Abnormal; Notable for the following components:   Sodium 124 (*)    Chloride 97 (*)    CO2 18 (*)    Glucose, Bld 135 (*)    BUN 24 (*)    Creatinine, Ser 1.58 (*)    GFR, Estimated 30 (*)    All other components within normal limits  VITAMIN B12 - Abnormal; Notable for the following components:   Vitamin B-12 1,001 (*)    All other components within normal limits  IRON AND TIBC - Abnormal; Notable for the following components:   Iron 19 (*)    Saturation Ratios 8 (*)  All other components within normal limits  RETICULOCYTES - Abnormal; Notable for the following components:   RBC. 2.77 (*)    Immature Retic Fract 18.2 (*)    All other components within normal limits  MAGNESIUM  PHOSPHORUS  TSH  GLUCOSE, CAPILLARY  SODIUM, URINE, RANDOM  BRAIN NATRIURETIC PEPTIDE  GLUCOSE, CAPILLARY  CORTISOL-AM, BLOOD  GLUCOSE, CAPILLARY  MAGNESIUM  SODIUM, URINE, RANDOM  OSMOLALITY  OSMOLALITY, URINE  GLUCOSE, CAPILLARY  POTASSIUM  POTASSIUM  POTASSIUM  POTASSIUM  GLUCOSE, CAPILLARY  OSMOLALITY, URINE  FOLATE  FERRITIN  BASIC METABOLIC PANEL  CBC    EKG EKG Interpretation Date/Time:  Wednesday August 16 2023  11:55:37 EDT Ventricular Rate:  76 PR Interval:  194 QRS Duration:  91 QT Interval:  373 QTC Calculation: 420 R Axis:   40  Text Interpretation: Sinus rhythm Abnormal R-wave progression, early transition Confirmed by Lorre Nick ([id number]) on 08/17/2023 2:32:31 PM  Date: 08/22/2023  Rate: 76  Rhythm: normal sinus rhythm  QRS Axis: normal  Intervals: normal  ST/T Wave abnormalities: normal  Conduction Disutrbances: none  Narrative Interpretation: unremarkable     Radiology No results found.  Procedures .Critical Care  Performed by: Derwood Kaplan, MD Authorized by: Derwood Kaplan, MD   Critical care provider statement:    Critical care time (minutes):  35   Critical care was necessary to treat or prevent imminent or life-threatening deterioration of the following conditions:  Endocrine crisis and metabolic crisis (Severe hyponatremia, symptomatic)   Critical care was time spent personally by me on the following activities:  Development of treatment plan with patient or surrogate, discussions with consultants, evaluation of patient's response to treatment, examination of patient, ordering and review of laboratory studies, ordering and review of radiographic studies, ordering and performing treatments and interventions, pulse oximetry, re-evaluation of patient's condition and review of old charts     Medications Ordered in ED Medications  amLODipine (NORVASC) tablet 10 mg (10 mg Oral Given 08/22/23 1106)  aspirin EC tablet 81 mg (81 mg Oral Given 08/21/23 0916)  simvastatin (ZOCOR) tablet 20 mg (20 mg Oral Given 08/22/23 1800)  acetaminophen (TYLENOL) tablet 650 mg (has no administration in time range)    Or  acetaminophen (TYLENOL) suppository 650 mg (has no administration in time range)  hydrALAZINE (APRESOLINE) injection 10 mg (has no administration in time range)  multivitamin with minerals tablet 1 tablet (1 tablet Oral Given 08/22/23 1107)  polyvinyl alcohol  (LIQUIFILM TEARS) 1.4 % ophthalmic solution 1 drop (has no administration in time range)  ascorbic acid (VITAMIN C) tablet 1,000 mg (1,000 mg Oral Given 08/22/23 1107)  oxyCODONE (Oxy IR/ROXICODONE) immediate release tablet 2.5 mg (2.5 mg Oral Given 08/22/23 0249)  senna (SENOKOT) tablet 17.2 mg (17.2 mg Oral Patient Refused/Not Given 08/22/23 1138)  0.9 %  sodium chloride infusion (0 mLs Intravenous Stopped 08/19/23 1205)  trimethoprim (TRIMPEX) tablet 50 mg (50 mg Oral Given 08/22/23 1106)  0.9 %  sodium chloride infusion (0 mLs Intravenous Stopped 08/20/23 1432)  influenza vaccine adjuvanted (FLUAD) injection 0.5 mL (has no administration in time range)  pneumococcal 20-valent conjugate vaccine (PREVNAR 20) injection 0.5 mL (has no administration in time range)  0.9 %  sodium chloride infusion (0 mLs Intravenous Stopped 08/22/23 1543)  Oral care mouth rinse (has no administration in time range)  Oral care mouth rinse (15 mLs Mouth Rinse Given 08/22/23 2100)  Oral care mouth rinse (has no administration in time range)  urea (URE-NA) oral packet 30 g (  30 g Oral Given 08/22/23 1800)  tamsulosin (FLOMAX) capsule 0.4 mg (0.4 mg Oral Given 08/22/23 1800)  cephALEXin (KEFLEX) capsule 500 mg (500 mg Oral Given 08/17/23 1339)  oxyCODONE (Oxy IR/ROXICODONE) immediate release tablet 5 mg (5 mg Oral Given 08/16/23 2029)  tolvaptan (SAMSCA) tablet 15 mg (15 mg Oral Given 08/17/23 1441)  albuterol (PROVENTIL) (2.5 MG/3ML) 0.083% nebulizer solution 10 mg (10 mg Nebulization Given 08/21/23 0743)  insulin aspart (novoLOG) injection 5 Units (5 Units Intravenous Given 08/21/23 0758)    And  dextrose 50 % solution 50 mL (50 mLs Intravenous Given 08/21/23 0758)  sodium zirconium cyclosilicate (LOKELMA) packet 10 g (10 g Oral Given 08/21/23 2150)  iron sucrose (VENOFER) 200 mg in sodium chloride 0.9 % 100 mL IVPB (0 mg Intravenous Stopped 08/22/23 2100)    ED Course/ Medical Decision Making/ A&P                                  Medical Decision Making Amount and/or Complexity of Data Reviewed Labs: ordered.  Risk Decision regarding hospitalization.  89 year old female comes in with chief complaint of abnormal labs.  She has history of hypertension and diabetes.  She had blood work that revealed sodium of 131.  She was advised to get repeat labs to ensure sodium was not dropping, and was found to have sodium of 121.  Patient appears to be symptomatic with it as there has been increased weakness and reduced appetite per patient's family.  Also patient's balance is slightly worse.  It is unclear why patient has hyponatremia.  Differential diagnosis for her includes medication side effects, as patient is on HCTZ.  Additional possibilities include SIADH, paraneoplastic process, renal disorder.  Plan is to proceed with admission request.  Nephrology has been consulted because of the low sodium, medicine will admit.  Final Clinical Impression(s) / ED Diagnoses Final diagnoses:  Acute hyponatremia    Rx / DC Orders ED Discharge Orders     None         Derwood Kaplan, MD 08/22/23 2238

## 2023-08-17 ENCOUNTER — Encounter (HOSPITAL_COMMUNITY): Payer: Self-pay | Admitting: Internal Medicine

## 2023-08-17 DIAGNOSIS — N1832 Chronic kidney disease, stage 3b: Secondary | ICD-10-CM | POA: Diagnosis not present

## 2023-08-17 DIAGNOSIS — E871 Hypo-osmolality and hyponatremia: Secondary | ICD-10-CM | POA: Diagnosis not present

## 2023-08-17 DIAGNOSIS — I129 Hypertensive chronic kidney disease with stage 1 through stage 4 chronic kidney disease, or unspecified chronic kidney disease: Secondary | ICD-10-CM | POA: Diagnosis not present

## 2023-08-17 DIAGNOSIS — N390 Urinary tract infection, site not specified: Secondary | ICD-10-CM | POA: Diagnosis not present

## 2023-08-17 DIAGNOSIS — D649 Anemia, unspecified: Secondary | ICD-10-CM | POA: Diagnosis not present

## 2023-08-17 LAB — BASIC METABOLIC PANEL
Anion gap: 10 (ref 5–15)
BUN: 17 mg/dL (ref 8–23)
CO2: 20 mmol/L — ABNORMAL LOW (ref 22–32)
Calcium: 9.3 mg/dL (ref 8.9–10.3)
Chloride: 91 mmol/L — ABNORMAL LOW (ref 98–111)
Creatinine, Ser: 1.33 mg/dL — ABNORMAL HIGH (ref 0.44–1.00)
GFR, Estimated: 37 mL/min — ABNORMAL LOW (ref >60)
Glucose, Bld: 84 mg/dL (ref 70–99)
Potassium: 6.2 mmol/L — ABNORMAL HIGH (ref 3.5–5.1)
Sodium: 121 mmol/L — ABNORMAL LOW (ref 135–145)

## 2023-08-17 LAB — GLUCOSE, CAPILLARY
Glucose-Capillary: 66 mg/dL — ABNORMAL LOW (ref 70–99)
Glucose-Capillary: 75 mg/dL (ref 70–99)

## 2023-08-17 LAB — SODIUM
Sodium: 123 mmol/L — ABNORMAL LOW (ref 135–145)
Sodium: 121 mmol/L — ABNORMAL LOW (ref 135–145)

## 2023-08-17 LAB — SODIUM, URINE, RANDOM: Sodium, Ur: 42 mmol/L

## 2023-08-17 MED ORDER — SENNA 8.6 MG PO TABS
2.0000 | ORAL_TABLET | Freq: Every day | ORAL | Status: DC
  Administered 2023-08-17 – 2023-08-27 (×9): 17.2 mg via ORAL
  Filled 2023-08-17 (×10): qty 2

## 2023-08-17 MED ORDER — TOLVAPTAN 15 MG PO TABS
15.0000 mg | ORAL_TABLET | Freq: Once | ORAL | Status: AC
  Administered 2023-08-17: 15 mg via ORAL
  Filled 2023-08-17: qty 1

## 2023-08-17 MED ORDER — OXYCODONE HCL 5 MG PO TABS
2.5000 mg | ORAL_TABLET | Freq: Four times a day (QID) | ORAL | Status: DC | PRN
  Administered 2023-08-17 – 2023-08-25 (×9): 2.5 mg via ORAL
  Filled 2023-08-17 (×9): qty 1

## 2023-08-17 MED ORDER — ORAL CARE MOUTH RINSE: 15.0000 mL | OROMUCOSAL | Status: DC | PRN

## 2023-08-17 NOTE — Progress Notes (Signed)
PROGRESS NOTE    Joanne Patterson  NWG:[id number] DOB: [birth year] DOA: 08/16/2023 PCP: Jarrett Soho, PA-C    Brief Narrative:  Patient is a 89 year old with history of diet-controlled type 2 diabetes, hypertension and hyperlipidemia, recurrent UTI and bladder spasms following up at nephrology office and found to have sodium level of 120 on regular checkup.  Patient complained of weakness than usual.  Brought to the ER.  Hemodynamically stable.  Elevated blood pressures.  Sodium 120 on arrival.  Admitted with nephrology consultation.   Assessment & Plan:   Symptomatic hyponatremia, currently without acute neurological symptoms: This is likely multifactorial.  Patient also on olmesartan hydrochlorothiazide.  Also possible compulsive water drinking due to frequent UTI/bladder spasms.  Patient is euvolemic and has adequate oral intake. Regular diet, 1200 mL water restriction. Sodium chloride 2 g 3 times daily. Sodium every 8 hours. Urinalysis, bland  TSH, normal  Sodium without significant improvement.  120-123-124-121 Called and discussed with nephrology.  They will see in consultation.  Currently no indication for 3% saline.   Essential hypertension: Blood pressures elevated.  Restart amlodipine.  Discontinue hydrochlorothiazide with olmesartan.   Right ankle sprain: X-ray negative.  Mobilize with PT OT if no fractures.  Can use compression bandages.  She will benefit with home health.   Hyperlipidemia: Continue statin.   CKD stage IV: At about baseline.  Under surveillance by nephrology as outpatient.  Recurrent UTI: Completing course of Keflex today.   DVT prophylaxis: enoxaparin (LOVENOX) injection 30 mg Start: 08/16/23 2200   Code Status: Full code Family Communication: Daughter at the bedside Disposition Plan: Status is: Observation The patient will require care spanning > 2 midnights and should be moved to inpatient because: Significant hyponatremia      Consultants:  Nephrology  Procedures:  None  Antimicrobials:  None   Subjective: Patient seen and examined.  Her daughter was at the bedside.  Patient was trying to work with occupational therapy.  She looks more comfortable.  Patient herself tells me she feels better today.  Family was happy to see her more mobilizing.  Repeat sodium 121.  Denies any nausea vomiting.  Objective: Vitals:   08/17/23 0620 08/17/23 0630 08/17/23 0747 08/17/23 0946  BP: (!) 135/53  (!) 132/46 (!) 135/58  Pulse: 77  74   Resp: 16  18   Temp: 97.7 F (36.5 C)  98 F (36.7 C)   TempSrc: Oral  Oral   SpO2: 100%  100%   Weight:  87 kg    Height:        Intake/Output Summary (Last 24 hours) at 08/17/2023 1149 Last data filed at 08/17/2023 0900 Gross per 24 hour  Intake 720 ml  Output --  Net 720 ml   Filed Weights   08/16/23 1154 08/17/23 0630  Weight: 87.3 kg 87 kg    Examination:  General exam: Appears calm and comfortable  Working with PT. Respiratory system: Clear to auscultation. Respiratory effort normal.  No added sounds. Cardiovascular system: S1 & S2 heard, RRR.  No edema.   Gastrointestinal system: Soft.  Nontender.  Bowel sound present. Central nervous system: Alert and oriented. No focal neurological deficits.  Patient does have poor vision.  She has generalized weakness but no focal deficits.    Data Reviewed: I have personally reviewed following labs and imaging studies  CBC: Recent Labs  Lab 08/16/23 1220  WBC 8.1  NEUTROABS 5.6  HGB 10.1*  HCT 29.3*  MCV 85.7  PLT 213  Basic Metabolic Panel: Recent Labs  Lab 08/16/23 1220 08/16/23 2205 08/17/23 0229 08/17/23 0827  NA 120* 124* 123* 121*  K 4.5  --   --   --   CL 90*  --   --   --   CO2 20*  --   --   --   GLUCOSE 104*  --   --   --   BUN 16  --   --   --   CREATININE 1.32*  --   --   --   CALCIUM 9.4  --   --   --   MG 1.9  --   --   --   PHOS 2.9  --   --   --    GFR: Estimated  Creatinine Clearance: 29 mL/min (A) (by C-G formula based on SCr of 1.32 mg/dL (H)). Liver Function Tests: Recent Labs  Lab 08/16/23 1220  AST 40  ALT 39  ALKPHOS 80  BILITOT 1.0  PROT 7.0  ALBUMIN 3.7   No results for input(s): "LIPASE", "AMYLASE" in the last 168 hours. No results for input(s): "AMMONIA" in the last 168 hours. Coagulation Profile: No results for input(s): "INR", "PROTIME" in the last 168 hours. Cardiac Enzymes: No results for input(s): "CKTOTAL", "CKMB", "CKMBINDEX", "TROPONINI" in the last 168 hours. BNP (last 3 results) No results for input(s): "PROBNP" in the last 8760 hours. HbA1C: No results for input(s): "HGBA1C" in the last 72 hours. CBG: Recent Labs  Lab 08/17/23 0740 08/17/23 0807  GLUCAP 66* 75   Lipid Profile: No results for input(s): "CHOL", "HDL", "LDLCALC", "TRIG", "CHOLHDL", "LDLDIRECT" in the last 72 hours. Thyroid Function Tests: Recent Labs    08/16/23 1519  TSH 2.602   Anemia Panel: No results for input(s): "VITAMINB12", "FOLATE", "FERRITIN", "TIBC", "IRON", "RETICCTPCT" in the last 72 hours. Sepsis Labs: No results for input(s): "PROCALCITON", "LATICACIDVEN" in the last 168 hours.  No results found for this or any previous visit (from the past 240 hour(s)).       Radiology Studies: DG Ankle 2 Views Right  Result Date: 08/16/2023 CLINICAL DATA:  Right ankle pain.  No known injury. EXAM: RIGHT ANKLE - 2 VIEW COMPARISON:  None Available. FINDINGS: There is diffuse osteopenia of the visualized osseous structures. No acute fracture or dislocation. No aggressive osseous lesion. Ankle mortise appears intact. Mild diffuse degenerative changes of imaged joints. There is asymmetric moderate soft tissue swelling overlying the lateral malleolus without discrete underlying fracture, concerning for soft tissue/ligamentous injury. No radiopaque foreign bodies. IMPRESSION: 1. Asymmetric moderate soft tissue swelling overlying the lateral  malleolus without discrete underlying fracture, concerning for soft tissue/ligamentous injury. Electronically Signed   By: Jules Schick M.D.   On: 08/16/2023 18:30        Scheduled Meds:  amLODipine  10 mg Oral Daily   ascorbic acid  1,000 mg Oral Daily   aspirin EC  81 mg Oral Q M,W,F   cephALEXin  500 mg Oral Q8H   enoxaparin (LOVENOX) injection  30 mg Subcutaneous Q24H   multivitamin with minerals  1 tablet Oral Daily   simvastatin  20 mg Oral QPM   sodium chloride  2 g Oral TID WC   Continuous Infusions:   LOS: 0 days    Time spent: 50 minutes    Dorcas Carrow, MD [facility]

## 2023-08-17 NOTE — Evaluation (Signed)
Occupational Therapy Evaluation Patient Details Name: Joanne Patterson MRN: [id number] DOB: 16-Dec-1927 Today's Date: 08/17/2023   History of Present Illness 89 y.o. female with medical history significant of type 2 diabetes currently off medications, essential hypertension on amlodipine and olmesartan hydrochlorothiazide, hyperlipidemia who lives at home with her daughter has been following up for recurrent UTI and bladder spasms, she was found to have sodium level of 120 on follow-up blood test at nephrology office and sent to the emergency room. Rolled ankle at nephrology office and sustained a R ankle sprain.   Clinical Impression   Pt admitted with the above diagnosis. Pt currently with functional limitations due to the deficits listed below (see OT Problem List). Prior to admit, pt was living at home with her daughter assisting with BADL tasks and functional mobility. Pt utilized RW for functional mobility. Daughter reports a gradual decline in functional performance with increased falls.  Pt will benefit from acute skilled OT to increase their safety and independence with ADL and functional mobility for ADL to facilitate discharge. Patient will benefit from continued inpatient follow up therapy, <3 hours/day. OT will continue to follow patient acutely.          If plan is discharge home, recommend the following: A lot of help with bathing/dressing/bathroom;A lot of help with walking and/or transfers;Assistance with feeding;Help with stairs or ramp for entrance;Assist for transportation;Supervision due to cognitive status    Functional Status Assessment  Patient has had a recent decline in their functional status and demonstrates the ability to make significant improvements in function in a reasonable and predictable amount of time.  Equipment Recommendations  Other (comment) (defer to next venue of care)       Precautions / Restrictions Precautions Precautions: Fall Precaution  Comments: R ankle sprain Restrictions Weight Bearing Restrictions: No      Mobility Bed Mobility Overal bed mobility: Needs Assistance Bed Mobility: Supine to Sit     Supine to sit: Min assist, HOB elevated     General bed mobility comments: Increased time required to completed task. VC and tactile cues provided for sequencing.    Transfers Overall transfer level: Needs assistance Equipment used: Rolling walker (2 wheels) Transfers: Sit to/from Stand, Bed to chair/wheelchair/BSC Sit to Stand: Mod assist, From elevated surface     Step pivot transfers: Min assist     General transfer comment: VC for hand placement with RW management prior to sit to stand technique.      Balance Overall balance assessment: History of Falls, Mild deficits observed, not formally tested              ADL either performed or assessed with clinical judgement   ADL Overall ADL's : Needs assistance/impaired     Grooming: Minimal assistance;Sitting   Upper Body Bathing: Minimal assistance;Sitting   Lower Body Bathing: Total assistance;Bed level   Upper Body Dressing : Moderate assistance;Sitting   Lower Body Dressing: Total assistance;Bed level   Toilet Transfer: Moderate assistance;Stand-pivot;Rolling walker (2 wheels) Toilet Transfer Details (indicate cue type and reason): simulated Toileting- Clothing Manipulation and Hygiene: Total assistance;Sit to/from stand       Functional mobility during ADLs: Moderate assistance;Cueing for sequencing;Cueing for safety;Rolling walker (2 wheels)       Vision Baseline Vision/History: 5 Retinopathy;4 Cataracts Ability to See in Adequate Light: 1 Impaired Patient Visual Report: No change from baseline Additional Comments: Limited vision which flucuates per daughter. Pt was able to complete all functional tasks during session.  Perception Perception: Not tested       Praxis Praxis: Not tested       Pertinent Vitals/Pain Pain  Assessment Pain Assessment: Faces Faces Pain Scale: Hurts little more Pain Location: B ankle and knee joints when mobilized during session Pain Descriptors / Indicators: Grimacing, Guarding Pain Intervention(s): Monitored during session, Limited activity within patient's tolerance, Repositioned     Extremity/Trunk Assessment Upper Extremity Assessment Upper Extremity Assessment: Generalized weakness   Lower Extremity Assessment Lower Extremity Assessment: Generalized weakness;RLE deficits/detail RLE Deficits / Details: recent right ankle sprain   Cervical / Trunk Assessment Cervical / Trunk Assessment: Other exceptions Cervical / Trunk Exceptions: body habitus, rounded shoulder, forward head   Communication Communication Communication: Hearing impairment;Difficulty following commands/understanding Following commands: Follows one step commands with increased time Cueing Techniques: Verbal cues;Tactile cues   Cognition Arousal: Alert Behavior During Therapy: Flat affect Overall Cognitive Status: History of cognitive impairments - at baseline Area of Impairment: Orientation, Attention, Memory, Following commands, Safety/judgement, Awareness, Problem solving        Orientation Level: Person Current Attention Level: Focused Memory: Decreased short-term memory Following Commands: Follows one step commands with increased time Safety/Judgement: Decreased awareness of deficits, Decreased awareness of safety Awareness:  (impaired) Problem Solving: Slow processing, Decreased initiation, Difficulty sequencing, Requires verbal cues, Requires tactile cues       General Comments  VSS on RA            Home Living Family/patient expects to be discharged to:: Private residence Living Arrangements: Children (adult daughter) Available Help at Discharge: Family;Available 24 hours/day Type of Home: House Home Access: Stairs to enter [employer] of Steps: back entrance -  2 Entrance Stairs-Rails: Right;Left;Can reach both Home Layout: One level     Bathroom Shower/Tub: Other (comment) (modified walk in tub/shower)   Bathroom Toilet: Handicapped height Bathroom Accessibility: Yes How Accessible: Accessible via walker Home Equipment: Rolling Walker (2 wheels);Shower seat;Grab bars - toilet;Grab bars - tub/shower   Additional Comments: ~4 falls in the past 6 months      Prior Functioning/Environment Prior Level of Function : History of Falls (last six months)        Mobility Comments: Has had more difficulty navigating stairs now. Recently started using a RW which has really helped with her balance. Her legs have been getting weaker and they will give out with warning. ADLs Comments: Daughter is assisting more with bathing and dressing (closer to 60%) . Pt is able to don shirt.        OT Problem List: Decreased strength;Decreased coordination;Pain;Decreased activity tolerance;Decreased safety awareness;Impaired balance (sitting and/or standing)      OT Treatment/Interventions: Self-care/ADL training;Therapeutic exercise;Therapeutic activities;Cognitive remediation/compensation;Energy conservation;Patient/family education;DME and/or AE instruction;Manual therapy;Balance training    OT Goals(Current goals can be found in the care plan section) Acute Rehab OT Goals Patient Stated Goal: none stated OT Goal Formulation: Patient unable to participate in goal setting Time For Goal Achievement: 08/31/23 Potential to Achieve Goals: Fair  OT Frequency: Min 1X/week       AM-PAC OT "6 Clicks" Daily Activity     Outcome Measure Help from another person eating meals?: A Little Help from another person taking care of personal grooming?: A Little Help from another person toileting, which includes using toliet, bedpan, or urinal?: Total Help from another person bathing (including washing, rinsing, drying)?: Total Help from another person to put on and taking  off regular upper body clothing?: A Lot Help from another person to put on and taking  off regular lower body clothing?: Total 6 Click Score: 11   End of Session Equipment Utilized During Treatment: Gait belt;Rolling walker (2 wheels) Nurse Communication: Mobility status  Activity Tolerance: Patient tolerated treatment well Patient left: in chair;with call bell/phone within reach;with chair alarm set;with family/visitor present  OT Visit Diagnosis: Unsteadiness on feet (R26.81);Muscle weakness (generalized) (M62.81);History of falling (Z91.81)                Time: 1003-1036 OT Time Calculation (min): 33 min Charges:  OT General Charges $OT Visit: 1 Visit OT Evaluation $OT Eval Moderate Complexity: 1 Mod OT Treatments $Self Care/Home Management : 8-22 mins  Limmie Patricia, OTR/L,CBIS  Supplemental OT - [facility] and [facility] Secure Chat Preferred    Dereon Williamsen, Charisse March 08/17/2023, 10:52 AM

## 2023-08-17 NOTE — TOC Initial Note (Signed)
Transition of Care [facility]) - Initial/Assessment Note    Patient Details  Name: Joanne Patterson MRN: [id number] Date of Birth: Feb 08, 1928  [facility]) CM/SW Contact:    Joanne Clam, RN Phone Number: 08/17/2023, 10:13 AM  Clinical Narrative:d/c plan home. Left vm w/Joanne Patterson(dtr) await call back.                   Expected Discharge Plan: Home/Self Care Barriers to Discharge: Continued Medical Work up   Patient Goals and CMS Choice Patient states their goals for this hospitalization and ongoing recovery are:: Home CMS Medicare.gov Compare Post Acute Care list provided to:: Patient Represenative (must comment) (Joanne Patterson(dtr)) Choice offered to / list presented to : Adult Children [place] ownership interest in [facility].provided to:: Adult Children    Expected Discharge Plan and Services   Discharge Planning Services: CM Consult Post Acute Care Choice: Resumption of Svcs/PTA Provider Living arrangements for the past 2 months: Single Family Home                                      Prior Living Arrangements/Services Living arrangements for the past 2 months: Single Family Home Lives with:: Adult Children Patient language and need for interpreter reviewed:: Yes Do you feel safe going back to the place where you live?: Yes      Need for Family Participation in Patient Care: Yes (Comment) Care giver support system in place?: Yes (comment)   Criminal Activity/Legal Involvement Pertinent to Current Situation/Hospitalization: No - Comment as needed  Activities of Daily Living   ADL Screening (condition at time of admission) Independently performs ADLs?: No Does the patient have a NEW difficulty with bathing/dressing/toileting/self-feeding that is expected to last >3 days?: No Does the patient have a NEW difficulty with getting in/out of bed, walking, or climbing stairs that is expected to last >3 days?: No Does the patient have a NEW difficulty  with communication that is expected to last >3 days?: No Is the patient deaf or have difficulty hearing?: Yes Does the patient have difficulty seeing, even when wearing glasses/contacts?: No Does the patient have difficulty concentrating, remembering, or making decisions?: Yes  Permission Sought/Granted Permission sought to share information with : Case Manager Permission granted to share information with : Yes, Verbal Permission Granted  Share Information with NAME: Case manager           Emotional Assessment Appearance:: Appears stated age Attitude/Demeanor/Rapport: Gracious Affect (typically observed): Accepting Orientation: : Oriented to Self, Oriented to Place, Oriented to  Time, Oriented to Situation Alcohol / Substance Use: Not Applicable Psych Involvement: No (comment)  Admission diagnosis:  Hyponatremia [E87.1] Patient Active Problem List   Diagnosis Date Noted   Hyponatremia 08/16/2023   Essential hypertension 08/16/2023   Hyperlipidemia 08/16/2023   CKD (chronic kidney disease), stage IV (HCC) 08/16/2023   PCP:  Joanne Soho, PA-C Pharmacy:   [id number] [place], [place] - [address] AT [facility] & [address] [place] [address] Phone: [phone] Fax: [phone]     Social Determinants of Health (SDOH) Social History: SDOH Screenings   Food Insecurity: No Food Insecurity (08/17/2023)  Housing: Low Risk  (08/17/2023)  Transportation Needs: No Transportation Needs (08/17/2023)  Utilities: Not At Risk (08/17/2023)   SDOH Interventions:     Readmission Risk Interventions  No data to display

## 2023-08-17 NOTE — Consult Note (Addendum)
Renal Service Consult Note [facility]  Joanne Patterson 08/17/2023 Joanne Krabbe, MD Requesting Physician: Dr. Jerral Patterson, K.   Reason for Consult: Hyponatremia HPI: The patient is a 89 y.o. year-old w/ PMH as below who presented to ED for low sodium level of 121 (usual 131) drawn by her kidney doctor, Dr Joanne Patterson. In ED BP's were a bit high, Hb stable. Na= 120, bicarb 20, creat 1.32. Pt was admitted and started on FR 1000 cc and salt tabs. Today her Na+ is up to 123. We are asked to see for hyponatremia.   Pt seen in room. Daughters at the bedside. Pt having leg cramps and other muscles are cramping. No confusion. No hx CHF or cirrhosis. No prior admit here.    ROS - poor historian  Past Medical History History reviewed. No pertinent past medical history. Past Surgical History History reviewed. No pertinent surgical history. Family History History reviewed. No pertinent family history. Social History  reports that she has never smoked. She has never used smokeless tobacco. No history on file for alcohol use and drug use. Allergies  Allergies  Allergen Reactions   Diphenhydramine Other (See Comments)    Hallucinations, sleepiness   Home medications Prior to Admission medications   Medication Sig Start Date End Date Taking? Authorizing Provider  ALEVE 220 MG tablet Take 220-440 mg by mouth 2 (two) times daily as needed (for pain).   Yes [provider]  amLODipine (NORVASC) 10 MG tablet Take 10 mg by mouth daily.   Yes [provider]  Ascorbic Acid (VITAMIN C) 1000 MG tablet Take 1,000 mg by mouth daily.   Yes [provider]  aspirin EC 81 MG tablet Take 81 mg by mouth every Monday, Wednesday, and Friday. Swallow whole.   Yes [provider]  cephALEXin (KEFLEX) 500 MG capsule Take 500 mg by mouth every 6 (six) hours. 08/13/23 08/18/23 Yes [provider]  loratadine (CLARITIN) 10 MG tablet Take 10 mg by mouth daily as  needed for allergies or rhinitis.   Yes [provider]  Multiple Vitamins-Minerals (CENTRUM SILVER ULTRA WOMENS) TABS Take 1 tablet by mouth daily with breakfast.   Yes [provider]  olmesartan-hydrochlorothiazide (BENICAR HCT) 40-12.5 MG tablet Take 1 tablet by mouth daily.   Yes [provider]  Omega-3 Fatty Acids (FISH OIL) 1000 MG CAPS Take 1,000 mg by mouth daily.   Yes [provider]  PATADAY 0.2 % SOLN Place 1 drop into both eyes 2 (two) times daily.   Yes [provider]  REFRESH TEARS PF 0.5-0.9 % SOLN Place 1 drop into both eyes daily.   Yes [provider]  simvastatin (ZOCOR) 20 MG tablet Take 20 mg by mouth every evening.   Yes [provider]  TYLENOL 500 MG tablet Take 500-1,000 mg by mouth See admin instructions. Take 1,000 mg by mouth in the morning and an additional 500-1,000 mg by mouth once a day as needed for pain   Yes [provider]      Exam No rash, cyanosis or gangrene.  Elderly AAF, chronically ill appearing Sclera anicteric, throat clear  No jvd or bruits Chest clear bilat to bases, no rales/ wheezing RRR no RG Abd soft obese ntnd no mass or ascites +bs GU defer MS no joint effusions or deformity Ext no LE or UE edema, puffy calves/ ankles but not edema Neuro is alert, gen'd weakness, pleasant     Renal-related home meds: -  aleve - novasc 10 - olmesartan- hydrochlorothiazide - others: statin, keflex, asa, vit/ prns     Date   Creat  eGFR   01/16/23  1.72   07/27/23  1.74   08/15/23    1.43   08/16/23  1.32  38   08/17/23  1.33  37 ml/min   VS: 135/ 53   HR 75  RR 18  temop 97.5        RA 100%        Na+ 121  K+ 6.2 Cl 91  CO20  BUN 17  creat 1.33          Alb 3.7   Mg 1.9  phos 2.9  LFT's ok  HB 10  wBC 8K      UA negative      UOsm - 242      UNa - pend    Assessment/ Plan: Hyponatremia - in pt w/ HTN on hydrochlorothiazide and ARB. Pt looks euvolemic, no edema  and no hx CHF/ cirrhosis.  Has stage 3 renal failure. Will check TSH and cortisol. Suspect this is medication-related due to hydrochlorothiazide and possibly also ARB effects. NaCl tabs were started last night and Na+ improved from 120 to 124 this am.  Will hold salt tabs and give tolvaptan 15mg  x 1. Will follow.  CKD 3b - b/l creat 1.3, eGFR 37 ml/min from this admission. Labs from [facility] earlier this year showed creat 1.4- 1.7 range.  Recurrent UTI's - per pmd       Joanne Moselle  MD [facility] 08/17/2023, 2:00 PM  Recent Labs  Lab 08/16/23 1220 08/17/23 1213  HGB 10.1*  --   ALBUMIN 3.7  --   CALCIUM 9.4 9.3  PHOS 2.9  --   CREATININE 1.32* 1.33*  K 4.5 6.2*   Inpatient medications:  amLODipine  10 mg Oral Daily   ascorbic acid  1,000 mg Oral Daily   aspirin EC  81 mg Oral Q M,W,F   enoxaparin (LOVENOX) injection  30 mg Subcutaneous Q24H   multivitamin with minerals  1 tablet Oral Daily   simvastatin  20 mg Oral QPM   sodium chloride  2 g Oral TID WC    acetaminophen **OR** acetaminophen, hydrALAZINE, mouth rinse, polyvinyl alcohol

## 2023-08-17 NOTE — Plan of Care (Signed)
  Problem: Health Behavior/Discharge Planning: Goal: Ability to manage health-related needs will improve Outcome: Progressing   Problem: Clinical Measurements: Goal: Diagnostic test results will improve Outcome: Progressing   Problem: Activity: Goal: Risk for activity intolerance will decrease Outcome: Progressing   Problem: Safety: Goal: Ability to remain free from injury will improve Outcome: Progressing   Problem: Skin Integrity: Goal: Risk for impaired skin integrity will decrease Outcome: Progressing

## 2023-08-17 NOTE — Evaluation (Signed)
Physical Therapy Evaluation Patient Details Name: Joanne Patterson MRN: [id number] DOB: 05/30/1928 Today's Date: 08/17/2023  History of Present Illness  89 y.o. female with medical history significant of type 2 diabetes currently off medications, essential hypertension on amlodipine and olmesartan hydrochlorothiazide, hyperlipidemia who lives at home with her daughter has been following up for recurrent UTI and bladder spasms, she was found to have sodium level of 120 on follow-up blood test at nephrology office and sent to the emergency room. Rolled ankle at nephrology office and sustained a R ankle sprain.  Clinical Impression  Pt admitted with above diagnosis. Pt cooperative with PT, able to amb short distance with min to mod assist, fatigues easily. Family reports multiple recent  falls.   Patient will benefit from continued inpatient follow up therapy, <3 hours/day.  Pt currently with functional limitations due to the deficits listed below (see PT Problem List). Pt will benefit from acute skilled PT to increase their independence and safety with mobility to allow discharge.           If plan is discharge home, recommend the following: A lot of help with walking and/or transfers;A lot of help with bathing/dressing/bathroom;Help with stairs or ramp for entrance;Assist for transportation;Assistance with cooking/housework;Direct supervision/assist for financial management;Supervision due to cognitive status;Direct supervision/assist for medications management   Can travel by private vehicle   No    Equipment Recommendations None recommended by PT  Recommendations for Other Services       Functional Status Assessment Patient has had a recent decline in their functional status and demonstrates the ability to make significant improvements in function in a reasonable and predictable amount of time.     Precautions / Restrictions Precautions Precautions: Fall Precaution Comments: R ankle  sprain; per family knees "give out" Restrictions Weight Bearing Restrictions: No      Mobility  Bed Mobility               General bed mobility comments: OOB with OT    Transfers Overall transfer level: Needs assistance Equipment used: Rolling walker (2 wheels) Transfers: Sit to/from Stand Sit to Stand: Mod assist, +2 physical assistance, +2 safety/equipment, Min assist           General transfer comment: VC for hand placement with RW management prior to sit to stand technique.    Ambulation/Gait Ambulation/Gait assistance: Min to mod  assist, +2 safety/equipment Gait Distance (Feet): 15 Feet Assistive device: Rolling walker (2 wheels) Gait Pattern/deviations: Step-to pattern, Decreased step length - right, Wide base of support, Trunk flexed       General Gait Details: multi-modal cues for RW position, to incr step length R, maintains  RLE in external rotation; assist to balance throughout, R lateral lean end of distance  Stairs            Wheelchair Mobility     Tilt Bed    Modified Rankin (Stroke Patients Only)       Balance Overall balance assessment: History of Falls, Needs assistance Sitting-balance support: Feet supported Sitting balance-Leahy Scale: Fair     Standing balance support: During functional activity, Reliant on assistive device for balance Standing balance-Leahy Scale: Poor                               Pertinent Vitals/Pain Pain Assessment Pain Assessment: Faces Faces Pain Scale: Hurts a little bit Pain Location: B ankle and knee joints when mobilized during session,  mostly with initial motion Pain Descriptors / Indicators: Grimacing, Guarding Pain Intervention(s): Limited activity within patient's tolerance, Monitored during session, Repositioned    Home Living Family/patient expects to be discharged to:: Skilled nursing facility Living Arrangements: Children Available Help at Discharge: Family;Available  24 hours/day Type of Home: House Home Access: Stairs to enter Entrance Stairs-Rails: Right;Left;Can reach both Entrance Stairs-Number of Steps: back entrance - 2   Home Layout: One level Home Equipment: Agricultural consultant (2 wheels);Shower seat;Grab bars - toilet;Grab bars - tub/shower Additional Comments: ~4 falls in the past 6 months ( pt very fearful of falling per family)    Prior Function Prior Level of Function : History of Falls (last six months)             Mobility Comments: Has had more difficulty navigating stairs now. Recently started using a RW which has really helped with her balance. Her legs have been getting weaker and they will give out with warning. ADLs Comments: Daughter is assisting more with bathing and dressing (closer to 60%) . Pt is able to don shirt.     Extremity/Trunk Assessment   Upper Extremity Assessment Upper Extremity Assessment: Defer to OT evaluation;Generalized weakness    Lower Extremity Assessment Lower Extremity Assessment: Generalized weakness RLE Deficits / Details: recent right ankle sprain    Cervical / Trunk Assessment Cervical / Trunk Assessment: Other exceptions Cervical / Trunk Exceptions: body habitus, rounded shoulder, forward head  Communication   Communication Communication: Hearing impairment;Difficulty following commands/understanding Following commands: Follows one step commands with increased time Cueing Techniques: Verbal cues;Tactile cues  Cognition Arousal: Alert Behavior During Therapy: WFL for tasks assessed/performed Overall Cognitive Status: History of cognitive impairments - at baseline Area of Impairment: Orientation, Attention, Memory, Following commands, Safety/judgement, Awareness, Problem solving                 Orientation Level: Person Current Attention Level: Focused   Following Commands: Follows one step commands with increased time Safety/Judgement: Decreased awareness of deficits, Decreased  awareness of safety   Problem Solving: Slow processing, Decreased initiation, Difficulty sequencing, Requires verbal cues, Requires tactile cues General Comments: family providing cues and pt more responsive to direction from dtr        General Comments General comments (skin integrity, edema, etc.): VSS on RA    Exercises     Assessment/Plan    PT Assessment Patient needs continued PT services  PT Problem List Decreased strength;Decreased range of motion;Decreased activity tolerance;Decreased balance;Decreased mobility;Pain;Decreased knowledge of use of DME;Decreased knowledge of precautions;Decreased safety awareness;Decreased cognition       PT Treatment Interventions DME instruction;Gait training;Functional mobility training;Therapeutic activities;Therapeutic exercise;Patient/family education;Stair training    PT Goals (Current goals can be found in the Care Plan section)  Acute Rehab PT Goals PT Goal Formulation: With patient Time For Goal Achievement: 08/31/23 Potential to Achieve Goals: Good    Frequency Min 1X/week     Co-evaluation               AM-PAC PT "6 Clicks" Mobility  Outcome Measure Help needed turning from your back to your side while in a flat bed without using bedrails?: A Lot Help needed moving from lying on your back to sitting on the side of a flat bed without using bedrails?: A Lot Help needed moving to and from a bed to a chair (including a wheelchair)?: A Lot Help needed standing up from a chair using your arms (e.g., wheelchair or bedside chair)?: A Lot Help needed  to walk in hospital room?: Total Help needed climbing 3-5 steps with a railing? : Total 6 Click Score: 10    End of Session Equipment Utilized During Treatment: Gait belt Activity Tolerance: Patient tolerated treatment well Patient left: with call bell/phone within reach;in chair;with family/visitor present   PT Visit Diagnosis: Other abnormalities of gait and mobility  (R26.89);Difficulty in walking, not elsewhere classified (R26.2)    Time: 1206-1229 PT Time Calculation (min) (ACUTE ONLY): 23 min   Charges:   PT Evaluation $PT Eval Low Complexity: 1 Low PT Treatments $Gait Training: 8-22 mins PT General Charges $$ ACUTE PT VISIT: 1 Visit         Azyria Osmon, PT  Acute Rehab Dept [facility]) [phone]  08/17/2023   [facility] 08/17/2023, 1:51 PM

## 2023-08-17 NOTE — Progress Notes (Signed)
MD aware of K+ 6.2. Maintain current plan of care

## 2023-08-18 ENCOUNTER — Observation Stay (HOSPITAL_COMMUNITY): Payer: Medicare Other

## 2023-08-18 DIAGNOSIS — Z79899 Other long term (current) drug therapy: Secondary | ICD-10-CM | POA: Diagnosis not present

## 2023-08-18 DIAGNOSIS — E8809 Other disorders of plasma-protein metabolism, not elsewhere classified: Secondary | ICD-10-CM | POA: Diagnosis not present

## 2023-08-18 DIAGNOSIS — M7989 Other specified soft tissue disorders: Secondary | ICD-10-CM | POA: Diagnosis not present

## 2023-08-18 DIAGNOSIS — Z7982 Long term (current) use of aspirin: Secondary | ICD-10-CM | POA: Diagnosis not present

## 2023-08-18 DIAGNOSIS — D509 Iron deficiency anemia, unspecified: Secondary | ICD-10-CM | POA: Diagnosis not present

## 2023-08-18 DIAGNOSIS — N1832 Chronic kidney disease, stage 3b: Secondary | ICD-10-CM | POA: Diagnosis not present

## 2023-08-18 DIAGNOSIS — Z87440 Personal history of urinary (tract) infections: Secondary | ICD-10-CM | POA: Diagnosis not present

## 2023-08-18 DIAGNOSIS — E875 Hyperkalemia: Secondary | ICD-10-CM | POA: Diagnosis not present

## 2023-08-18 DIAGNOSIS — I700 Atherosclerosis of aorta: Secondary | ICD-10-CM | POA: Diagnosis not present

## 2023-08-18 DIAGNOSIS — R799 Abnormal finding of blood chemistry, unspecified: Secondary | ICD-10-CM | POA: Diagnosis not present

## 2023-08-18 DIAGNOSIS — S93401A Sprain of unspecified ligament of right ankle, initial encounter: Secondary | ICD-10-CM | POA: Diagnosis present

## 2023-08-18 DIAGNOSIS — R7401 Elevation of levels of liver transaminase levels: Secondary | ICD-10-CM | POA: Diagnosis not present

## 2023-08-18 DIAGNOSIS — K0889 Other specified disorders of teeth and supporting structures: Secondary | ICD-10-CM | POA: Diagnosis present

## 2023-08-18 DIAGNOSIS — K8020 Calculus of gallbladder without cholecystitis without obstruction: Secondary | ICD-10-CM | POA: Diagnosis not present

## 2023-08-18 DIAGNOSIS — N390 Urinary tract infection, site not specified: Secondary | ICD-10-CM | POA: Diagnosis not present

## 2023-08-18 DIAGNOSIS — E7800 Pure hypercholesterolemia, unspecified: Secondary | ICD-10-CM | POA: Diagnosis not present

## 2023-08-18 DIAGNOSIS — E871 Hypo-osmolality and hyponatremia: Secondary | ICD-10-CM | POA: Diagnosis not present

## 2023-08-18 DIAGNOSIS — R404 Transient alteration of awareness: Secondary | ICD-10-CM | POA: Diagnosis not present

## 2023-08-18 DIAGNOSIS — H547 Unspecified visual loss: Secondary | ICD-10-CM | POA: Diagnosis present

## 2023-08-18 DIAGNOSIS — D649 Anemia, unspecified: Secondary | ICD-10-CM | POA: Diagnosis not present

## 2023-08-18 DIAGNOSIS — I10 Essential (primary) hypertension: Secondary | ICD-10-CM | POA: Diagnosis not present

## 2023-08-18 DIAGNOSIS — Z23 Encounter for immunization: Secondary | ICD-10-CM | POA: Diagnosis not present

## 2023-08-18 DIAGNOSIS — E669 Obesity, unspecified: Secondary | ICD-10-CM | POA: Diagnosis present

## 2023-08-18 DIAGNOSIS — E1122 Type 2 diabetes mellitus with diabetic chronic kidney disease: Secondary | ICD-10-CM | POA: Diagnosis not present

## 2023-08-18 DIAGNOSIS — N184 Chronic kidney disease, stage 4 (severe): Secondary | ICD-10-CM | POA: Diagnosis not present

## 2023-08-18 DIAGNOSIS — Z888 Allergy status to other drugs, medicaments and biological substances status: Secondary | ICD-10-CM | POA: Diagnosis not present

## 2023-08-18 DIAGNOSIS — K828 Other specified diseases of gallbladder: Secondary | ICD-10-CM | POA: Diagnosis not present

## 2023-08-18 DIAGNOSIS — I129 Hypertensive chronic kidney disease with stage 1 through stage 4 chronic kidney disease, or unspecified chronic kidney disease: Secondary | ICD-10-CM | POA: Diagnosis not present

## 2023-08-18 DIAGNOSIS — E872 Acidosis, unspecified: Secondary | ICD-10-CM | POA: Diagnosis not present

## 2023-08-18 DIAGNOSIS — R509 Fever, unspecified: Secondary | ICD-10-CM | POA: Diagnosis not present

## 2023-08-18 DIAGNOSIS — E785 Hyperlipidemia, unspecified: Secondary | ICD-10-CM | POA: Diagnosis not present

## 2023-08-18 DIAGNOSIS — R7989 Other specified abnormal findings of blood chemistry: Secondary | ICD-10-CM | POA: Diagnosis not present

## 2023-08-18 DIAGNOSIS — R339 Retention of urine, unspecified: Secondary | ICD-10-CM | POA: Diagnosis not present

## 2023-08-18 DIAGNOSIS — Z7401 Bed confinement status: Secondary | ICD-10-CM | POA: Diagnosis not present

## 2023-08-18 DIAGNOSIS — K5900 Constipation, unspecified: Secondary | ICD-10-CM | POA: Diagnosis not present

## 2023-08-18 DIAGNOSIS — E782 Mixed hyperlipidemia: Secondary | ICD-10-CM | POA: Diagnosis not present

## 2023-08-18 DIAGNOSIS — X501XXA Overexertion from prolonged static or awkward postures, initial encounter: Secondary | ICD-10-CM | POA: Diagnosis not present

## 2023-08-18 DIAGNOSIS — N3289 Other specified disorders of bladder: Secondary | ICD-10-CM | POA: Diagnosis present

## 2023-08-18 LAB — BASIC METABOLIC PANEL
Anion gap: 8 (ref 5–15)
BUN: 18 mg/dL (ref 8–23)
CO2: 23 mmol/L (ref 22–32)
Calcium: 9.6 mg/dL (ref 8.9–10.3)
Chloride: 88 mmol/L — ABNORMAL LOW (ref 98–111)
Creatinine, Ser: 1.45 mg/dL — ABNORMAL HIGH (ref 0.44–1.00)
GFR, Estimated: 34 mL/min — ABNORMAL LOW (ref >60)
Glucose, Bld: 105 mg/dL — ABNORMAL HIGH (ref 70–99)
Potassium: 5.1 mmol/L (ref 3.5–5.1)
Sodium: 119 mmol/L — CL (ref 135–145)

## 2023-08-18 LAB — SODIUM
Sodium: 121 mmol/L — ABNORMAL LOW (ref 135–145)
Sodium: 123 mmol/L — ABNORMAL LOW (ref 135–145)
Sodium: 124 mmol/L — ABNORMAL LOW (ref 135–145)

## 2023-08-18 LAB — GLUCOSE, CAPILLARY: Glucose-Capillary: 96 mg/dL (ref 70–99)

## 2023-08-18 LAB — BRAIN NATRIURETIC PEPTIDE: B Natriuretic Peptide: 56.6 pg/mL (ref 0.0–100.0)

## 2023-08-18 MED ORDER — TOLVAPTAN 15 MG PO TABS
30.0000 mg | ORAL_TABLET | Freq: Once | ORAL | Status: DC
  Filled 2023-08-18: qty 2

## 2023-08-18 MED ORDER — TOLVAPTAN 15 MG PO TABS: 15.0000 mg | ORAL_TABLET | Freq: Once | ORAL | Status: DC

## 2023-08-18 MED ORDER — SODIUM CHLORIDE 0.9 % IV SOLN: INTRAVENOUS | Status: DC

## 2023-08-18 MED ORDER — SODIUM CHLORIDE 1 G PO TABS
2.0000 g | ORAL_TABLET | Freq: Two times a day (BID) | ORAL | Status: DC
  Administered 2023-08-18: 2 g via ORAL
  Filled 2023-08-18: qty 2

## 2023-08-18 MED ORDER — SODIUM CHLORIDE 0.9 % IV SOLN: INTRAVENOUS | Status: AC

## 2023-08-18 MED ORDER — TRIMETHOPRIM 100 MG PO TABS
50.0000 mg | ORAL_TABLET | Freq: Every day | ORAL | Status: DC
  Administered 2023-08-18 – 2023-08-25 (×8): 50 mg via ORAL
  Filled 2023-08-18 (×8): qty 1

## 2023-08-18 NOTE — Progress Notes (Signed)
PROGRESS NOTE    Joanne Patterson  WUJ:[id number] DOB: 1928-10-05 DOA: 08/16/2023 PCP: Jarrett Soho, PA-C    Brief Narrative:  Patient is a 89 year old with history of diet-controlled type 2 diabetes, hypertension and hyperlipidemia, recurrent UTI and bladder spasms following up at nephrology office and found to have sodium level of 120 on regular checkup.  Patient complained of weakness than usual.  Brought to the ER.  Hemodynamically stable.  Elevated blood pressures.  Sodium 120 on arrival.  Admitted with nephrology consultation.   Assessment & Plan:   Symptomatic hyponatremia, currently without acute neurological symptoms: This is likely multifactorial.  Patient also on olmesartan hydrochlorothiazide.  Investigations negative so far.  Patient is euvolemic and has adequate oral intake. Regular diet, 1200 mL water restriction and addition of salt tablets did not help. 1 dose of tolvaptan 10/10, no improvement of sodium. Today on isotonic fluid trial. Sodium every 6 hours. Urinalysis, bland  TSH, normal  Sodium without significant improvement.  120-123-124-121-120 Followed by nephrology.   Essential hypertension: Blood pressures elevated.  Restart amlodipine.  Discontinue hydrochlorothiazide with olmesartan.   Right ankle sprain: X-ray negative.  Mobilize with PT OT if no fractures.  Can use compression bandages.  She will benefit with home health.   Hyperlipidemia: Continue statin.   CKD stage IV: At about baseline.  Under surveillance by nephrology as outpatient.  Recurrent UTI: Completing course of Keflex.   DVT prophylaxis: enoxaparin (LOVENOX) injection 30 mg Start: 08/16/23 2200   Code Status: Full code Family Communication: Daughter and granddaughter at the bedside Disposition Plan: Status is: Observation The patient will require care spanning > 2 midnights and should be moved to inpatient because: Significant hyponatremia     Consultants:   Nephrology  Procedures:  None  Antimicrobials:  None   Subjective:  Patient seen and examined.  No overnight events.  Patient herself is poor historian and defers everything to her daughters.  Today, she offers no complaints but per her daughter she complains of having muscle spasms and cramps.  Improved with oxycodone 2.5 mg.  Objective: Vitals:   08/17/23 1346 08/17/23 1409 08/17/23 2001 08/18/23 0459  BP: (!) 141/53  (!) 162/63 (!) 140/53  Pulse: 76  78 76  Resp: 20  16 18   Temp: (!) 97.5 F (36.4 C)  97.6 F (36.4 C) 98 F (36.7 C)  TempSrc: Oral  Oral Oral  SpO2: 100%  100% 100%  Weight:  81 kg  86.7 kg  Height:        Intake/Output Summary (Last 24 hours) at 08/18/2023 1014 Last data filed at 08/18/2023 9562 Gross per 24 hour  Intake 260.69 ml  Output 1550 ml  Net -1289.31 ml   Filed Weights   08/17/23 0630 08/17/23 1409 08/18/23 0459  Weight: 87 kg 81 kg 86.7 kg    Examination:  General exam: Appears calm and comfortable  Respiratory system: Clear to auscultation. Respiratory effort normal.  No added sounds. Cardiovascular system: S1 & S2 heard, RRR.  No edema.   Gastrointestinal system: Soft.  Nontender.  Bowel sound present. Central nervous system: Alert and oriented. No focal neurological deficits.  Patient does have poor vision.  She has generalized weakness but no focal deficits.    Data Reviewed: I have personally reviewed following labs and imaging studies  CBC: Recent Labs  Lab 08/16/23 1220  WBC 8.1  NEUTROABS 5.6  HGB 10.1*  HCT 29.3*  MCV 85.7  PLT 213   Basic Metabolic Panel: Recent  Labs  Lab 08/16/23 1220 08/16/23 2205 08/17/23 0229 08/17/23 0827 08/17/23 1213 08/18/23 0013 08/18/23 0448  NA 120*   < > 123* 121* 121* 119* 121*  K 4.5  --   --   --  6.2* 5.1  --   CL 90*  --   --   --  91* 88*  --   CO2 20*  --   --   --  20* 23  --   GLUCOSE 104*  --   --   --  84 105*  --   BUN 16  --   --   --  17 18  --    CREATININE 1.32*  --   --   --  1.33* 1.45*  --   CALCIUM 9.4  --   --   --  9.3 9.6  --   MG 1.9  --   --   --   --   --   --   PHOS 2.9  --   --   --   --   --   --    < > = values in this interval not displayed.   GFR: Estimated Creatinine Clearance: 26.4 mL/min (A) (by C-G formula based on SCr of 1.45 mg/dL (H)). Liver Function Tests: Recent Labs  Lab 08/16/23 1220  AST 40  ALT 39  ALKPHOS 80  BILITOT 1.0  PROT 7.0  ALBUMIN 3.7   No results for input(s): "LIPASE", "AMYLASE" in the last 168 hours. No results for input(s): "AMMONIA" in the last 168 hours. Coagulation Profile: No results for input(s): "INR", "PROTIME" in the last 168 hours. Cardiac Enzymes: No results for input(s): "CKTOTAL", "CKMB", "CKMBINDEX", "TROPONINI" in the last 168 hours. BNP (last 3 results) No results for input(s): "PROBNP" in the last 8760 hours. HbA1C: No results for input(s): "HGBA1C" in the last 72 hours. CBG: Recent Labs  Lab 08/17/23 0740 08/17/23 0807 08/18/23 0829  GLUCAP 66* 75 96   Lipid Profile: No results for input(s): "CHOL", "HDL", "LDLCALC", "TRIG", "CHOLHDL", "LDLDIRECT" in the last 72 hours. Thyroid Function Tests: Recent Labs    08/16/23 1519  TSH 2.602   Anemia Panel: No results for input(s): "VITAMINB12", "FOLATE", "FERRITIN", "TIBC", "IRON", "RETICCTPCT" in the last 72 hours. Sepsis Labs: No results for input(s): "PROCALCITON", "LATICACIDVEN" in the last 168 hours.  No results found for this or any previous visit (from the past 240 hour(s)).       Radiology Studies: DG Ankle 2 Views Right  Result Date: 08/16/2023 CLINICAL DATA:  Right ankle pain.  No known injury. EXAM: RIGHT ANKLE - 2 VIEW COMPARISON:  None Available. FINDINGS: There is diffuse osteopenia of the visualized osseous structures. No acute fracture or dislocation. No aggressive osseous lesion. Ankle mortise appears intact. Mild diffuse degenerative changes of imaged joints. There is asymmetric  moderate soft tissue swelling overlying the lateral malleolus without discrete underlying fracture, concerning for soft tissue/ligamentous injury. No radiopaque foreign bodies. IMPRESSION: 1. Asymmetric moderate soft tissue swelling overlying the lateral malleolus without discrete underlying fracture, concerning for soft tissue/ligamentous injury. Electronically Signed   By: Jules Schick M.D.   On: 08/16/2023 18:30        Scheduled Meds:  amLODipine  10 mg Oral Daily   ascorbic acid  1,000 mg Oral Daily   aspirin EC  81 mg Oral Q M,W,F   enoxaparin (LOVENOX) injection  30 mg Subcutaneous Q24H   multivitamin with minerals  1 tablet Oral Daily  senna  2 tablet Oral Daily   simvastatin  20 mg Oral QPM   Continuous Infusions:  sodium chloride 75 mL/hr at 08/18/23 0946     LOS: 0 days    Time spent: 40 minutes    Dorcas Carrow, MD [facility]

## 2023-08-18 NOTE — Progress Notes (Signed)
[facility] Progress Note  Subjective: Na+ did not change, (119-121) w/ tolvaptan.   Vitals:   08/17/23 1346 08/17/23 1409 08/17/23 2001 08/18/23 0459  BP: (!) 141/53  (!) 162/63 (!) 140/53  Pulse: 76  78 76  Resp: 20  16 18   Temp: (!) 97.5 F (36.4 C)  97.6 F (36.4 C) 98 F (36.7 C)  TempSrc: Oral  Oral Oral  SpO2: 100%  100% 100%  Weight:  81 kg  86.7 kg  Height:        Exam: Elderly AAF, chronically ill appearing No jvd or bruits Chest clear bilat to bases RRR no RG Abd soft obese ntnd no mass or ascites +bs Ext no LE or UE edema, puffy calves/ ankles but not edema Neuro is alert, gen'd weakness, pleasant        Renal-related home meds: - aleve - novasc 10 - olmesartan- hydrochlorothiazide - others: statin, keflex, asa, vit/ prns      Date                          Creat               eGFR   01/16/23                      1.72   07/27/23                      1.74   08/15/23                    1.43   08/16/23                    1.32                 38   08/17/23                    1.33                 37 ml/min      UA negative      UOsm - 242      UNa - 42       Assessment/ Plan: Hyponatremia - in pt w/ HTN on hydrochlorothiazide and ARB. Pt looks euvolemic, no edema and no hx CHF/ cirrhosis.  Has stage 3 renal failure also.  TSH wnl, cortisol pending. UNa is not low, but pt has CKD which could effect the urine lytes. Hyponatremia thought could be due to hydrochlorothiazide +/- ARB effects. Gave 15mg  tolvaptan but she did not respond, which argues against SIADH. will try 0.9% saline next at 75 cc/hr for possible volume depletion. Also ordering BNP and CXR. F/u Na+ every 6 hrs.  CKD 3b - b/l creat 1.3, eGFR 37 ml/min from this admission. Labs from [facility] earlier this year showed creat 1.4- 1.7 range.  Recurrent UTI's - per pmd            Vinson Moselle MD  [facility] 08/18/2023, 8:02 AM  Recent Labs  Lab 08/16/23 1220 08/17/23 1213 08/18/23 0013   HGB 10.1*  --   --   ALBUMIN 3.7  --   --   CALCIUM 9.4 9.3 9.6  PHOS 2.9  --   --   CREATININE 1.32* 1.33* 1.45*  K 4.5 6.2* 5.1   No results for input(s): "IRON", "TIBC", "FERRITIN" in the last  168 hours. Inpatient medications:  amLODipine  10 mg Oral Daily   ascorbic acid  1,000 mg Oral Daily   aspirin EC  81 mg Oral Q M,W,F   enoxaparin (LOVENOX) injection  30 mg Subcutaneous Q24H   multivitamin with minerals  1 tablet Oral Daily   senna  2 tablet Oral Daily   simvastatin  20 mg Oral QPM   sodium chloride  2 g Oral BID WC    sodium chloride 50 mL/hr at 08/18/23 0202   acetaminophen **OR** acetaminophen, hydrALAZINE, mouth rinse, oxyCODONE, polyvinyl alcohol

## 2023-08-18 NOTE — TOC Progression Note (Signed)
Transition of Care [facility]) - Progression Note    Patient Details  Name: Joanne Patterson MRN: [id number] Date of Birth: Dec 09, 1927  [facility] CM/SW Contact  Dondrell Loudermilk, Olegario Messier, RN Phone Number: 08/18/2023, 4:11 PM  Clinical Narrative:  spoke to dtr [name] about d/c plans-PT recc T SNF-dtr declines [facility] prefer HHC-will check on agency for HHC-HHPT/OT.    Expected Discharge Plan: Home w Home Health Services Barriers to Discharge: Continued Medical Work up  Expected Discharge Plan and Services   Discharge Planning Services: CM Consult Post Acute Care Choice: Resumption of Svcs/PTA Provider Living arrangements for the past 2 months: Single Family Home                                       Social Determinants of Health (SDOH) Interventions SDOH Screenings   Food Insecurity: No Food Insecurity (08/17/2023)  Housing: Low Risk  (08/17/2023)  Transportation Needs: No Transportation Needs (08/17/2023)  Utilities: Not At Risk (08/17/2023)  Tobacco Use: Low Risk  (08/17/2023)    Readmission Risk Interventions     No data to display

## 2023-08-18 NOTE — Plan of Care (Signed)
  Problem: Education: Goal: Knowledge of General Education information will improve Description: Including pain rating scale, medication(s)/side effects and non-pharmacologic comfort measures Outcome: Progressing   Problem: Clinical Measurements: Goal: Will remain free from infection Outcome: Progressing Goal: Respiratory complications will improve Outcome: Progressing   Problem: Nutrition: Goal: Adequate nutrition will be maintained Outcome: Progressing   Problem: Coping: Goal: Level of anxiety will decrease Outcome: Progressing   Problem: Pain Managment: Goal: General experience of comfort will improve Outcome: Progressing

## 2023-08-18 NOTE — Plan of Care (Signed)
  Problem: Clinical Measurements: Goal: Diagnostic test results will improve Outcome: Progressing   Problem: Activity: Goal: Risk for activity intolerance will decrease Outcome: Progressing   Problem: Coping: Goal: Level of anxiety will decrease Outcome: Progressing   Problem: Pain Managment: Goal: General experience of comfort will improve Outcome: Progressing   Problem: Safety: Goal: Ability to remain free from injury will improve Outcome: Progressing   

## 2023-08-19 DIAGNOSIS — E871 Hypo-osmolality and hyponatremia: Secondary | ICD-10-CM | POA: Diagnosis not present

## 2023-08-19 LAB — GLUCOSE, CAPILLARY: Glucose-Capillary: 78 mg/dL (ref 70–99)

## 2023-08-19 LAB — BASIC METABOLIC PANEL
Anion gap: 9 (ref 5–15)
BUN: 18 mg/dL (ref 8–23)
CO2: 21 mmol/L — ABNORMAL LOW (ref 22–32)
Calcium: 9.1 mg/dL (ref 8.9–10.3)
Chloride: 99 mmol/L (ref 98–111)
Creatinine, Ser: 1.25 mg/dL — ABNORMAL HIGH (ref 0.44–1.00)
GFR, Estimated: 40 mL/min — ABNORMAL LOW (ref >60)
Glucose, Bld: 88 mg/dL (ref 70–99)
Potassium: 4.5 mmol/L (ref 3.5–5.1)
Sodium: 129 mmol/L — ABNORMAL LOW (ref 135–145)

## 2023-08-19 LAB — SODIUM: Sodium: 126 mmol/L — ABNORMAL LOW (ref 135–145)

## 2023-08-19 LAB — CORTISOL-AM, BLOOD: Cortisol - AM: 13.4 ug/dL (ref 6.7–22.6)

## 2023-08-19 MED ORDER — SODIUM CHLORIDE 0.9 % IV SOLN: INTRAVENOUS | Status: AC

## 2023-08-19 NOTE — Progress Notes (Signed)
PROGRESS NOTE    Joanne Patterson  WFU:[id number] DOB: [birth year] DOA: 08/16/2023 PCP: Jarrett Soho, PA-C    Brief Narrative:  Patient is a 89 year old African-American female with past medical history significant for diet-controlled type 2 diabetes, hypertension and hyperlipidemia, recurrent UTI and bladder spasms.  Patient was seen at the nephrology office and was found to have sodium of 120.  Patient was admitted for further assessment and management.  Nephrology input is appreciated.  Sodium is 129 today.  08/19/2023: Patient seen alongside patient's daughter.  Patient lives with the daughter.  Sodium is slowly improving.  Repeat levels in the morning.   Assessment & Plan: Symptomatic hyponatremia: -No acute neurological symptoms. -Sodium is improved to 129. -Repeat urinalysis, urine sodium, urine osmole and serum osmole. -Patient is currently on low-dose IV fluids. -Salt tablets have been discontinued. -Nephrology is directing care.  Input is appreciated.   Essential hypertension: -Blood pressure is currently controlled. -Last documented blood pressure is 138/57 mmHg.     Right ankle sprain:  X-ray negative.  Mobilize with PT OT if no fractures.  Can use compression bandages.  She will benefit with home health.   Hyperlipidemia: Continue statin.   CKD stage 3B: -Stable.  Recurrent UTI:  DVT prophylaxis: enoxaparin (LOVENOX) injection 30 mg Start: 08/16/23 2200  Code Status: Full code Family Communication: Daughter at the bedside Disposition Plan: Status is: Patient. The patient will require care spanning > 2 midnights   Consultants:  Nephrology  Procedures:  None  Antimicrobials:  None   Subjective: No new complaints. No significant history from patient.  Objective: Vitals:   08/18/23 2037 08/19/23 0435 08/19/23 0443 08/19/23 1319  BP: (!) 151/57 (!) 136/49  (!) 138/57  Pulse: 80 76  81  Resp: 18 18  14   Temp: 98 F (36.7 C) 98.6 F (37 C)   98.2 F (36.8 C)  TempSrc: Oral Oral  Oral  SpO2: 98% 100%  100%  Weight:   85.8 kg   Height:        Intake/Output Summary (Last 24 hours) at 08/19/2023 1612 Last data filed at 08/19/2023 1156 Gross per 24 hour  Intake 1290.65 ml  Output 1300 ml  Net -9.35 ml   Filed Weights   08/17/23 1409 08/18/23 0459 08/19/23 0443  Weight: 81 kg 86.7 kg 85.8 kg    Examination: General exam: Patient is obese.  Not in any distress.  Appears calm and comfortable  Respiratory system: Clear to auscultation.  Cardiovascular system: S1 & S2 heard.   Gastrointestinal system: Soft.  Nontender.   Central nervous system: Awake and alert. Extremities: Edema of lower extremities.    Data Reviewed: I have personally reviewed following labs and imaging studies  CBC: Recent Labs  Lab 08/16/23 1220  WBC 8.1  NEUTROABS 5.6  HGB 10.1*  HCT 29.3*  MCV 85.7  PLT 213   Basic Metabolic Panel: Recent Labs  Lab 08/16/23 1220 08/16/23 2205 08/17/23 1213 08/18/23 0013 08/18/23 0448 08/18/23 1240 08/18/23 1942 08/19/23 0130 08/19/23 0733  NA 120*   < > 121* 119* 121* 123* 124* 126* 129*  K 4.5  --  6.2* 5.1  --   --   --   --  4.5  CL 90*  --  91* 88*  --   --   --   --  99  CO2 20*  --  20* 23  --   --   --   --  21*  GLUCOSE 104*  --  84 105*  --   --   --   --  88  BUN 16  --  17 18  --   --   --   --  18  CREATININE 1.32*  --  1.33* 1.45*  --   --   --   --  1.25*  CALCIUM 9.4  --  9.3 9.6  --   --   --   --  9.1  MG 1.9  --   --   --   --   --   --   --   --   PHOS 2.9  --   --   --   --   --   --   --   --    < > = values in this interval not displayed.   GFR: Estimated Creatinine Clearance: 30.4 mL/min (A) (by C-G formula based on SCr of 1.25 mg/dL (H)). Liver Function Tests: Recent Labs  Lab 08/16/23 1220  AST 40  ALT 39  ALKPHOS 80  BILITOT 1.0  PROT 7.0  ALBUMIN 3.7   No results for input(s): "LIPASE", "AMYLASE" in the last 168 hours. No results for input(s): "AMMONIA"  in the last 168 hours. Coagulation Profile: No results for input(s): "INR", "PROTIME" in the last 168 hours. Cardiac Enzymes: No results for input(s): "CKTOTAL", "CKMB", "CKMBINDEX", "TROPONINI" in the last 168 hours. BNP (last 3 results) No results for input(s): "PROBNP" in the last 8760 hours. HbA1C: No results for input(s): "HGBA1C" in the last 72 hours. CBG: Recent Labs  Lab 08/17/23 0740 08/17/23 0807 08/18/23 0829 08/19/23 0716  GLUCAP 66* 75 96 78   Lipid Profile: No results for input(s): "CHOL", "HDL", "LDLCALC", "TRIG", "CHOLHDL", "LDLDIRECT" in the last 72 hours. Thyroid Function Tests: No results for input(s): "TSH", "T4TOTAL", "FREET4", "T3FREE", "THYROIDAB" in the last 72 hours.  Anemia Panel: No results for input(s): "VITAMINB12", "FOLATE", "FERRITIN", "TIBC", "IRON", "RETICCTPCT" in the last 72 hours. Sepsis Labs: No results for input(s): "PROCALCITON", "LATICACIDVEN" in the last 168 hours.  No results found for this or any previous visit (from the past 240 hour(s)).       Radiology Studies: DG CHEST PORT 1 VIEW  Result Date: 08/18/2023 CLINICAL DATA:  Hyponatremia. EXAM: PORTABLE CHEST 1 VIEW COMPARISON:  None Available. FINDINGS: The heart size and mediastinal contours are within normal limits. Both lungs are clear. The visualized skeletal structures are unremarkable. IMPRESSION: No active disease. Aortic Atherosclerosis (ICD10-I70.0). Electronically Signed   By: Lupita Raider M.D.   On: 08/18/2023 12:12        Scheduled Meds:  amLODipine  10 mg Oral Daily   ascorbic acid  1,000 mg Oral Daily   aspirin EC  81 mg Oral Q M,W,F   enoxaparin (LOVENOX) injection  30 mg Subcutaneous Q24H   multivitamin with minerals  1 tablet Oral Daily   senna  2 tablet Oral Daily   simvastatin  20 mg Oral QPM   trimethoprim  50 mg Oral Daily   Continuous Infusions:  sodium chloride 50 mL/hr at 08/19/23 1205     LOS: 1 day    Time spent: 40  minutes    Barnetta Chapel, MD [facility]

## 2023-08-19 NOTE — Progress Notes (Addendum)
[facility] Progress Note  Subjective: Na+ improved up to 129 today w/ 0.9% saline. Family says the patient "is coming back to her real self" this am.   Vitals:   08/18/23 1159 08/18/23 2037 08/19/23 0435 08/19/23 0443  BP: (!) 144/51 (!) 151/57 (!) 136/49   Pulse: 79 80 76   Resp: 16 18 18    Temp: 97.6 F (36.4 C) 98 F (36.7 C) 98.6 F (37 C)   TempSrc: Oral Oral Oral   SpO2: 100% 98% 100%   Weight:    85.8 kg  Height:        Exam: Elderly AAF, chronically ill appearing No jvd or bruits Chest clear bilat to bases RRR no RG Abd soft obese ntnd no mass or ascites +bs Ext no LE or UE edema, puffy calves/ ankles but not edema Neuro is alert, gen'd weakness, pleasant        Renal-related home meds: - aleve - norvasc 10 - olmesartan- hydrochlorothiazide - others: statin, keflex, asa, vit/ prns      Date                          Creat               eGFR   01/16/23                      1.72   07/27/23                      1.74   08/15/23                    1.43   08/16/23                    1.32                 38   08/17/23                    1.33                 37 ml/min      UA negative      UOsm - 242      UNa - 42   BNP 56    CXR - clear     Assessment/ Plan: Hyponatremia - in pt w/ HTN on hydrochlorothiazide and ARB. Pt looks euvolemic, no edema and no hx CHF/ cirrhosis.  Has stage 3 renal failure also.  TSH wnl, cortisol pending. UNa is not low, but pt has CKD which could effect the urine lytes. Hyponatremia thought could be due to hydrochlorothiazide +/- ARB effects. Gave 15mg  tolvaptan but she did not respond, which argues against ^ADH issues. With 0.9% saline at 75 cc/hr x 24 hrs, her Na+ has improved up to 129. Which proves that the dx was volume depletion. Will need more 0.9% saline for 1-2 days. Family notes she is "acting more herself" today, c/w recovery from dehydration and/or resolution of hyponatremia symptoms. Will order another 24 hrs of  0.9% at 50 cc/hr. Avoid diuretics as a treatment for HTN. No further suggestions, will sign off.  CKD 3b - b/l creat 1.3, eGFR 37 ml/min from this admission. Labs from [facility] earlier this year showed creat 1.4- 1.7 range.  Recurrent UTI's - per pmd  HTN - was on norvasc, hydrochlorothiazide and ARB. Avoid all types  of diuretics as a treatment for HTN as she has high risk for dehydration.            Vinson Moselle MD  [facility] 08/19/2023, 11:44 AM  Recent Labs  Lab 08/16/23 1220 08/17/23 1213 08/18/23 0013 08/19/23 0733  HGB 10.1*  --   --   --   ALBUMIN 3.7  --   --   --   CALCIUM 9.4   < > 9.6 9.1  PHOS 2.9  --   --   --   CREATININE 1.32*   < > 1.45* 1.25*  K 4.5   < > 5.1 4.5   < > = values in this interval not displayed.   No results for input(s): "IRON", "TIBC", "FERRITIN" in the last 168 hours. Inpatient medications:  amLODipine  10 mg Oral Daily   ascorbic acid  1,000 mg Oral Daily   aspirin EC  81 mg Oral Q M,W,F   enoxaparin (LOVENOX) injection  30 mg Subcutaneous Q24H   multivitamin with minerals  1 tablet Oral Daily   senna  2 tablet Oral Daily   simvastatin  20 mg Oral QPM   trimethoprim  50 mg Oral Daily     acetaminophen **OR** acetaminophen, hydrALAZINE, mouth rinse, oxyCODONE, polyvinyl alcohol

## 2023-08-19 NOTE — Plan of Care (Signed)
  Problem: Education: Goal: Knowledge of General Education information will improve Description: Including pain rating scale, medication(s)/side effects and non-pharmacologic comfort measures Outcome: Progressing   Problem: Clinical Measurements: Goal: Will remain free from infection Outcome: Progressing Goal: Cardiovascular complication will be avoided Outcome: Progressing   Problem: Nutrition: Goal: Adequate nutrition will be maintained Outcome: Progressing   Problem: Safety: Goal: Ability to remain free from injury will improve Outcome: Progressing   Problem: Skin Integrity: Goal: Risk for impaired skin integrity will decrease Outcome: Progressing

## 2023-08-20 DIAGNOSIS — E871 Hypo-osmolality and hyponatremia: Secondary | ICD-10-CM | POA: Diagnosis not present

## 2023-08-20 LAB — URINALYSIS, ROUTINE W REFLEX MICROSCOPIC
Bilirubin Urine: NEGATIVE
Glucose, UA: NEGATIVE mg/dL
Ketones, ur: NEGATIVE mg/dL
Nitrite: NEGATIVE
Protein, ur: NEGATIVE mg/dL
Specific Gravity, Urine: 1.008 (ref 1.005–1.030)
pH: 5 (ref 5.0–8.0)

## 2023-08-20 LAB — RENAL FUNCTION PANEL
Albumin: 2.7 g/dL — ABNORMAL LOW (ref 3.5–5.0)
Anion gap: 8 (ref 5–15)
BUN: 22 mg/dL (ref 8–23)
CO2: 20 mmol/L — ABNORMAL LOW (ref 22–32)
Calcium: 8.7 mg/dL — ABNORMAL LOW (ref 8.9–10.3)
Chloride: 98 mmol/L (ref 98–111)
Creatinine, Ser: 1.38 mg/dL — ABNORMAL HIGH (ref 0.44–1.00)
GFR, Estimated: 36 mL/min — ABNORMAL LOW (ref >60)
Glucose, Bld: 86 mg/dL (ref 70–99)
Phosphorus: 3.1 mg/dL (ref 2.5–4.6)
Potassium: 4.7 mmol/L (ref 3.5–5.1)
Sodium: 126 mmol/L — ABNORMAL LOW (ref 135–145)

## 2023-08-20 LAB — CBC WITH DIFFERENTIAL/PLATELET
Abs Immature Granulocytes: 0.12 10*3/uL — ABNORMAL HIGH (ref 0.00–0.07)
Basophils Absolute: 0 10*3/uL (ref 0.0–0.1)
Basophils Relative: 0 %
Eosinophils Absolute: 0.1 10*3/uL (ref 0.0–0.5)
Eosinophils Relative: 1 %
HCT: 23.6 % — ABNORMAL LOW (ref 36.0–46.0)
Hemoglobin: 7.6 g/dL — ABNORMAL LOW (ref 12.0–15.0)
Immature Granulocytes: 1 %
Lymphocytes Relative: 21 %
Lymphs Abs: 2.1 10*3/uL (ref 0.7–4.0)
MCH: 28.6 pg (ref 26.0–34.0)
MCHC: 32.2 g/dL (ref 30.0–36.0)
MCV: 88.7 fL (ref 80.0–100.0)
Monocytes Absolute: 1.1 10*3/uL — ABNORMAL HIGH (ref 0.1–1.0)
Monocytes Relative: 11 %
Neutro Abs: 6.6 10*3/uL (ref 1.7–7.7)
Neutrophils Relative %: 66 %
Platelets: 189 10*3/uL (ref 150–400)
RBC: 2.66 MIL/uL — ABNORMAL LOW (ref 3.87–5.11)
RDW: 14.6 % (ref 11.5–15.5)
WBC: 10 10*3/uL (ref 4.0–10.5)
nRBC: 0 % (ref 0.0–0.2)

## 2023-08-20 LAB — BASIC METABOLIC PANEL
Anion gap: 7 (ref 5–15)
BUN: 22 mg/dL (ref 8–23)
CO2: 22 mmol/L (ref 22–32)
Calcium: 8.8 mg/dL — ABNORMAL LOW (ref 8.9–10.3)
Chloride: 98 mmol/L (ref 98–111)
Creatinine, Ser: 1.36 mg/dL — ABNORMAL HIGH (ref 0.44–1.00)
GFR, Estimated: 36 mL/min — ABNORMAL LOW (ref >60)
Glucose, Bld: 86 mg/dL (ref 70–99)
Potassium: 4.8 mmol/L (ref 3.5–5.1)
Sodium: 127 mmol/L — ABNORMAL LOW (ref 135–145)

## 2023-08-20 LAB — SODIUM, URINE, RANDOM: Sodium, Ur: 62 mmol/L

## 2023-08-20 LAB — OSMOLALITY, URINE: Osmolality, Ur: 319 mosm/kg (ref 300–900)

## 2023-08-20 LAB — OSMOLALITY: Osmolality: 278 mosm/kg (ref 275–295)

## 2023-08-20 LAB — MAGNESIUM: Magnesium: 2.1 mg/dL (ref 1.7–2.4)

## 2023-08-20 LAB — GLUCOSE, CAPILLARY: Glucose-Capillary: 80 mg/dL (ref 70–99)

## 2023-08-20 NOTE — Progress Notes (Signed)
Mobility Specialist - Progress Note   08/20/23 1543  Mobility  Activity Ambulated with assistance in hallway  Level of Assistance Contact guard assist, steadying assist  Assistive Device Front wheel walker  Distance Ambulated (ft) 20 ft  Activity Response Tolerated well  Mobility Referral Yes  $Mobility charge 1 Mobility  Mobility Specialist Start Time (ACUTE ONLY) [id number]  Mobility Specialist Stop Time (ACUTE ONLY) 0335  Mobility Specialist Time Calculation (min) (ACUTE ONLY) 24 min   Pt received in bed and agreeable to mobility after some encouragement. Pt was minA from supine>sitting & modA from STS. No complaints during session. Pt wheeled back in recliner due to fatigue. Pt to recliner for meal after session with all needs met. Chair alarm on.   [facility]

## 2023-08-20 NOTE — Progress Notes (Signed)
PROGRESS NOTE    Joanne Patterson  ZOX:[id number] DOB: [birth year] DOA: 08/16/2023 PCP: Jarrett Soho, PA-C    Brief Narrative:  Patient is a 89 year old African-American female with past medical history significant for diet-controlled type 2 diabetes, hypertension and hyperlipidemia, recurrent UTI and bladder spasms.  Patient was seen at the nephrology office and was found to have sodium of 120.  Patient was admitted for further assessment and management.  Nephrology input is appreciated.  Sodium is 129 today.  08/20/2023: Patient seen alongside patient's daughter, granddaughter and great grandson.  Sodium is down to 126.  Repeat urine sodium was 62 and serum osmolality 278.  Urine osmolality still pending.  Nephrology input is appreciated.  Nephrology is managing.   Assessment & Plan: Symptomatic hyponatremia: -No acute neurological symptoms. -Sodium is 126 today. -Repeat urine sodium is 62, and serum osmolality 278. -Urine osmolality is pending. -Suspect hyponatremia is likely multifactorial.   -Volume depletion may have been corrected. -Further management will depend on above results. -Nephrology is directing care.  Input is appreciated.   Essential hypertension: -Blood pressure is significantly controlled.    Right ankle sprain:  X-ray negative.  Mobilize with PT OT if no fractures.  Can use compression bandages.  She will benefit with home health.   Hyperlipidemia: Continue statin.   CKD stage 3B: -Stable.  Recurrent UTI:  DVT prophylaxis: enoxaparin (LOVENOX) injection 30 mg Start: 08/16/23 2200  Code Status: Full code Family Communication: Daughter at the bedside Disposition Plan: Status is: Patient. The patient will require care spanning > 2 midnights   Consultants:  Nephrology  Procedures:  None  Antimicrobials:  None   Subjective: No new complaints. No significant history from patient.  Objective: Vitals:   08/19/23 2030 08/20/23 0500 08/20/23  0600 08/20/23 1302  BP: (!) 138/44  (!) 139/55 (!) 147/47  Pulse: 86  81 82  Resp: 16  20 19   Temp: 98.4 F (36.9 C)  98.4 F (36.9 C) 98.7 F (37.1 C)  TempSrc: Oral  Oral Oral  SpO2: 100%  100% 100%  Weight:  89.2 kg    Height:        Intake/Output Summary (Last 24 hours) at 08/20/2023 1654 Last data filed at 08/20/2023 1510 Gross per 24 hour  Intake 1560.39 ml  Output 520 ml  Net 1040.39 ml   Filed Weights   08/18/23 0459 08/19/23 0443 08/20/23 0500  Weight: 86.7 kg 85.8 kg 89.2 kg    Examination: General exam: Patient is obese.  Not in any distress.  Appears calm and comfortable  Respiratory system: Clear to auscultation.  Cardiovascular system: S1 & S2 heard.   Gastrointestinal system: Soft.  Nontender.   Central nervous system: Awake and alert. Extremities: Edema of lower extremities.    Data Reviewed: I have personally reviewed following labs and imaging studies  CBC: Recent Labs  Lab 08/16/23 1220 08/20/23 0507  WBC 8.1 10.0  NEUTROABS 5.6 6.6  HGB 10.1* 7.6*  HCT 29.3* 23.6*  MCV 85.7 88.7  PLT 213 189   Basic Metabolic Panel: Recent Labs  Lab 08/16/23 1220 08/16/23 2205 08/17/23 1213 08/18/23 0013 08/18/23 0448 08/18/23 1240 08/18/23 1942 08/19/23 0130 08/19/23 0733 08/20/23 0507  NA 120*   < > 121* 119*   < > 123* 124* 126* 129* 127*  126*  K 4.5  --  6.2* 5.1  --   --   --   --  4.5 4.8  4.7  CL 90*  --  91*  88*  --   --   --   --  99 98  98  CO2 20*  --  20* 23  --   --   --   --  21* 22  20*  GLUCOSE 104*  --  84 105*  --   --   --   --  88 86  86  BUN 16  --  17 18  --   --   --   --  18 22  22   CREATININE 1.32*  --  1.33* 1.45*  --   --   --   --  1.25* 1.36*  1.38*  CALCIUM 9.4  --  9.3 9.6  --   --   --   --  9.1 8.8*  8.7*  MG 1.9  --   --   --   --   --   --   --   --  2.1  PHOS 2.9  --   --   --   --   --   --   --   --  3.1   < > = values in this interval not displayed.   GFR: Estimated Creatinine Clearance: 28.5  mL/min (A) (by C-G formula based on SCr of 1.36 mg/dL (H)). Liver Function Tests: Recent Labs  Lab 08/16/23 1220 08/20/23 0507  AST 40  --   ALT 39  --   ALKPHOS 80  --   BILITOT 1.0  --   PROT 7.0  --   ALBUMIN 3.7 2.7*   No results for input(s): "LIPASE", "AMYLASE" in the last 168 hours. No results for input(s): "AMMONIA" in the last 168 hours. Coagulation Profile: No results for input(s): "INR", "PROTIME" in the last 168 hours. Cardiac Enzymes: No results for input(s): "CKTOTAL", "CKMB", "CKMBINDEX", "TROPONINI" in the last 168 hours. BNP (last 3 results) No results for input(s): "PROBNP" in the last 8760 hours. HbA1C: No results for input(s): "HGBA1C" in the last 72 hours. CBG: Recent Labs  Lab 08/17/23 0740 08/17/23 0807 08/18/23 0829 08/19/23 0716 08/20/23 0751  GLUCAP 66* 75 96 78 80   Lipid Profile: No results for input(s): "CHOL", "HDL", "LDLCALC", "TRIG", "CHOLHDL", "LDLDIRECT" in the last 72 hours. Thyroid Function Tests: No results for input(s): "TSH", "T4TOTAL", "FREET4", "T3FREE", "THYROIDAB" in the last 72 hours.  Anemia Panel: No results for input(s): "VITAMINB12", "FOLATE", "FERRITIN", "TIBC", "IRON", "RETICCTPCT" in the last 72 hours. Sepsis Labs: No results for input(s): "PROCALCITON", "LATICACIDVEN" in the last 168 hours.  No results found for this or any previous visit (from the past 240 hour(s)).       Radiology Studies: No results found.      Scheduled Meds:  amLODipine  10 mg Oral Daily   ascorbic acid  1,000 mg Oral Daily   aspirin EC  81 mg Oral Q M,W,F   enoxaparin (LOVENOX) injection  30 mg Subcutaneous Q24H   multivitamin with minerals  1 tablet Oral Daily   senna  2 tablet Oral Daily   simvastatin  20 mg Oral QPM   trimethoprim  50 mg Oral Daily   Continuous Infusions:     LOS: 2 days    Time spent: 35 minutes    Barnetta Chapel, MD [facility]

## 2023-08-21 DIAGNOSIS — E782 Mixed hyperlipidemia: Secondary | ICD-10-CM

## 2023-08-21 DIAGNOSIS — E875 Hyperkalemia: Secondary | ICD-10-CM

## 2023-08-21 DIAGNOSIS — E871 Hypo-osmolality and hyponatremia: Secondary | ICD-10-CM | POA: Diagnosis not present

## 2023-08-21 DIAGNOSIS — N184 Chronic kidney disease, stage 4 (severe): Secondary | ICD-10-CM

## 2023-08-21 DIAGNOSIS — I10 Essential (primary) hypertension: Secondary | ICD-10-CM

## 2023-08-21 LAB — RENAL FUNCTION PANEL
Albumin: 2.8 g/dL — ABNORMAL LOW (ref 3.5–5.0)
Anion gap: 9 (ref 5–15)
BUN: 23 mg/dL (ref 8–23)
CO2: 19 mmol/L — ABNORMAL LOW (ref 22–32)
Calcium: 9.4 mg/dL (ref 8.9–10.3)
Chloride: 105 mmol/L (ref 98–111)
Creatinine, Ser: 1.4 mg/dL — ABNORMAL HIGH (ref 0.44–1.00)
GFR, Estimated: 35 mL/min — ABNORMAL LOW (ref >60)
Glucose, Bld: 97 mg/dL (ref 70–99)
Phosphorus: 3.3 mg/dL (ref 2.5–4.6)
Potassium: 6.5 mmol/L (ref 3.5–5.1)
Sodium: 133 mmol/L — ABNORMAL LOW (ref 135–145)

## 2023-08-21 LAB — BASIC METABOLIC PANEL
Anion gap: 8 (ref 5–15)
BUN: 23 mg/dL (ref 8–23)
CO2: 19 mmol/L — ABNORMAL LOW (ref 22–32)
Calcium: 9.2 mg/dL (ref 8.9–10.3)
Chloride: 103 mmol/L (ref 98–111)
Creatinine, Ser: 1.38 mg/dL — ABNORMAL HIGH (ref 0.44–1.00)
GFR, Estimated: 36 mL/min — ABNORMAL LOW (ref >60)
Glucose, Bld: 95 mg/dL (ref 70–99)
Potassium: 6.5 mmol/L (ref 3.5–5.1)
Sodium: 130 mmol/L — ABNORMAL LOW (ref 135–145)

## 2023-08-21 LAB — GLUCOSE, CAPILLARY: Glucose-Capillary: 100 mg/dL — ABNORMAL HIGH (ref 70–99)

## 2023-08-21 LAB — POTASSIUM
Potassium: 4.6 mmol/L (ref 3.5–5.1)
Potassium: 4.8 mmol/L (ref 3.5–5.1)
Potassium: 4.9 mmol/L (ref 3.5–5.1)
Potassium: 4.9 mmol/L (ref 3.5–5.1)

## 2023-08-21 MED ORDER — SODIUM ZIRCONIUM CYCLOSILICATE 10 G PO PACK
10.0000 g | PACK | Freq: Two times a day (BID) | ORAL | Status: AC
  Administered 2023-08-21 (×2): 10 g via ORAL
  Filled 2023-08-21 (×2): qty 1

## 2023-08-21 MED ORDER — DEXTROSE 50 % IV SOLN
1.0000 | Freq: Once | INTRAVENOUS | Status: AC
  Administered 2023-08-21: 50 mL via INTRAVENOUS
  Filled 2023-08-21: qty 50

## 2023-08-21 MED ORDER — INSULIN ASPART 100 UNIT/ML IV SOLN
5.0000 [IU] | Freq: Once | INTRAVENOUS | Status: AC
  Administered 2023-08-21: 5 [IU] via INTRAVENOUS

## 2023-08-21 MED ORDER — ALBUTEROL SULFATE (2.5 MG/3ML) 0.083% IN NEBU
10.0000 mg | INHALATION_SOLUTION | Freq: Once | RESPIRATORY_TRACT | Status: AC
  Administered 2023-08-21: 10 mg via RESPIRATORY_TRACT
  Filled 2023-08-21: qty 12

## 2023-08-21 NOTE — TOC Progression Note (Addendum)
Transition of Care [facility]) - Progression Note    Patient Details  Name: Joanne Patterson MRN: [id number] Date of Birth: June 11, 1928  [facility] CM/SW Contact  Darleene Cleaver, [place] Phone Number: 08/21/2023, 10:29 AM  Clinical Narrative:     Patient's daughter [name] has declined SNF placement.  CSW spoke to Los Alvarez from [facility], she said they have agreed to [facility] PT and OT.  Per Tresa Endo, they will need orders for home health prior to accepting patient.  TOC to continue to follow patient's progress throughout discharge planning.  Expected Discharge Plan: Home w Home Health Services Barriers to Discharge: Continued Medical Work up  Expected Discharge Plan and Services   Discharge Planning Services: CM Consult Post Acute Care Choice: Resumption of Svcs/PTA Provider Living arrangements for the past 2 months: Single Family Home                                       Social Determinants of Health (SDOH) Interventions SDOH Screenings   Food Insecurity: No Food Insecurity (08/17/2023)  Housing: Low Risk  (08/17/2023)  Transportation Needs: No Transportation Needs (08/17/2023)  Utilities: Not At Risk (08/17/2023)  Tobacco Use: Low Risk  (08/17/2023)    Readmission Risk Interventions     No data to display

## 2023-08-21 NOTE — Progress Notes (Addendum)
Physical Therapy Treatment Patient Details Name: Joanne Patterson MRN: [id number] DOB: 12-27-1927 Today's Date: 08/21/2023   History of Present Illness 89 y.o. female with medical history significant of type 2 diabetes currently off medications, essential hypertension on amlodipine and olmesartan hydrochlorothiazide, hyperlipidemia who lives at home with her daughter has been following up for recurrent UTI and bladder spasms, she was found to have sodium level of 120 on follow-up blood test at nephrology office and sent to the emergency room. Rolled ankle at nephrology office and sustained a R ankle sprain.    PT Comments  Pt tolerated increased ambulation distance of 75' with RW, with min assist to manage RW and ongoing verbal/manual cues for sequencing and positioning in RW. Distance limited by fatigue and B knee and R ankle pain. Family plans to care for pt at home. Pt would benefit from a WC. Encouraged family to consider getting a temporary WC ramp for entrance into home.    If plan is discharge home, recommend the following: A lot of help with walking and/or transfers;A lot of help with bathing/dressing/bathroom;Direct supervision/assist for medications management;Assist for transportation;Assistance with cooking/housework;Help with stairs or ramp for entrance   Can travel by private vehicle     No  Equipment Recommendations  Wheelchair (measurements PT);Wheelchair cushion (measurements PT)    Recommendations for Other Services       Precautions / Restrictions Precautions Precautions: Fall Precaution Comments: R ankle sprain; per family knees "give out" Restrictions Weight Bearing Restrictions: No     Mobility  Bed Mobility Overal bed mobility: Needs Assistance Bed Mobility: Supine to Sit     Supine to sit: Mod assist     General bed mobility comments: assist to pivot hips and raise trunk    Transfers Overall transfer level: Needs assistance Equipment used: Rolling  walker (2 wheels) Transfers: Sit to/from Stand Sit to Stand: Mod assist, From elevated surface           General transfer comment: VC for hand placement with RW management prior to sit to stand technique.    Ambulation/Gait Ambulation/Gait assistance: Min assist Gait Distance (Feet): 26 Feet Assistive device: Rolling walker (2 wheels) Gait Pattern/deviations: Step-to pattern, Decreased step length - right, Wide base of support, Trunk flexed Gait velocity: decr     General Gait Details: ongoing multi-modal cues for RW position, to incr step length R   Stairs             Wheelchair Mobility     Tilt Bed    Modified Rankin (Stroke Patients Only)       Balance Overall balance assessment: History of Falls, Needs assistance Sitting-balance support: Feet supported Sitting balance-Leahy Scale: Fair     Standing balance support: During functional activity, Reliant on assistive device for balance Standing balance-Leahy Scale: Poor                              Cognition Arousal: Alert Behavior During Therapy: WFL for tasks assessed/performed Overall Cognitive Status: History of cognitive impairments - at baseline Area of Impairment: Orientation, Attention, Memory, Following commands, Safety/judgement, Awareness, Problem solving                 Orientation Level: Person Current Attention Level: Focused   Following Commands: Follows one step commands with increased time Safety/Judgement: Decreased awareness of deficits, Decreased awareness of safety   Problem Solving: Slow processing, Decreased initiation, Difficulty sequencing, Requires verbal cues, Requires  tactile cues General Comments: family providing cues and pt more responsive to direction from dtr        Exercises      General Comments        Pertinent Vitals/Pain Pain Assessment Faces Pain Scale: Hurts little more Breathing: normal Negative Vocalization: occasional  moan/groan, low speech, negative/disapproving quality Facial Expression: sad, frightened, frown Body Language: tense, distressed pacing, fidgeting Consolability: distracted or reassured by voice/touch PAINAD Score: 4 Pain Location: B ankle and knee joints when mobilized during session, mostly with initial motion Pain Descriptors / Indicators: Grimacing, Guarding Pain Intervention(s): Limited activity within patient's tolerance, Monitored during session, Repositioned    Home Living                          Prior Function            PT Goals (current goals can now be found in the care plan section) Acute Rehab PT Goals PT Goal Formulation: With patient/family Time For Goal Achievement: 08/31/23 Potential to Achieve Goals: Good Progress towards PT goals: Progressing toward goals    Frequency    Min 1X/week      PT Plan      Co-evaluation              AM-PAC PT "6 Clicks" Mobility   Outcome Measure  Help needed turning from your back to your side while in a flat bed without using bedrails?: A Lot Help needed moving from lying on your back to sitting on the side of a flat bed without using bedrails?: A Lot Help needed moving to and from a bed to a chair (including a wheelchair)?: A Lot Help needed standing up from a chair using your arms (e.g., wheelchair or bedside chair)?: A Lot Help needed to walk in hospital room?: A Little Help needed climbing 3-5 steps with a railing? : Total 6 Click Score: 12    End of Session Equipment Utilized During Treatment: Gait belt Activity Tolerance: Patient tolerated treatment well Patient left: with call bell/phone within reach;in chair;with family/visitor present Nurse Communication: Mobility status PT Visit Diagnosis: Other abnormalities of gait and mobility (R26.89);Difficulty in walking, not elsewhere classified (R26.2)     Time: 1335-1406 PT Time Calculation (min) (ACUTE ONLY): 31 min  Charges:    $Gait  Training: 8-22 mins $Therapeutic Activity: 8-22 mins PT General Charges $$ ACUTE PT VISIT: 1 Visit                    Tamala Ser PT 08/21/2023  Acute Rehabilitation Services  Office [phone]

## 2023-08-21 NOTE — Progress Notes (Signed)
[facility]                                                                              Joanne Patterson, is a 89 y.o. female, DOB - [birth year], [id number] Admit date - 08/16/2023    Outpatient Primary MD for the patient is Jarrett Soho, PA-C  LOS - 3  days  Chief Complaint  Patient presents with   hyponatremia   abnormal labs       Brief summary   Patient is a 89 year old African-American female with past medical history significant for diet-controlled type 2 diabetes, hypertension and hyperlipidemia, recurrent UTI and bladder spasms.  Patient was seen at the nephrology office and was found to have sodium of 120.  Patient was admitted for further assessment and management.  Nephrology was consulted.    Assessment & Plan    Principal Problem: Symptomatic hyponatremia -Presented with sodium of 120, trended down to 119 -Nephrology consulted, multifactorial, medication effect from ARB and HCTZ, CKD stage III.  Euvolemic -Cortisol level normal, urine osmolality 319, urine sodium 62, serum osmolarity 278 -Patient received tolvaptan 15 mg but did not respond, subsequently received 0.9% normal saline and improved sodium, hence likely hyponatremia due to volume depletion. -Sodium now improving, continue current management, avoid diuretics HCTZ/ARB.   Active Problems: Hyperkalemia -K 6.5 Unclear if it was hemolysis, repeat lab checked in 2 minutes, not sure if it was a new sample -Treated with Lokelma, insulin D50, albuterol neb -Repeat potassium level pending    Essential hypertension -BP currently stable, currently on Norvasc 10 mg daily    Hyperlipidemia Continue statin    CKD (chronic kidney disease), stage IV (HCC) -Creatinine at baseline, stable  Right ankle sprain -X-ray negative, mobilize with PT OT  Obesity Estimated body mass index is 32.72 kg/m as calculated from the following:   Height as of this encounter: 5\' 5"  (1.651 m).   Weight  as of this encounter: 89.2 kg.  Code Status: Full CODE STATUS DVT Prophylaxis:  enoxaparin (LOVENOX) injection 30 mg Start: 08/16/23 2200   Level of Care: Level of care: Progressive Family Communication: Updated patient's daughter and granddaughter Disposition Plan:      Remains inpatient appropriate: DC home tomorrow with home health PT OT if electrolytes within normal limits   Procedures:    Consultants:   Nephrology  Antimicrobials:   Anti-infectives (From admission, onward)    Start     Dose/Rate Route Frequency Ordered Stop   08/18/23 1830  trimethoprim (TRIMPEX) tablet 50 mg        50 mg Oral Daily 08/18/23 1731     08/16/23 1615  cephALEXin (KEFLEX) capsule 500 mg        500 mg Oral Every 8 hours 08/16/23 1606 08/17/23 1339          Medications  amLODipine  10 mg Oral Daily   ascorbic acid  1,000 mg Oral Daily   aspirin EC  81 mg Oral Q M,W,F   enoxaparin (LOVENOX) injection  30 mg Subcutaneous Q24H   multivitamin with minerals  1 tablet Oral Daily   senna  2 tablet Oral Daily  simvastatin  20 mg Oral QPM   sodium zirconium cyclosilicate  10 g Oral BID   trimethoprim  50 mg Oral Daily      Subjective:   Joanne Patterson was seen and examined today.  Alert and awake, daughter at the bedside, patient is oriented to self and person, no acute issues overnight.  Noted to have hyperkalemia this morning.  Patient denies dizziness, chest pain, shortness of breath, abdominal pain. Objective:   Vitals:   08/20/23 1302 08/20/23 2127 08/21/23 0451 08/21/23 0748  BP: (!) 147/47 (!) 144/43 (!) 139/51   Pulse: 82 86 80   Resp: 19 16 16    Temp: 98.7 F (37.1 C) 98.6 F (37 C) 98.3 F (36.8 C)   TempSrc: Oral Oral Oral   SpO2: 100% 100% 100% 100%  Weight:      Height:        Intake/Output Summary (Last 24 hours) at 08/21/2023 1255 Last data filed at 08/20/2023 2300 Gross per 24 hour  Intake 624.59 ml  Output --  Net 624.59 ml     Wt Readings from Last  3 Encounters:  08/20/23 89.2 kg     Exam General: Alert and oriented x 2, NAD, appears close to her baseline, pleasant, frail Cardiovascular: S1 S2 auscultated,  RRR Respiratory: Clear to auscultation bilaterally, no wheezing Gastrointestinal: Soft, nontender, nondistended, + bowel sounds Ext: no pedal edema bilaterally Neuro: no new FND's Psych: appears close to her baseline    Data Reviewed:  I have personally reviewed following labs    CBC Lab Results  Component Value Date   WBC 10.0 08/20/2023   RBC 2.66 (L) 08/20/2023   HGB 7.6 (L) 08/20/2023   HCT 23.6 (L) 08/20/2023   MCV 88.7 08/20/2023   MCH 28.6 08/20/2023   PLT 189 08/20/2023   MCHC 32.2 08/20/2023   RDW 14.6 08/20/2023   LYMPHSABS 2.1 08/20/2023   MONOABS 1.1 (H) 08/20/2023   EOSABS 0.1 08/20/2023   BASOSABS 0.0 08/20/2023     Last metabolic panel Lab Results  Component Value Date   NA 133 (L) 08/21/2023   K 6.5 (HH) 08/21/2023   CL 105 08/21/2023   CO2 19 (L) 08/21/2023   BUN 23 08/21/2023   CREATININE 1.40 (H) 08/21/2023   GLUCOSE 97 08/21/2023   GFRNONAA 35 (L) 08/21/2023   CALCIUM 9.4 08/21/2023   PHOS 3.3 08/21/2023   PROT 7.0 08/16/2023   ALBUMIN 2.8 (L) 08/21/2023   BILITOT 1.0 08/16/2023   ALKPHOS 80 08/16/2023   AST 40 08/16/2023   ALT 39 08/16/2023   ANIONGAP 9 08/21/2023    CBG (last 3)  Recent Labs    08/19/23 0716 08/20/23 0751 08/21/23 0800  GLUCAP 78 80 100*      Coagulation Profile: No results for input(s): "INR", "PROTIME" in the last 168 hours.   Radiology Studies: I have personally reviewed the imaging studies  No results found.     Thad Ranger M.D. [facility] 08/21/2023, 12:55 PM  Available via Epic secure chat 7am-7pm After 7 pm, please refer to night coverage provider listed on amion.

## 2023-08-22 DIAGNOSIS — I10 Essential (primary) hypertension: Secondary | ICD-10-CM | POA: Diagnosis not present

## 2023-08-22 DIAGNOSIS — E871 Hypo-osmolality and hyponatremia: Secondary | ICD-10-CM | POA: Diagnosis not present

## 2023-08-22 DIAGNOSIS — E7800 Pure hypercholesterolemia, unspecified: Secondary | ICD-10-CM | POA: Diagnosis not present

## 2023-08-22 DIAGNOSIS — N184 Chronic kidney disease, stage 4 (severe): Secondary | ICD-10-CM | POA: Diagnosis not present

## 2023-08-22 LAB — CBC
HCT: 22.1 % — ABNORMAL LOW (ref 36.0–46.0)
Hemoglobin: 7.4 g/dL — ABNORMAL LOW (ref 12.0–15.0)
MCH: 29.5 pg (ref 26.0–34.0)
MCHC: 33.5 g/dL (ref 30.0–36.0)
MCV: 88 fL (ref 80.0–100.0)
Platelets: 212 10*3/uL (ref 150–400)
RBC: 2.51 MIL/uL — ABNORMAL LOW (ref 3.87–5.11)
RDW: 14.5 % (ref 11.5–15.5)
WBC: 8 10*3/uL (ref 4.0–10.5)
nRBC: 0 % (ref 0.0–0.2)

## 2023-08-22 LAB — BASIC METABOLIC PANEL
Anion gap: 7 (ref 5–15)
Anion gap: 9 (ref 5–15)
BUN: 25 mg/dL — ABNORMAL HIGH (ref 8–23)
BUN: 24 mg/dL — ABNORMAL HIGH (ref 8–23)
CO2: 22 mmol/L (ref 22–32)
CO2: 18 mmol/L — ABNORMAL LOW (ref 22–32)
Calcium: 9 mg/dL (ref 8.9–10.3)
Calcium: 8.9 mg/dL (ref 8.9–10.3)
Chloride: 96 mmol/L — ABNORMAL LOW (ref 98–111)
Chloride: 97 mmol/L — ABNORMAL LOW (ref 98–111)
Creatinine, Ser: 1.38 mg/dL — ABNORMAL HIGH (ref 0.44–1.00)
Creatinine, Ser: 1.58 mg/dL — ABNORMAL HIGH (ref 0.44–1.00)
GFR, Estimated: 36 mL/min — ABNORMAL LOW (ref >60)
GFR, Estimated: 30 mL/min — ABNORMAL LOW (ref >60)
Glucose, Bld: 86 mg/dL (ref 70–99)
Glucose, Bld: 135 mg/dL — ABNORMAL HIGH (ref 70–99)
Potassium: 4.8 mmol/L (ref 3.5–5.1)
Potassium: 4.5 mmol/L (ref 3.5–5.1)
Sodium: 125 mmol/L — ABNORMAL LOW (ref 135–145)
Sodium: 124 mmol/L — ABNORMAL LOW (ref 135–145)

## 2023-08-22 LAB — RETICULOCYTES
Immature Retic Fract: 18.2 % — ABNORMAL HIGH (ref 2.3–15.9)
RBC.: 2.77 MIL/uL — ABNORMAL LOW (ref 3.87–5.11)
Retic Count, Absolute: 42.1 10*3/uL (ref 19.0–186.0)
Retic Ct Pct: 1.5 % (ref 0.4–3.1)

## 2023-08-22 LAB — FERRITIN: Ferritin: 136 ng/mL (ref 11–307)

## 2023-08-22 LAB — IRON AND TIBC
Iron: 19 ug/dL — ABNORMAL LOW (ref 28–170)
Saturation Ratios: 8 % — ABNORMAL LOW (ref 10.4–31.8)
TIBC: 252 ug/dL (ref 250–450)
UIBC: 233 ug/dL

## 2023-08-22 LAB — FOLATE: Folate: 30.8 ng/mL (ref >5.9)

## 2023-08-22 LAB — GLUCOSE, CAPILLARY: Glucose-Capillary: 88 mg/dL (ref 70–99)

## 2023-08-22 LAB — OSMOLALITY, URINE: Osmolality, Ur: 479 mosm/kg (ref 300–900)

## 2023-08-22 LAB — VITAMIN B12: Vitamin B-12: 1001 pg/mL — ABNORMAL HIGH (ref 180–914)

## 2023-08-22 MED ORDER — UREA 15 G PO PACK
30.0000 g | PACK | Freq: Two times a day (BID) | ORAL | Status: DC
  Administered 2023-08-22 – 2023-08-24 (×5): 30 g via ORAL
  Filled 2023-08-22 (×6): qty 2

## 2023-08-22 MED ORDER — PNEUMOCOCCAL 20-VAL CONJ VACC 0.5 ML IM SUSY
0.5000 mL | PREFILLED_SYRINGE | INTRAMUSCULAR | Status: DC
  Filled 2023-08-22: qty 0.5

## 2023-08-22 MED ORDER — SODIUM CHLORIDE 0.9 % IV SOLN
200.0000 mg | Freq: Once | INTRAVENOUS | Status: AC
  Administered 2023-08-22: 200 mg via INTRAVENOUS
  Filled 2023-08-22: qty 10

## 2023-08-22 MED ORDER — FUROSEMIDE 20 MG PO TABS: 20.0000 mg | ORAL_TABLET | Freq: Two times a day (BID) | ORAL | Status: DC

## 2023-08-22 MED ORDER — ORAL CARE MOUTH RINSE: 15.0000 mL | OROMUCOSAL | Status: DC | PRN

## 2023-08-22 MED ORDER — INFLUENZA VAC A&B SURF ANT ADJ 0.5 ML IM SUSY
0.5000 mL | PREFILLED_SYRINGE | INTRAMUSCULAR | Status: DC
  Filled 2023-08-22: qty 0.5

## 2023-08-22 MED ORDER — ORAL CARE MOUTH RINSE
15.0000 mL | OROMUCOSAL | Status: DC
  Administered 2023-08-22 – 2023-08-27 (×16): 15 mL via OROMUCOSAL

## 2023-08-22 MED ORDER — PNEUMOCOCCAL 20-VAL CONJ VACC 0.5 ML IM SUSY: 0.5000 mL | PREFILLED_SYRINGE | INTRAMUSCULAR | Status: DC

## 2023-08-22 MED ORDER — TAMSULOSIN HCL 0.4 MG PO CAPS
0.4000 mg | ORAL_CAPSULE | Freq: Every day | ORAL | Status: DC
  Administered 2023-08-22 – 2023-08-27 (×6): 0.4 mg via ORAL
  Filled 2023-08-22 (×6): qty 1

## 2023-08-22 MED ORDER — INFLUENZA VAC A&B SURF ANT ADJ 0.5 ML IM SUSY: 0.5000 mL | PREFILLED_SYRINGE | INTRAMUSCULAR | Status: DC

## 2023-08-22 MED ORDER — SODIUM CHLORIDE 0.9 % IV SOLN: INTRAVENOUS | Status: AC

## 2023-08-22 NOTE — Plan of Care (Signed)
  Problem: Education: Goal: Knowledge of General Education information will improve Description: Including pain rating scale, medication(s)/side effects and non-pharmacologic comfort measures Outcome: Adequate for Discharge   Problem: Health Behavior/Discharge Planning: Goal: Ability to manage health-related needs will improve Outcome: Progressing   Problem: Clinical Measurements: Goal: Ability to maintain clinical measurements within normal limits will improve Outcome: Progressing   Problem: Clinical Measurements: Goal: Will remain free from infection Outcome: Progressing   Problem: Clinical Measurements: Goal: Respiratory complications will improve Outcome: Progressing   Problem: Clinical Measurements: Goal: Cardiovascular complication will be avoided Outcome: Progressing

## 2023-08-22 NOTE — Progress Notes (Signed)
Occupational Therapy Treatment Patient Details Name: Joanne Patterson MRN: [id number] DOB: [birth year] Today's Date: 08/22/2023   History of present illness 89 yr old female with medical history significant of type 2 diabetes currently off medications, essential hypertension, hyperlipidemia who lives at home with her daughter has been following up for recurrent UTI and bladder spasms, she was found to have sodium level of 120 on follow-up blood test at nephrology office and sent to the emergency room. Rolled ankle at nephrology office and sustained a R ankle sprain.   OT comments  The pt was seen for functional strengthening and progression of out of bed activity, as such is needed to prep her for progressive ADL performance. She required increased assist for bed mobility. Once seated EOB, she required mod assist for upper body dressing, then 2 person assist to stand using a RW. She required max reinforcement and reassurance of her abilities, as she appeared fearful of falling and progressive activity. She then required assist for ambulation into the hall using a RW; she was left seated in the bedside chair. Pt's daughter inquired about BUE edema, with OT educating her on elevating BUE to assist in this regard. The pt's daughter desires to take the pt home with home therapy upon discharge, rather than pursue SNF rehab.       If plan is discharge home, recommend the following:  A lot of help with bathing/dressing/bathroom;A lot of help with walking and/or transfers;Help with stairs or ramp for entrance;Assist for transportation;Supervision due to cognitive status;Direct supervision/assist for medications management   Equipment Recommendations  Other (comment)    Recommendations for Other Services      Precautions / Restrictions Precautions Precautions: Fall Restrictions Weight Bearing Restrictions: No       Mobility Bed Mobility Overal bed mobility: Needs Assistance Bed Mobility: Supine  to Sit     Supine to sit: Max assist, HOB elevated, Used rails     General bed mobility comments: required cues for sequencing, including advancing BLE and reaching for bed rail Patient Response: Anxious  Transfers Overall transfer level: Needs assistance Equipment used: Rolling walker (2 wheels) Transfers: Sit to/from Stand Sit to Stand: Mod assist, From elevated surface, +2 physical assistance           General transfer comment: Pt required significant reinforcement and reassurance of her abilities, as she appeared fearful of progressive activity. She was unable to clear her buttocks off the bed on 3 attempts, before achieving standing on the 4th attempt. She required cues for transfer technique, including hand placement, trunk shifting forward, and pushing with BLE         ADL either performed or assessed with clinical judgement   ADL Overall ADL's : Needs assistance/impaired Eating/Feeding: Minimal assistance;Sitting Eating/Feeding Details (indicate cue type and reason): based on clinical judgement Grooming: Minimal assistance;Sitting;Cueing for sequencing Grooming Details (indicate cue type and reason): simulated         Upper Body Dressing : Moderate assistance;Sitting;Cueing for sequencing Upper Body Dressing Details (indicate cue type and reason): required assist for donning a hospital gown seated EOB Lower Body Dressing: Total assistance       Toileting- Clothing Manipulation and Hygiene: Maximal assistance;Sit to/from stand;Total assistance Toileting - Clothing Manipulation Details (indicate cue type and reason): based on clinical judgement                       Cognition Arousal: Alert Behavior During Therapy: WFL for tasks assessed/performed Overall Cognitive Status:  History of cognitive impairments - at baseline Area of Impairment: Following commands, Memory, Attention, Problem solving        Problem Solving: Difficulty sequencing, Decreased  initiation, Requires verbal cues, Requires tactile cues                     Pertinent Vitals/ Pain       Pain Assessment Pain Assessment: Faces Pain Score: 4  Pain Location: generalized Pain Intervention(s): Limited activity within patient's tolerance, Monitored during session         Frequency  Min 1X/week        Progress Toward Goals  OT Goals(current goals can now be found in the care plan section)     Acute Rehab OT Goals OT Goal Formulation: Patient unable to participate in goal setting Time For Goal Achievement: 08/31/23 Potential to Achieve Goals: Fair  Plan         AM-PAC OT "6 Clicks" Daily Activity     Outcome Measure   Help from another person eating meals?: A Little Help from another person taking care of personal grooming?: A Little Help from another person toileting, which includes using toliet, bedpan, or urinal?: A Lot Help from another person bathing (including washing, rinsing, drying)?: A Lot Help from another person to put on and taking off regular upper body clothing?: A Lot Help from another person to put on and taking off regular lower body clothing?: Total 6 Click Score: 13    End of Session Equipment Utilized During Treatment: Gait belt;Rolling walker (2 wheels)  OT Visit Diagnosis: Unsteadiness on feet (R26.81);Muscle weakness (generalized) (M62.81);History of falling (Z91.81)   Activity Tolerance Patient tolerated treatment well   Patient Left in chair;with call bell/phone within reach;with family/visitor present   Nurse Communication Mobility status        Time: 1550-1620 OT Time Calculation (min): 30 min  Charges: OT General Charges $OT Visit: 1 Visit OT Treatments $Therapeutic Activity: 23-37 mins    Reuben Likes, OTR/L 08/22/2023, 4:32 PM

## 2023-08-22 NOTE — Progress Notes (Signed)
[facility]                                                                              Joanne Patterson, is a 89 y.o. female, DOB - Jan 18, 1928, ZOX:[id number] Admit date - 08/16/2023    Outpatient Primary MD for the patient is Jarrett Soho, PA-C  LOS - 4  days  Chief Complaint  Patient presents with   hyponatremia   abnormal labs       Brief summary   Patient is a 89 year old African-American female with past medical history significant for diet-controlled type 2 diabetes, hypertension and hyperlipidemia, recurrent UTI and bladder spasms.  Patient was seen at the nephrology office and was found to have sodium of 120.  Patient was admitted for further assessment and management.  Nephrology was consulted.    Assessment & Plan    Principal Problem: Symptomatic hyponatremia -Presented with sodium of 120, trended down to 119 -Nephrology was consulted, multifactorial, medication effect from ARB and HCTZ, CKD stage III.  Euvolemic -Cortisol level normal, urine osmolality 319, urine sodium 62, serum osmolarity 278 -Patient received tolvaptan 15 mg but did not respond, subsequently received 0.9% normal saline and improved sodium.  Recommended to avoid diuretics HCTZ/ARB. -This morning sodium again down to 125, discussed with nephrology, Dr. Glenna Fellows, recommended IV fluids trial, repeat urine osmolality -Await further recommendations   Active Problems: Hyperkalemia -K 6.5 Unclear if it was hemolysis, repeat lab checked in 2 minutes, not sure if it was a new sample -Treated with Lokelma, insulin D50, albuterol neb -K stable    Essential hypertension -BP currently stable, currently on Norvasc 10 mg daily    Hyperlipidemia Continue statin    CKD (chronic kidney disease), stage IV (HCC) -Creatinine level, at baseline, 1.3  Chronic Anemia, known history of iron deficiency -per daughter, patient follows [facility], has known history of iron  deficiency and takes iron supplement 325 mg daily -Hemoglobin 7.4, no reports of bleeding.  -Obtain anemia panel, FOBT -Hold Lovenox -Discussed with daughter, if iron too low, can give 1 dose of IV iron.  Hold off on transfusion packed RBCs for now.  Right ankle sprain -X-ray negative, mobilize with PT OT  Obesity Estimated body mass index is 33.05 kg/m as calculated from the following:   Height as of this encounter: 5\' 5"  (1.651 m).   Weight as of this encounter: 90.1 kg.  Code Status: Full CODE STATUS DVT Prophylaxis:  enoxaparin (LOVENOX) injection 30 mg Start: 08/16/23 2200-> held, placed on SCDs   Level of Care: Level of care: Progressive Family Communication: Updated patient's daughter at bed side  Disposition Plan:      Remains inpatient appropriate:   Procedures:    Consultants:   Nephrology  Antimicrobials:   Anti-infectives (From admission, onward)    Start     Dose/Rate Route Frequency Ordered Stop   08/18/23 1830  trimethoprim (TRIMPEX) tablet 50 mg        50 mg Oral Daily 08/18/23 1731     08/16/23 1615  cephALEXin (KEFLEX) capsule 500 mg        500 mg Oral Every 8 hours  08/16/23 1606 08/17/23 1339          Medications  amLODipine  10 mg Oral Daily   ascorbic acid  1,000 mg Oral Daily   aspirin EC  81 mg Oral Q M,W,F   enoxaparin (LOVENOX) injection  30 mg Subcutaneous Q24H   influenza vaccine adjuvanted  0.5 mL Intramuscular Tomorrow-1000   multivitamin with minerals  1 tablet Oral Daily   pneumococcal 20-valent conjugate vaccine  0.5 mL Intramuscular Tomorrow-1000   senna  2 tablet Oral Daily   simvastatin  20 mg Oral QPM   trimethoprim  50 mg Oral Daily      Subjective:   Joanne Patterson was seen and examined today.  Alert and awake, daughter at the bedside.  Did not sleep well last night due to pain all over in the joints.  This morning looks tired.  No nausea vomiting, abdominal pain, hematemesis, hematochezia or melena.   Objective:    Vitals:   08/21/23 0748 08/21/23 2120 08/22/23 0240 08/22/23 0500  BP:  (!) 132/49 (!) 141/43   Pulse:  87 83   Resp:  16 20   Temp:  98.9 F (37.2 C) 98.5 F (36.9 C)   TempSrc:  Oral Oral   SpO2: 100% 100% 98%   Weight:    90.1 kg  Height:        Intake/Output Summary (Last 24 hours) at 08/22/2023 1242 Last data filed at 08/22/2023 1109 Gross per 24 hour  Intake 940 ml  Output 300 ml  Net 640 ml     Wt Readings from Last 3 Encounters:  08/22/23 90.1 kg    Physical Exam General: Alert and oriented to self and person, NAD, frail Cardiovascular: S1 S2 clear, RRR.  Respiratory: CTAB Gastrointestinal: Soft, nontender, nondistended, NBS Ext: no pedal edema bilaterally Neuro: no new deficits Psych: Normal affect, appears close to her baseline   Data Reviewed:  I have personally reviewed following labs    CBC Lab Results  Component Value Date   WBC 8.0 08/22/2023   RBC 2.51 (L) 08/22/2023   HGB 7.4 (L) 08/22/2023   HCT 22.1 (L) 08/22/2023   MCV 88.0 08/22/2023   MCH 29.5 08/22/2023   PLT 212 08/22/2023   MCHC 33.5 08/22/2023   RDW 14.5 08/22/2023   LYMPHSABS 2.1 08/20/2023   MONOABS 1.1 (H) 08/20/2023   EOSABS 0.1 08/20/2023   BASOSABS 0.0 08/20/2023     Last metabolic panel Lab Results  Component Value Date   NA 125 (L) 08/22/2023   K 4.8 08/22/2023   CL 96 (L) 08/22/2023   CO2 22 08/22/2023   BUN 25 (H) 08/22/2023   CREATININE 1.38 (H) 08/22/2023   GLUCOSE 86 08/22/2023   GFRNONAA 36 (L) 08/22/2023   CALCIUM 9.0 08/22/2023   PHOS 3.3 08/21/2023   PROT 7.0 08/16/2023   ALBUMIN 2.8 (L) 08/21/2023   BILITOT 1.0 08/16/2023   ALKPHOS 80 08/16/2023   AST 40 08/16/2023   ALT 39 08/16/2023   ANIONGAP 7 08/22/2023    CBG (last 3)  Recent Labs    08/20/23 0751 08/21/23 0800 08/22/23 0759  GLUCAP 80 100* 88      Coagulation Profile: No results for input(s): "INR", "PROTIME" in the last 168 hours.   Radiology Studies: I have  personally reviewed the imaging studies  No results found.     Thad Ranger M.D. [facility] 08/22/2023, 12:42 PM  Available via Epic secure chat 7am-7pm After 7 pm, please refer to  night coverage provider listed on amion.

## 2023-08-22 NOTE — Plan of Care (Signed)
Problem: Activity: Goal: Risk for activity intolerance will decrease Outcome: Progressing   Problem: Safety: Goal: Ability to remain free from injury will improve Outcome: Progressing   Problem: Skin Integrity: Goal: Risk for impaired skin integrity will decrease Outcome: Progressing

## 2023-08-22 NOTE — Progress Notes (Addendum)
[facility] Progress Note  Reconsulted on this inpatient today who had previously been seen by Dr. Arlean Hopping for hyponatremia which initially was thought SIADH/hydrochlorothiazide/ARB related.  It did not respond to tolvaptan but did ultimately respond to isotonic saline.  Sodium improved from 119 to 129 then 133 as of yesterday PM but declined to 125 this AM prompting the reconsult.  0.9% was off since appears afternoon of 10/13.    I/Os for the admission +1.1, wt per chart quite variable appearing bed wts. She's been normotensive with HR in the 70-80s  Daughter bedside and provides much history.  She has various aches and pains due to arthritis which seem more pronounced in the past few days.  Led to poor sleep last PM + phlebotomy + VS collection so she's sleepy this afternoon but doesn't see confused or report HA, N/V.  She's had a good appetite and eating full meals + 2 ensure yesterday.   Vitals:   08/21/23 0748 08/21/23 2120 08/22/23 0240 08/22/23 0500  BP:  (!) 132/49 (!) 141/43   Pulse:  87 83   Resp:  16 20   Temp:  98.9 F (37.2 C) 98.5 F (36.9 C)   TempSrc:  Oral Oral   SpO2: 100% 100% 98%   Weight:    90.1 kg  Height:        Exam: Elderly AAF, lying calmly in bd MMM No jvd or bruits Chest clear bilat to bases RRR no RG Abd soft obese ntnd no mass or ascites +bs Ext no LE or UE edema, puffy calves/ ankles but not edema  Neuro is alert, gen'd weakness, pleasant        Renal-related home meds: - aleve - norvasc 10 - olmesartan- hydrochlorothiazide - others: statin, keflex, asa, vit/ prns      Date                          Creat               eGFR   01/16/23                      1.72   07/27/23                      1.74   08/15/23                    1.43   08/16/23                    1.32                 38   08/17/23                    1.33                 37 ml/min 08/21/24                       1.38  36      UA negative      UOsm  10/9- 242;  10/13 319      UNa 10/10- 42; 10/13 62   BNP 56    CXR - clear     Assessment/ Plan: Hyponatremia -  TSH and cortisol normal.  Hydrochlorothiazide stopped.  Initially appears concern for SIADH and vaptan x 1 dose  given but no improvement.  She ultimately responded to isotonic volume expansion but serum sodium down again today.  UOP is down a good bit too and I'm suspicious for recurrent hypovolemic hyponatremia.  Giving 0.5L NS challenge currently and will recheck serum sodium at 3pm.  Also repeat urine osm today.  Will follow closely.  CKD 3b - b/l creat 1.3, eGFR 37 ml/min from this admission. Labs from [facility] earlier this year showed creat 1.4- 1.7 range. F/b Dr. Valentino Nose. Recurrent UTI's - per pmd ; on prophy TMP HTN - currently normotensive on amlodipine 10.    ADDENDUM:  Repeat labs showing urine osm 479 today,  3pm BMP with Na 124, K 4.5, Bicarb 18, BUN 24, Cr 1.58.  bladder scan and she subsequently voided   Uosm uptrending ? SIADH worse due to pain in past day that's been worse Cr up a bit - ? Retention related - didn't look particularly dry to begin with and did receive some IVF as well.    Plan:  -Will add urea 30g BID for now and follow labs - next in AM -Consider trying to get up to [facility] for voiding if possible -cont PVR/bladder scans and if still > later today would rec inserting foley; family reluctant to do so yet which I understand in light of recent issues with UTI.       Estill Bakes MD [facility] Pager [phone]   Recent Labs  Lab 08/20/23 0507 08/21/23 0500 08/21/23 0502 08/21/23 1239 08/21/23 2303 08/22/23 0443 08/22/23 0450  HGB 7.6*  --   --   --   --  7.4*  --   ALBUMIN 2.7*  --  2.8*  --   --   --   --   CALCIUM 8.8*  8.7*   < > 9.4  --   --   --  9.0  PHOS 3.1  --  3.3  --   --   --   --   CREATININE 1.36*  1.38*   < > 1.40*  --   --   --  1.38*  K 4.8  4.7   < > 6.5*   < > 4.9  --  4.8   < > = values in this  interval not displayed.   No results for input(s): "IRON", "TIBC", "FERRITIN" in the last 168 hours. Inpatient medications:  amLODipine  10 mg Oral Daily   ascorbic acid  1,000 mg Oral Daily   aspirin EC  81 mg Oral Q M,W,F   influenza vaccine adjuvanted  0.5 mL Intramuscular Tomorrow-1000   multivitamin with minerals  1 tablet Oral Daily   pneumococcal 20-valent conjugate vaccine  0.5 mL Intramuscular Tomorrow-1000   senna  2 tablet Oral Daily   simvastatin  20 mg Oral QPM   trimethoprim  50 mg Oral Daily    sodium chloride 125 mL/hr at 08/22/23 1143    acetaminophen **OR** acetaminophen, hydrALAZINE, mouth rinse, oxyCODONE, polyvinyl alcohol

## 2023-08-23 DIAGNOSIS — I10 Essential (primary) hypertension: Secondary | ICD-10-CM | POA: Diagnosis not present

## 2023-08-23 DIAGNOSIS — E871 Hypo-osmolality and hyponatremia: Secondary | ICD-10-CM | POA: Diagnosis not present

## 2023-08-23 DIAGNOSIS — N1832 Chronic kidney disease, stage 3b: Secondary | ICD-10-CM | POA: Diagnosis not present

## 2023-08-23 DIAGNOSIS — E785 Hyperlipidemia, unspecified: Secondary | ICD-10-CM

## 2023-08-23 LAB — BASIC METABOLIC PANEL
Anion gap: 11 (ref 5–15)
BUN: 57 mg/dL — ABNORMAL HIGH (ref 8–23)
CO2: 21 mmol/L — ABNORMAL LOW (ref 22–32)
Calcium: 9.5 mg/dL (ref 8.9–10.3)
Chloride: 98 mmol/L (ref 98–111)
Creatinine, Ser: 1.63 mg/dL — ABNORMAL HIGH (ref 0.44–1.00)
GFR, Estimated: 29 mL/min — ABNORMAL LOW (ref >60)
Glucose, Bld: 92 mg/dL (ref 70–99)
Potassium: 4.8 mmol/L (ref 3.5–5.1)
Sodium: 130 mmol/L — ABNORMAL LOW (ref 135–145)

## 2023-08-23 LAB — CBC
HCT: 23.5 % — ABNORMAL LOW (ref 36.0–46.0)
Hemoglobin: 7.6 g/dL — ABNORMAL LOW (ref 12.0–15.0)
MCH: 29.1 pg (ref 26.0–34.0)
MCHC: 32.3 g/dL (ref 30.0–36.0)
MCV: 90 fL (ref 80.0–100.0)
Platelets: 218 10*3/uL (ref 150–400)
RBC: 2.61 MIL/uL — ABNORMAL LOW (ref 3.87–5.11)
RDW: 14.4 % (ref 11.5–15.5)
WBC: 8.5 10*3/uL (ref 4.0–10.5)
nRBC: 0 % (ref 0.0–0.2)

## 2023-08-23 LAB — GLUCOSE, CAPILLARY: Glucose-Capillary: 88 mg/dL (ref 70–99)

## 2023-08-23 NOTE — Plan of Care (Signed)
  Problem: Education: Goal: Knowledge of General Education information will improve Description: Including pain rating scale, medication(s)/side effects and non-pharmacologic comfort measures Outcome: Adequate for Discharge   Problem: Health Behavior/Discharge Planning: Goal: Ability to manage health-related needs will improve Outcome: Progressing   Problem: Clinical Measurements: Goal: Ability to maintain clinical measurements within normal limits will improve Outcome: Progressing Goal: Will remain free from infection Outcome: Progressing Goal: Diagnostic test results will improve Outcome: Progressing Goal: Respiratory complications will improve Outcome: Adequate for Discharge Goal: Cardiovascular complication will be avoided Outcome: Adequate for Discharge   Problem: Activity: Goal: Risk for activity intolerance will decrease Outcome: Progressing   Problem: Nutrition: Goal: Adequate nutrition will be maintained Outcome: Adequate for Discharge   Problem: Coping: Goal: Level of anxiety will decrease Outcome: Progressing   Problem: Elimination: Goal: Will not experience complications related to bowel motility Outcome: Adequate for Discharge Goal: Will not experience complications related to urinary retention Outcome: Progressing   Problem: Pain Managment: Goal: General experience of comfort will improve Outcome: Progressing   Problem: Safety: Goal: Ability to remain free from injury will improve Outcome: Progressing   Problem: Skin Integrity: Goal: Risk for impaired skin integrity will decrease Outcome: Progressing

## 2023-08-23 NOTE — Progress Notes (Signed)
Physical Therapy Treatment Patient Details Name: Joanne Patterson MRN: [id number] DOB: 07/21/1928 Today's Date: 08/23/2023   History of Present Illness 89 y.o. female with medical history significant of type 2 diabetes currently off medications, essential hypertension on amlodipine and olmesartan hydrochlorothiazide, hyperlipidemia who lives at home with her daughter has been following up for recurrent UTI and bladder spasms, she was found to have sodium level of 120 on follow-up blood test at nephrology office and sent to the emergency room. Rolled ankle at nephrology office and sustained a R ankle sprain.    PT Comments  Pt shows signs of pain in R ankle with movement, ordered lace up ankle brace, [employer]otech notified, pt has R ankle sprain. Family was encouraged to bring in lace up shoes. Pt would benefit from using bedside commode or walking to the bathroom, rather than using purewick, daughter and granddaughter in room during session and were encouraged to call nursing to assist pt to a bedside commode when pt needs to urinate. Pt tolerated increased ambulation distance of 77' with RW, ongoing verbal cues required for safe positioning in RW.     If plan is discharge home, recommend the following: A lot of help with walking and/or transfers;A lot of help with bathing/dressing/bathroom;Direct supervision/assist for medications management;Assist for transportation;Assistance with cooking/housework;Help with stairs or ramp for entrance   Can travel by private vehicle     Yes  Equipment Recommendations  Wheelchair (measurements PT);Wheelchair cushion (measurements PT);Other (comment) (R ankle lace up brace (order placed, orthotech notified))    Recommendations for Other Services       Precautions / Restrictions Precautions Precautions: Fall Precaution Comments: R ankle sprain; per family knees "give out" Required Braces or Orthoses: Other Brace Other Brace: R lace up ankle brace (ordered  10/16) Restrictions Weight Bearing Restrictions: No     Mobility  Bed Mobility Overal bed mobility: Needs Assistance Bed Mobility: Supine to Sit     Supine to sit: HOB elevated, Used rails, Min assist     General bed mobility comments: required cues for sequencing, including advancing BLE and reaching for bed rail; assist to raise trunk and pivot hips to edge of bed    Transfers Overall transfer level: Needs assistance Equipment used: Rolling walker (2 wheels) Transfers: Sit to/from Stand Sit to Stand: Mod assist, From elevated surface, +2 physical assistance           General transfer comment: VCs for hand placement, assist to power up    Ambulation/Gait Ambulation/Gait assistance: Min assist Gait Distance (Feet): 32 Feet Assistive device: Rolling walker (2 wheels)   Gait velocity: decr     General Gait Details: ongoing multi-modal cues for positioning in RW, to incr step length R   Stairs             Wheelchair Mobility     Tilt Bed    Modified Rankin (Stroke Patients Only)       Balance Overall balance assessment: History of Falls, Needs assistance Sitting-balance support: Feet supported Sitting balance-Leahy Scale: Fair     Standing balance support: During functional activity, Reliant on assistive device for balance Standing balance-Leahy Scale: Poor                              Cognition Arousal: Alert Behavior During Therapy: WFL for tasks assessed/performed Overall Cognitive Status: History of cognitive impairments - at baseline Area of Impairment: Following commands, Memory, Attention, Problem solving  Memory: Decreased short-term memory Following Commands: Follows one step commands with increased time Safety/Judgement: Decreased awareness of deficits, Decreased awareness of safety   Problem Solving: Difficulty sequencing, Decreased initiation, Requires verbal cues, Requires tactile  cues General Comments: family providing cues and pt more responsive to direction from dtr        Exercises General Exercises - Lower Extremity Ankle Circles/Pumps: AAROM, 10 reps, Supine, Right, Both, AROM, Limitations Ankle Circles/Pumps Limitations: AAROM R; AROM L; R ankle DF AROM  limited ~50%  by pain    General Comments        Pertinent Vitals/Pain Pain Assessment Faces Pain Scale: Hurts even more Breathing: occasional labored breathing, short period of hyperventilation Negative Vocalization: occasional moan/groan, low speech, negative/disapproving quality Facial Expression: sad, frightened, frown Body Language: tense, distressed pacing, fidgeting Consolability: distracted or reassured by voice/touch PAINAD Score: 5 Pain Location: R ankle, RUE with movement Pain Descriptors / Indicators: Grimacing, Guarding Pain Intervention(s): Limited activity within patient's tolerance, Monitored during session, Repositioned    Home Living                          Prior Function            PT Goals (current goals can now be found in the care plan section) Acute Rehab PT Goals PT Goal Formulation: With patient/family Time For Goal Achievement: 08/31/23 Potential to Achieve Goals: Good    Frequency    Min 1X/week      PT Plan      Co-evaluation              AM-PAC PT "6 Clicks" Mobility   Outcome Measure  Help needed turning from your back to your side while in a flat bed without using bedrails?: A Lot Help needed moving from lying on your back to sitting on the side of a flat bed without using bedrails?: A Lot Help needed moving to and from a bed to a chair (including a wheelchair)?: A Little Help needed standing up from a chair using your arms (e.g., wheelchair or bedside chair)?: A Lot Help needed to walk in hospital room?: A Little Help needed climbing 3-5 steps with a railing? : Total 6 Click Score: 13    End of Session Equipment Utilized  During Treatment: Gait belt Activity Tolerance: Patient tolerated treatment well Patient left: with call bell/phone within reach;in chair;with family/visitor present Nurse Communication: Mobility status PT Visit Diagnosis: Other abnormalities of gait and mobility (R26.89);Difficulty in walking, not elsewhere classified (R26.2)     Time: 4098-1191 PT Time Calculation (min) (ACUTE ONLY): 32 min  Charges:    $Gait Training: 8-22 mins $Therapeutic Activity: 8-22 mins PT General Charges $$ ACUTE PT VISIT: 1 Visit                     Tamala Ser PT 08/23/2023  Acute Rehabilitation Services  Office [phone]

## 2023-08-23 NOTE — Progress Notes (Addendum)
[facility] Progress Note  Doing well today - slept better last PM and less pain.  Daughter and granddaughter bedside today.  Eating very well still and no new issues.  Bladder scan was yest but she was able to void; no LUTs today.   I/Os yest 1400 / 1500.  Vitals:   08/22/23 1943 08/22/23 2045 08/23/23 0425 08/23/23 0530  BP: (!) 174/69  (!) 138/57   Pulse: 90  98   Resp: 16 18 18    Temp: 98.2 F (36.8 C)  98.2 F (36.8 C)   TempSrc: Oral     SpO2: 100%  100%   Weight:    87.8 kg  Height:        Exam: Elderly AAF, lying calmly in bd MMM No jvd or bruits Chest clear bilat to bases RRR no RG Abd soft obese ntnd no mass or ascites +bs GU purewick with yellow urine in can Ext no LE or UE edema, puffy calves/ ankles but not edema  Neuro is alert, gen'd weakness, pleasant        Renal-related home meds: - aleve - norvasc 10 - olmesartan- hydrochlorothiazide - others: statin, keflex, asa, vit/ prns      Date                          Creat               eGFR   01/16/23                      1.72   07/27/23                      1.74   08/15/23                    1.43   08/16/23                    1.32                 38   08/17/23                    1.33                 37 ml/min 08/21/24                       1.38  36      UA negative      UOsm  10/9- 242; 10/13 319      UNa 10/10- 42; 10/13 62   BNP 56    CXR - clear     Assessment/ Plan: Hyponatremia -  TSH and cortisol normal.  Hydrochlorothiazide stopped.  Initially appears concern for SIADH and vaptan x 1 dose given but no improvement.  She ultimately responded to isotonic volume expansion but serum sodium down again 10/15.  Sodium did not respond to isotonic volume infusion and Uosm up to 497 so started urea 30g BID.  Sodium this AM improved to 130.  Continue current care and check labs in AM.   CKD 3b - b/l creat 1.3, eGFR 37 ml/min from this admission. Labs from [facility] earlier this year showed  creat 1.4- 1.7 range. F/b Dr. Valentino Nose.   Cont bladder scans/I/O cath PRN. Recurrent UTI's - per pmd ; on prophy TMP HTN - currently normotensive  on amlodipine 10.       Estill Bakes MD [facility] Pager [phone]   Recent Labs  Lab 08/20/23 0507 08/21/23 0500 08/21/23 0502 08/21/23 1239 08/22/23 0443 08/22/23 0450 08/22/23 1506 08/23/23 0453  HGB 7.6*  --   --   --  7.4*  --   --  7.6*  ALBUMIN 2.7*  --  2.8*  --   --   --   --   --   CALCIUM 8.8*  8.7*   < > 9.4  --   --    < > 8.9 9.5  PHOS 3.1  --  3.3  --   --   --   --   --   CREATININE 1.36*  1.38*   < > 1.40*  --   --    < > 1.58* 1.63*  K 4.8  4.7   < > 6.5*   < >  --    < > 4.5 4.8   < > = values in this interval not displayed.   Recent Labs  Lab 08/22/23 1506  IRON 19*  TIBC 252  FERRITIN 136   Inpatient medications:  amLODipine  10 mg Oral Daily   ascorbic acid  1,000 mg Oral Daily   aspirin EC  81 mg Oral Q M,W,F   influenza vaccine adjuvanted  0.5 mL Intramuscular Tomorrow-1000   multivitamin with minerals  1 tablet Oral Daily   mouth rinse  15 mL Mouth Rinse 4 times per day   pneumococcal 20-valent conjugate vaccine  0.5 mL Intramuscular Tomorrow-1000   senna  2 tablet Oral Daily   simvastatin  20 mg Oral QPM   tamsulosin  0.4 mg Oral Daily   trimethoprim  50 mg Oral Daily   urea  30 g Oral BID      acetaminophen **OR** acetaminophen, hydrALAZINE, mouth rinse, mouth rinse, oxyCODONE, polyvinyl alcohol

## 2023-08-23 NOTE — Progress Notes (Signed)
PROGRESS NOTE    MAREA DETERT  BJY:[id number] DOB: May 01, 1928 DOA: 08/16/2023 PCP: Jarrett Soho, PA-C   Brief Narrative:  The patient is a 89 year old elderly African-American female with a past medical history significant for but not limited to diabetes mellitus type 2 is diet controlled, hypertension, hyperlipidemia, history of recurrent UTI and bladder spasms who is seen at the neurology office and was found to have a sodium 120.  She is asked to come back to the hospital for further assessment and management.  Nephrology consulted.  She is being evaluated for symptomatic hyponatremia and sodium is now slowly improving.  Nephrology is now recommending stopping hydrochlorothiazide and starting urea 30 g twice daily.  Assessment and Plan:  Symptomatic Hyponatremia -Presented with sodium of 120, trended down to 119 -Nephrology was consulted, multifactorial, medication effect from ARB and HCTZ, CKD stage IIIb.  Euvolemic -Cortisol level normal, urine osmolality 319, urine sodium 62, serum osmolarity 278 -Patient received tolvaptan 15 mg but did not respond, subsequently received 0.9% normal saline and improved sodium.  Recommended to avoid diuretics HCTZ/ARB. Inetta Fermo morning sodium again down to 125, discussed with nephrology, Dr. Glenna Fellows, recommended IV fluids trial, repeat urine osmolality -Repeat isotonic fluid hydration effusion did not cause the sodium to respond and Urine Osm was up to 497 so now nephrology recommending starting urea 30 g twice daily -Na+ Trend: Recent Labs  Lab 08/19/23 0733 08/20/23 0507 08/21/23 0500 08/21/23 0502 08/22/23 0450 08/22/23 1506 08/23/23 0453  NA 129* 127*  126* 130* 133* 125* 124* 130*  -Continue to Monitor and Trend and repeat CMP in the AM   Hyperkalemia -K+ was 6.5 Unclear if it was hemolysis, repeat lab checked in 2 minutes, not sure if it was a new sample -Treated with Lokelma, insulin D50, albuterol neb -K+ Trend: Recent Labs   Lab 08/21/23 1239 08/21/23 1648 08/21/23 1858 08/21/23 2303 08/22/23 0450 08/22/23 1506 08/23/23 0453  K 4.6 4.8 4.9 4.9 4.8 4.5 4.8  -Continue to Monitor and Trend and repeat CMP in the AM    Essential Hypertension -BP currently stable, currently on Amlodipine 10 mg po daily -C/w IV Hydralazine 10 mg q4hprn HBP for SBP >180 or DBP >110 -Continue to Monitor BP per Protocol -Last BP reading was   Acute Urinary Retention -Strict I's and O's and Daily Weights and Continue Bladder Scanning -C/w Tamsulosin 0.4 mg po Daily x 7 Days    Hyperlipidemia -Continue Simvastatin 20 mg po Daily    CKD (chronic kidney disease), stage 3b Metabolic Acidosis  -BUN/Cr Trend: Recent Labs  Lab 08/19/23 0733 08/20/23 0507 08/21/23 0500 08/21/23 0502 08/22/23 0450 08/22/23 1506 08/23/23 0453  BUN 18 22  22 23 23  25* 24* 57*  CREATININE 1.25* 1.36*  1.38* 1.38* 1.40* 1.38* 1.58* 1.63*  -Avoid Nephrotoxic Medications, Contrast Dyes, Hypotension and Dehydration to Ensure Adequate Renal Perfusion and will need to Renally Adjust Meds -Continue to Monitor and Trend Renal Function carefully and repeat CMP in the AM    Chronic Anemia, known history of iron deficiency -per daughter, patient follows [facility], has known history of iron deficiency and takes iron supplement 325 mg daily -Hgb/Hct Trend: Recent Labs  Lab 08/16/23 1220 08/20/23 0507 08/22/23 0443 08/23/23 0453  HGB 10.1* 7.6* 7.4* 7.6*  HCT 29.3* 23.6* 22.1* 23.5*  MCV 85.7 88.7 88.0 90.0  -Anemia panel done and showed an iron level of 19, UIBC of 233, TIBC 252, saturation ratios of 8%, ferritin level of 136, folate level  30.8, vitamin B12 1001 -FOBT is pending -Hold Lovenox for now -She received IV iron yesterday and will hold off transfusion of PRBCs at this time -Continue to monitor for signs and symptoms of bleeding; no overt bleeding noted -Will need continued iron supplementation at discharge -Repeat  CBC in the AM  Right Arm Swelling -? In the Setting of IV Infiltration -Elevate and Warm Compresses    Right Ankle Sprain -Per Family she twisted her Ankle PTA -X-ray done and showed "Asymmetric moderate soft tissue swelling overlying the lateral malleolus without discrete underlying fracture, concerning for soft tissue/ligamentous injury." -Mobilize with PT OT and they are recommending Home Health PT  Hypoalbuminemia -Patient's Albumin Trend: Recent Labs  Lab 08/16/23 1220 08/20/23 0507 08/21/23 0502  ALBUMIN 3.7 2.7* 2.8*  -Continue to Monitor and Trend and repeat CMP in the AM  Obesity -Complicates overall prognosis and care -Estimated body mass index is 32.21 kg/m as calculated from the following:   Height as of this encounter: 5\' 5"  (1.651 m).   Weight as of this encounter: 87.8 kg.  -Weight Loss and Dietary Counseling given   DVT prophylaxis: Place and maintain sequential compression device Start: 08/22/23 1247    Code Status: Full Code Family Communication: Discussed with Grand-daughter and Daughter at bedside  Disposition Plan:  Level of care: Progressive Status is: Inpatient Remains inpatient appropriate because: Needs Nephrology clearance   Consultants:  Nephrology   Procedures:  As delineated as above  Antimicrobials:  Anti-infectives (From admission, onward)    Start     Dose/Rate Route Frequency Ordered Stop   08/18/23 1830  trimethoprim (TRIMPEX) tablet 50 mg        50 mg Oral Daily 08/18/23 1731     08/16/23 1615  cephALEXin (KEFLEX) capsule 500 mg        500 mg Oral Every 8 hours 08/16/23 1606 08/17/23 1339       Subjective: Seen and examined at bedside she is sitting in the chair.  Complaining of some pain on the right side.  No nausea or vomiting.  Sodium is slowly trending up.  Feels okay.  Family wants to take her home and refusing SNF.  No other concerns or complaints at this time.  Objective: Vitals:   08/22/23 2045 08/23/23 0425  08/23/23 0530 08/23/23 1326  BP:  (!) 138/57  (!) 125/56  Pulse:  98  89  Resp: 18 18  18   Temp:  98.2 F (36.8 C)  98.4 F (36.9 C)  TempSrc:    Oral  SpO2:  100%  100%  Weight:   87.8 kg   Height:        Intake/Output Summary (Last 24 hours) at 08/23/2023 1457 Last data filed at 08/23/2023 0900 Gross per 24 hour  Intake 583.34 ml  Output 1469 ml  Net -885.66 ml   Filed Weights   08/20/23 0500 08/22/23 0500 08/23/23 0530  Weight: 89.2 kg 90.1 kg 87.8 kg   Examination: Physical Exam:  Constitutional: WN/WD obese elderly African-American female in no acute distress Respiratory: Diminished to auscultation bilaterally with some coarse breath sounds, no wheezing, rales, rhonchi or crackles. Normal respiratory effort and patient is not tachypenic. No accessory muscle use.  Unlabored breathing Cardiovascular: RRR, no murmurs / rubs / gallops. S1 and S2 auscultated. No extremity edema.  Abdomen: Soft, non-tender, distended secondary to body habitus. Bowel sounds positive.  GU: Deferred. Musculoskeletal: No clubbing / cyanosis of digits/nails.  Has some right ankle swelling with a ASO  on it now Neurologic: CN 2-12 grossly intact with no focal deficits. Romberg sign cerebellar reflexes not assessed.   Data Reviewed: I have personally reviewed following labs and imaging studies  CBC: Recent Labs  Lab 08/20/23 0507 08/22/23 0443 08/23/23 0453  WBC 10.0 8.0 8.5  NEUTROABS 6.6  --   --   HGB 7.6* 7.4* 7.6*  HCT 23.6* 22.1* 23.5*  MCV 88.7 88.0 90.0  PLT 189 212 218   Basic Metabolic Panel: Recent Labs  Lab 08/20/23 0507 08/21/23 0500 08/21/23 0502 08/21/23 1239 08/21/23 1858 08/21/23 2303 08/22/23 0450 08/22/23 1506 08/23/23 0453  NA 127*  126* 130* 133*  --   --   --  125* 124* 130*  K 4.8  4.7 6.5* 6.5*   < > 4.9 4.9 4.8 4.5 4.8  CL 98  98 103 105  --   --   --  96* 97* 98  CO2 22  20* 19* 19*  --   --   --  22 18* 21*  GLUCOSE 86  86 95 97  --   --   --   86 135* 92  BUN 22  22 23 23   --   --   --  25* 24* 57*  CREATININE 1.36*  1.38* 1.38* 1.40*  --   --   --  1.38* 1.58* 1.63*  CALCIUM 8.8*  8.7* 9.2 9.4  --   --   --  9.0 8.9 9.5  MG 2.1  --   --   --   --   --   --   --   --   PHOS 3.1  --  3.3  --   --   --   --   --   --    < > = values in this interval not displayed.   GFR: Estimated Creatinine Clearance: 23.6 mL/min (A) (by C-G formula based on SCr of 1.63 mg/dL (H)). Liver Function Tests: Recent Labs  Lab 08/20/23 0507 08/21/23 0502  ALBUMIN 2.7* 2.8*   No results for input(s): "LIPASE", "AMYLASE" in the last 168 hours. No results for input(s): "AMMONIA" in the last 168 hours. Coagulation Profile: No results for input(s): "INR", "PROTIME" in the last 168 hours. Cardiac Enzymes: No results for input(s): "CKTOTAL", "CKMB", "CKMBINDEX", "TROPONINI" in the last 168 hours. BNP (last 3 results) No results for input(s): "PROBNP" in the last 8760 hours. HbA1C: No results for input(s): "HGBA1C" in the last 72 hours. CBG: Recent Labs  Lab 08/19/23 0716 08/20/23 0751 08/21/23 0800 08/22/23 0759 08/23/23 0752  GLUCAP 78 80 100* 88 88   Lipid Profile: No results for input(s): "CHOL", "HDL", "LDLCALC", "TRIG", "CHOLHDL", "LDLDIRECT" in the last 72 hours. Thyroid Function Tests: No results for input(s): "TSH", "T4TOTAL", "FREET4", "T3FREE", "THYROIDAB" in the last 72 hours. Anemia Panel: Recent Labs    08/22/23 1506  VITAMINB12 1,001*  FOLATE 30.8  FERRITIN 136  TIBC 252  IRON 19*  RETICCTPCT 1.5   Sepsis Labs: No results for input(s): "PROCALCITON", "LATICACIDVEN" in the last 168 hours.  No results found for this or any previous visit (from the past 240 hour(s)).   Radiology Studies: No results found.  Scheduled Meds:  amLODipine  10 mg Oral Daily   ascorbic acid  1,000 mg Oral Daily   aspirin EC  81 mg Oral Q M,W,F   influenza vaccine adjuvanted  0.5 mL Intramuscular Tomorrow-1000   multivitamin with  minerals  1 tablet Oral Daily  mouth rinse  15 mL Mouth Rinse 4 times per day   pneumococcal 20-valent conjugate vaccine  0.5 mL Intramuscular Tomorrow-1000   senna  2 tablet Oral Daily   simvastatin  20 mg Oral QPM   tamsulosin  0.4 mg Oral Daily   trimethoprim  50 mg Oral Daily   urea  30 g Oral BID   Continuous Infusions:   LOS: 5 days   Marguerita Merles, DO [facility] Available via Epic secure chat 7am-7pm After these hours, please refer to coverage provider listed on [email or web address] 08/23/2023, 2:57 PM

## 2023-08-23 NOTE — TOC Progression Note (Addendum)
Transition of Care [facility]) - Progression Note    Patient Details  Name: Joanne Patterson MRN: [id number] Date of Birth: [birth year]  [facility] CM/SW Contact  Lanier Felty, Olegario Messier, RN Phone Number: 08/23/2023, 10:55 AM  Clinical Narrative:  Sherron Monday to dtr [name] no preference for w/c order-[employer] rep Jermaine to deliver to rm prior d/c. [facility] rep Kelly HHPT/OT. May need PTAR for transport home has steps to negotiate.MD updated.  -12:44p-added 3n1 order from [employer] per [name]'s request. [employer] to deliver 3n1 to rm prior d/c.    Expected Discharge Plan: Home w Home Health Services Barriers to Discharge: Continued Medical Work up  Expected Discharge Plan and Services   Discharge Planning Services: CM Consult Post Acute Care Choice: Resumption of Svcs/PTA Provider Living arrangements for the past 2 months: Single Family Home                 DME Arranged: Wheelchair manual DME Agency: [employer] Date DME Agency Contacted: 08/23/23 Time DME Agency Contacted: 1053 Representative spoke with at DME Agency: Vaughan Basta HH Arranged: PT, OT HH Agency: [facility] Date [facility] Agency Contacted: 08/23/23 Time HH Agency Contacted: 1053 Representative spoke with at [facility] Agency: Tresa Endo   Social Determinants of Health (SDOH) Interventions SDOH Screenings   Food Insecurity: No Food Insecurity (08/17/2023)  Housing: Low Risk  (08/17/2023)  Transportation Needs: No Transportation Needs (08/17/2023)  Utilities: Not At Risk (08/17/2023)  Tobacco Use: Low Risk  (08/17/2023)    Readmission Risk Interventions     No data to display

## 2023-08-23 NOTE — Progress Notes (Signed)
Orthopedic Tech Progress Note Patient Details:  Joanne Patterson 1928-10-27 [id number]  Ortho Devices Type of Ortho Device: ASO Ortho Device/Splint Location: right Ortho Device/Splint Interventions: Ordered, Application, Adjustment   Post Interventions Patient Tolerated: Well Instructions Provided: Adjustment of device, Care of device  Kizzie Fantasia 08/23/2023, 10:51 AM

## 2023-08-23 NOTE — Hospital Course (Addendum)
The patient is a 89 year old elderly African-American female with a past medical history significant for but not limited to diabetes mellitus type 2 is diet controlled, hypertension, hyperlipidemia, history of recurrent UTI and bladder spasms who is seen at the neurology office and was found to have a sodium 120.  She is asked to come back to the hospital for further assessment and management.  Nephrology consulted.  She is being evaluated for symptomatic hyponatremia and sodium is now slowly improving.  Nephrology is now recommending stopping hydrochlorothiazide and starting urea 30 g twice daily.  Since BUN is elevated out of proportion to her creatinine so we will need to rule out upper GI bleeding and obtain FOBT but this is likely in the setting of her urea supplements and now the urea supplements have been stopped and FOBT is negative but given 1 unit PRBCs given her blood count being low.  LFTs were abnormal so we will obtain a right upper quadrant ultrasound and acute hepatitis panel and acute hepatitis panel was negative and right upper quadrant ultrasound done and showed concerning findings and recommendation was to get a HIDA scan the HIDA scan was negative.  She also spiked a temperature 100.6 this a.m. so we will obtain blood cultures x 2 and continue to monitor.  LFTs are slowly improving and nephrology is now recommending stopping the urea as above given her significant elevated BUN.  Will need to monitor carefully.  Assessment and Plan:  Symptomatic Hyponatremia -Presented with sodium of 120, trended down to 119 -Nephrology was consulted, multifactorial, medication effect from ARB and HCTZ, CKD stage IIIb and initial concern was for SIADH.  Euvolemic -Cortisol level normal, urine osmolality 319, urine sodium 62, serum osmolarity 278 -Patient received tolvaptan 15 mg but did not respond, subsequently received 0.9% normal saline and improved sodium.  Recommended to avoid HCTZ/ARB. -Repeat  isotonic fluid hydration effusion did not cause the sodium to respond and Urine Osm was up to 497 so now nephrology was recommending Urea 30 g twice daily but this is now stopped given very Elevated BUN -Na+ Trend: Recent Labs  Lab 08/21/23 0500 08/21/23 0502 08/22/23 0450 08/22/23 1506 08/23/23 0453 08/24/23 0459 08/25/23 0501  NA 130* 133* 125* 124* 130* 130* 129*  -Continue to Monitor and Trend and repeat CMP in the AM sodium remains stable -Now nephrology is recommending starting salt tablets, fluid restriction and a dose of Furosemide 20 mg p.o. daily   Hyperkalemia, improved  -K+ was 6.5 Unclear if it was hemolysis, repeat lab checked in 2 minutes, not sure if it was a new sample -Treated with Lokelma, insulin D50, albuterol neb -K+ Trend: Recent Labs  Lab 08/21/23 1858 08/21/23 2303 08/22/23 0450 08/22/23 1506 08/23/23 0453 08/24/23 0459 08/25/23 0501  K 4.9 4.9 4.8 4.5 4.8 4.8 4.7  -Continue to Monitor and Trend and repeat CMP in the AM    Essential Hypertension -BP currently stable, currently on Amlodipine 10 mg po daily -C/w IV Hydralazine 10 mg q4hprn HBP for SBP >180 or DBP >110 -Continue to Monitor BP per Protocol -Last BP reading was 134/44  Acute Urinary Retention -Strict I's and O's and Daily Weights and Continue Bladder Scanning -C/w Tamsulosin 0.4 mg po Daily x 7 Days    Hyperlipidemia -Continue Simvastatin 20 mg po Daily    CKD (Chronic Kidney Disease), Stage 3b Metabolic Acidosis  Elevated BUN -Has a baseline creatinine of 1.4-1.7 and follows with Dr. Valentino Nose in the clinic -BUN/Cr Trend: Recent Labs  Lab 08/21/23 0500 08/21/23 0502 08/22/23 0450 08/22/23 1506 08/23/23 0453 08/24/23 0459 08/25/23 0501  BUN 23 23 25* 24* 57* 104* 141*  CREATININE 1.38* 1.40* 1.38* 1.58* 1.63* 1.54* 1.76*  -Avoid Nephrotoxic Medications, Contrast Dyes, Hypotension and Dehydration to Ensure Adequate Renal Perfusion and will need to Renally Adjust  Meds -BUN/Cr is out of proportion compared to creatinine and this is likely related to her Urea supplements but given that her hemoglobin slightly dropped we will need to rule out upper GI bleeding -Nephrology now recommending also holding the trimethoprim -Continue to Monitor and Trend Renal Function carefully and repeat CMP in the AM    Chronic Anemia, known history of iron deficiency -Per daughter, patient follows [facility], has known history of iron deficiency and takes iron supplement 325 mg daily -Hgb/Hct Trend: Recent Labs  Lab 08/16/23 1220 08/20/23 0507 08/22/23 0443 08/23/23 0453 08/24/23 0459 08/25/23 0501  HGB 10.1* 7.6* 7.4* 7.6* 7.0* 6.9*  HCT 29.3* 23.6* 22.1* 23.5* 20.7* 20.5*  MCV 85.7 88.7 88.0 90.0 87.7 87.2  -Anemia panel done and showed an iron level of 19, UIBC of 233, TIBC 252, saturation ratios of 8%, ferritin level of 136, folate level 30.8, vitamin B12 1001 -FOBT is NEGATIVE -Hold Lovenox for now -Will need to R/o Upper GIB given worsened BUN and Anemia -She received IV iron infusion this hospitalization and will get 1 unit of pRBC -Continue to monitor for signs and symptoms of bleeding; no overt bleeding noted -Nephrology -Will need continued iron supplementation at discharge -Repeat CBC in the AM and if hemoglobin continues to remain low may need GI involvement  Fever -Low Grade at 100.6 yesterday morning  -? Related to all the blankets patient had but will check Blood Cx x2 and RUQ U/S -Recent urinalysis done showed large hemoglobin, trace leukocytes, negative nitrites, rare bacteria, 21-50 RBCs per high-power field, 0-5 squamous epithelial cells and 0-5 WBCs -Consider repeating if she continues to have fevers but she has not had a fever today. -Blood cultures x 2 obtained and 1 out of the 3 showed gram-positive rods which is likely contaminant -Chest x-ray done and showed "No active disease. Aortic Atherosclerosis" -Check PCT and  Lactic Acidosis in the AM -Continue to Monitor For S/Sx of Infection  Abnormal LFTs, slowly improving  -Unclear and possibly Reactive? -LFT Trend: Recent Labs  Lab 08/16/23 1220 08/24/23 0459 08/25/23 0501  AST 40 421* 317*  ALT 39 444* 377*  BILITOT 1.0 0.7 0.4  ALKPHOS 80 214* 225*  -Check RUQ U/S and showed "Gallbladder wall thickening without a reported Murphy sign or gallstones. Findings are indeterminate but could reflect cholecystitis in the correct clinical setting. A HIDA scan could further evaluate clinically warranted.. Diffusely increased echogenicity of the liver, which is most commonly secondary to hepatic steatosis." -Acute Hepatitis Panel which is NEGATIVE -Continue to Monitor and Trend Hepatic Fxn Panel and repeat in the AM -HIDA done and showed "Patent cystic and common bile ducts. Normal gallbladder ejection fraction."  Right Arm Swelling -? In the Setting of IV Infiltration -Continue Elevate and Warm Compresses    Right Ankle Sprain -Per Family she twisted her Ankle PTA -X-ray done and showed "Asymmetric moderate soft tissue swelling overlying the lateral malleolus without discrete underlying fracture, concerning for soft tissue/ligamentous injury." -Mobilize with PT OT and they are recommending Home Health PT  Constipation -C/w Bowel Regimen and add Bisacodyl Suppository since she still has not had a bowel movement  Hypoalbuminemia -Patient's Albumin Trend:  Recent Labs  Lab 08/16/23 1220 08/20/23 0507 08/21/23 0502 08/24/23 0459 08/25/23 0501  ALBUMIN 3.7 2.7* 2.8* 2.3* 2.5*  -Continue to Monitor and Trend and repeat CMP in the AM  Obesity -Complicates overall prognosis and care -Estimated body mass index is 29.64 kg/m as calculated from the following:   Height as of this encounter: 5\' 5"  (1.651 m).   Weight as of this encounter: 80.8 kg.  -Weight Loss and Dietary Counseling given

## 2023-08-24 ENCOUNTER — Inpatient Hospital Stay (HOSPITAL_COMMUNITY): Payer: Medicare Other

## 2023-08-24 DIAGNOSIS — I10 Essential (primary) hypertension: Secondary | ICD-10-CM | POA: Diagnosis not present

## 2023-08-24 DIAGNOSIS — R799 Abnormal finding of blood chemistry, unspecified: Secondary | ICD-10-CM

## 2023-08-24 DIAGNOSIS — E785 Hyperlipidemia, unspecified: Secondary | ICD-10-CM | POA: Diagnosis not present

## 2023-08-24 DIAGNOSIS — E871 Hypo-osmolality and hyponatremia: Secondary | ICD-10-CM | POA: Diagnosis not present

## 2023-08-24 DIAGNOSIS — N1832 Chronic kidney disease, stage 3b: Secondary | ICD-10-CM | POA: Diagnosis not present

## 2023-08-24 DIAGNOSIS — R509 Fever, unspecified: Secondary | ICD-10-CM

## 2023-08-24 DIAGNOSIS — R7989 Other specified abnormal findings of blood chemistry: Secondary | ICD-10-CM

## 2023-08-24 LAB — HEPATITIS PANEL, ACUTE
HCV Ab: NONREACTIVE
Hep A IgM: NONREACTIVE
Hep B C IgM: NONREACTIVE
Hepatitis B Surface Ag: NONREACTIVE

## 2023-08-24 LAB — CBC WITH DIFFERENTIAL/PLATELET
Abs Immature Granulocytes: 0.06 10*3/uL (ref 0.00–0.07)
Basophils Absolute: 0 10*3/uL (ref 0.0–0.1)
Basophils Relative: 0 %
Eosinophils Absolute: 0.1 10*3/uL (ref 0.0–0.5)
Eosinophils Relative: 1 %
HCT: 20.7 % — ABNORMAL LOW (ref 36.0–46.0)
Hemoglobin: 7 g/dL — ABNORMAL LOW (ref 12.0–15.0)
Immature Granulocytes: 1 %
Lymphocytes Relative: 18 %
Lymphs Abs: 1.5 10*3/uL (ref 0.7–4.0)
MCH: 29.7 pg (ref 26.0–34.0)
MCHC: 33.8 g/dL (ref 30.0–36.0)
MCV: 87.7 fL (ref 80.0–100.0)
Monocytes Absolute: 0.8 10*3/uL (ref 0.1–1.0)
Monocytes Relative: 9 %
Neutro Abs: 6 10*3/uL (ref 1.7–7.7)
Neutrophils Relative %: 71 %
Platelets: 218 10*3/uL (ref 150–400)
RBC: 2.36 MIL/uL — ABNORMAL LOW (ref 3.87–5.11)
RDW: 14.5 % (ref 11.5–15.5)
WBC: 8.5 10*3/uL (ref 4.0–10.5)
nRBC: 0 % (ref 0.0–0.2)

## 2023-08-24 LAB — CK: Total CK: 405 U/L — ABNORMAL HIGH (ref 38–234)

## 2023-08-24 LAB — COMPREHENSIVE METABOLIC PANEL
ALT: 444 U/L — ABNORMAL HIGH (ref 0–44)
AST: 421 U/L — ABNORMAL HIGH (ref 15–41)
Albumin: 2.3 g/dL — ABNORMAL LOW (ref 3.5–5.0)
Alkaline Phosphatase: 214 U/L — ABNORMAL HIGH (ref 38–126)
Anion gap: 9 (ref 5–15)
BUN: 104 mg/dL — ABNORMAL HIGH (ref 8–23)
CO2: 23 mmol/L (ref 22–32)
Calcium: 9.4 mg/dL (ref 8.9–10.3)
Chloride: 98 mmol/L (ref 98–111)
Creatinine, Ser: 1.54 mg/dL — ABNORMAL HIGH (ref 0.44–1.00)
GFR, Estimated: 31 mL/min — ABNORMAL LOW (ref >60)
Glucose, Bld: 104 mg/dL — ABNORMAL HIGH (ref 70–99)
Potassium: 4.8 mmol/L (ref 3.5–5.1)
Sodium: 130 mmol/L — ABNORMAL LOW (ref 135–145)
Total Bilirubin: 0.7 mg/dL (ref 0.3–1.2)
Total Protein: 5.6 g/dL — ABNORMAL LOW (ref 6.5–8.1)

## 2023-08-24 LAB — MAGNESIUM: Magnesium: 2.3 mg/dL (ref 1.7–2.4)

## 2023-08-24 LAB — PHOSPHORUS: Phosphorus: 3.9 mg/dL (ref 2.5–4.6)

## 2023-08-24 LAB — GLUCOSE, CAPILLARY
Glucose-Capillary: 91 mg/dL (ref 70–99)
Glucose-Capillary: 174 mg/dL — ABNORMAL HIGH (ref 70–99)
Glucose-Capillary: 186 mg/dL — ABNORMAL HIGH (ref 70–99)

## 2023-08-24 MED ORDER — CHLORHEXIDINE GLUCONATE CLOTH 2 % EX PADS
6.0000 | MEDICATED_PAD | Freq: Every day | CUTANEOUS | Status: DC
  Administered 2023-08-24 – 2023-08-25 (×2): 6 via TOPICAL

## 2023-08-24 NOTE — Progress Notes (Signed)
[facility] Progress Note  T 100.6 overnight and has a transaminitis today that's new.  Serum sodium stable at 130.  I/Os yest 1000 / 1875.  Hb 7s (was 7.6 on 10/13 now 7.0).  Joanne Patterson remain at bedside and say overall she's ok - good appetite remains, still various aches and pains in the bed but nothing new.  They think the elevated temp was too many blankets. No new GI symptoms.   Vitals:   08/24/23 0619 08/24/23 0830 08/24/23 0936 08/24/23 1126  BP:   (!) 144/45 (!) 134/44  Pulse: 91   95  Resp:    19  Temp:  99.1 F (37.3 C)  98.9 F (37.2 C)  TempSrc:  Oral  Oral  SpO2: 100%   100%  Weight:      Height:        Exam: Elderly AAF, lying calmly in bed MMM No jvd or bruits Chest clear bilat to bases RRR no RG Abd soft obese ntnd no mass or ascites +bs GU purewick with yellow urine in can Ext no LE or UE edema, puffy calves/ ankles but not edema  Neuro is alert, gen'd weakness, pleasant        Renal-related home meds: - aleve - norvasc 10 - olmesartan- hydrochlorothiazide - others: statin, keflex, asa, vit/ prns      Date                          Creat               eGFR   01/16/23                      1.72   07/27/23                      1.74   08/15/23                    1.43   08/16/23                    1.32                 38   08/17/23                    1.33                 37 ml/min 08/21/24                       1.38  36      UA negative      UOsm  10/9- 242; 10/13 319      UNa 10/10- 42; 10/13 62   BNP 56    CXR - clear     Assessment/ Plan: Hyponatremia -  TSH and cortisol normal.  Hydrochlorothiazide stopped.  Initially appears concern for SIADH and vaptan x 1 dose given but no improvement.  She ultimately responded to isotonic volume expansion but serum sodium down again 10/15.  Sodium did not respond to isotonic volume infusion and Uosm up to 497 so started urea 30g BID PM 10/15.  Sdoium improved to 130 and this AM is  stable at 130.  Continue current care and check labs in AM.   Elevated BUN - most likely just related to urea supplement but with Hb slightly down  primary checking hemoccult which is very reasonable.  CKD 3b - b/l creat 1.3, eGFR 37 ml/min from this admission. Labs from [facility] earlier this year showed creat 1.4- 1.7 range. F/b Dr. Valentino Nose.   Cont bladder scans/I/O cath PRN. Recurrent UTI's - per pmd ; on prophy TMP HTN - currently normotensive on amlodipine 10.   Transaminitis:  new today - CK 454 but transaminitis out of proportion to this.  RUQ [place] pending.  Fever T100.6 overnight, RUQ [place] and blood cultures pending. Consider urine culture.    Will follow.     Estill Bakes MD [facility] Pager [phone]   Recent Labs  Lab 08/21/23 0502 08/21/23 1239 08/23/23 0453 08/24/23 0459  HGB  --    < > 7.6* 7.0*  ALBUMIN 2.8*  --   --  2.3*  CALCIUM 9.4   < > 9.5 9.4  PHOS 3.3  --   --  3.9  CREATININE 1.40*   < > 1.63* 1.54*  K 6.5*   < > 4.8 4.8   < > = values in this interval not displayed.   Recent Labs  Lab 08/22/23 1506  IRON 19*  TIBC 252  FERRITIN 136   Inpatient medications:  amLODipine  10 mg Oral Daily   ascorbic acid  1,000 mg Oral Daily   aspirin EC  81 mg Oral Q M,W,F   Chlorhexidine Gluconate Cloth  6 each Topical Daily   influenza vaccine adjuvanted  0.5 mL Intramuscular Tomorrow-1000   multivitamin with minerals  1 tablet Oral Daily   mouth rinse  15 mL Mouth Rinse 4 times per day   pneumococcal 20-valent conjugate vaccine  0.5 mL Intramuscular Tomorrow-1000   senna  2 tablet Oral Daily   simvastatin  20 mg Oral QPM   tamsulosin  0.4 mg Oral Daily   trimethoprim  50 mg Oral Daily   urea  30 g Oral BID      acetaminophen **OR** acetaminophen, hydrALAZINE, mouth rinse, mouth rinse, oxyCODONE, polyvinyl alcohol

## 2023-08-24 NOTE — Progress Notes (Signed)
Occupational Therapy Treatment Patient Details Name: Joanne Patterson MRN: [id number] DOB: 17-Apr-1928 Today's Date: 08/24/2023   History of present illness 89 yr old female with medical history significant of type 2 diabetes currently off medications, essential hypertension on amlodipine and olmesartan hydrochlorothiazide, hyperlipidemia who lives at home with her daughter has been following up for recurrent UTI and bladder spasms, she was found to have sodium level of 120 on follow-up blood test at nephrology office and sent to the emergency room. Rolled ankle at nephrology office and sustained a R ankle sprain.   OT comments  The pt was seen for functional strengthening and progression of out of bed activity, as such is needed to prep her for progressive ADL performance. She required occasional reinforcement and reassurance of her abilities, as she appeared fearful of falling and progressive activity, though this was improved today.  She then required assist for sit to stand from chair using a RW, then ambulation into the hall using a RW. Pt's daughter was present during session. The pt's daughter desires to take the pt home with home therapy upon discharge, rather than pursue SNF rehab.       If plan is discharge home, recommend the following:  A lot of help with bathing/dressing/bathroom;A lot of help with walking and/or transfers;Help with stairs or ramp for entrance;Assist for transportation;Supervision due to cognitive status;Direct supervision/assist for medications management   Equipment Recommendations  Other (comment)    Recommendations for Other Services      Precautions / Restrictions Precautions Precautions: Fall Restrictions Weight Bearing Restrictions: No       Mobility Bed Mobility        General bed mobility comments: pt was received seated in chair    Transfers Overall transfer level: Needs assistance Equipment used: Rolling walker (2 wheels) Transfers: Sit  to/from Stand Sit to Stand: Mod assist, From elevated surface, +2 physical assistance                     ADL either performed or assessed with clinical judgement   ADL Overall ADL's : Needs assistance/impaired Eating/Feeding: Minimal assistance;Sitting Eating/Feeding Details (indicate cue type and reason): based on clinical judgement Grooming: Minimal assistance;Sitting;Cueing for sequencing Grooming Details (indicate cue type and reason): simulated         Upper Body Dressing : Moderate assistance;Sitting;Cueing for sequencing Upper Body Dressing Details (indicate cue type and reason): required assist for donning a hospital gown seated EOB Lower Body Dressing: Total assistance Lower Body Dressing Details (indicate cue type and reason): for sock management                               Cognition Arousal: Alert Behavior During Therapy: WFL for tasks assessed/performed Overall Cognitive Status: History of cognitive impairments - at baseline Area of Impairment: Following commands, Memory, Attention, Problem solving          Memory: Decreased short-term memory Following Commands: Follows one step commands with increased time (and occasional repetition of prompts)     Problem Solving: Difficulty sequencing, Decreased initiation, Requires verbal cues, Requires tactile cues                     Pertinent Vitals/ Pain       Pain Assessment Pain Assessment: No/denies pain         Frequency  Min 1X/week        Progress Toward Goals  OT Goals(current goals can now be found in the care plan section)  Progress towards OT goals: Progressing toward goals  Acute Rehab OT Goals OT Goal Formulation: With family Time For Goal Achievement: 08/31/23 Potential to Achieve Goals: Fair  Plan         AM-PAC OT "6 Clicks" Daily Activity     Outcome Measure   Help from another person eating meals?: A Little Help from another person taking care of  personal grooming?: A Little Help from another person toileting, which includes using toliet, bedpan, or urinal?: A Lot Help from another person bathing (including washing, rinsing, drying)?: A Lot Help from another person to put on and taking off regular upper body clothing?: A Lot Help from another person to put on and taking off regular lower body clothing?: Total 6 Click Score: 13    End of Session Equipment Utilized During Treatment: Gait belt;Rolling walker (2 wheels)  OT Visit Diagnosis: Unsteadiness on feet (R26.81);Muscle weakness (generalized) (M62.81);History of falling (Z91.81)   Activity Tolerance Patient tolerated treatment well   Patient Left in chair;with call bell/phone within reach;with family/visitor present   Nurse Communication Mobility status        Time: 9528-4132 OT Time Calculation (min): 16 min  Charges: OT General Charges $OT Visit: 1 Visit OT Treatments $Therapeutic Activity: 8-22 mins    Reuben Likes, OTR/L 08/24/2023, 5:38 PM

## 2023-08-24 NOTE — Progress Notes (Signed)
PROGRESS NOTE    Joanne Patterson  YQM:[id number] DOB: 10/02/1928 DOA: 08/16/2023 PCP: Jarrett Soho, PA-C   Brief Narrative:  The patient is a 89 year old elderly African-American female with a past medical history significant for but not limited to diabetes mellitus type 2 is diet controlled, hypertension, hyperlipidemia, history of recurrent UTI and bladder spasms who is seen at the neurology office and was found to have a sodium 120.  She is asked to come back to the hospital for further assessment and management.  Nephrology consulted.  She is being evaluated for symptomatic hyponatremia and sodium is now slowly improving.  Nephrology is now recommending stopping hydrochlorothiazide and starting urea 30 g twice daily.  Since BUN is elevated out of proportion to her creatinine so we will need to rule out upper GI bleeding and obtain FOBT but this is likely in the setting of her urea supplements.  LFTs were abnormal so we will obtain a right upper quadrant ultrasound and acute hepatitis panel.  She also spiked a temperature 100.6 this a.m. so we will obtain blood cultures x 2 and continue to monitor.  Assessment and Plan:  Symptomatic Hyponatremia, stable now -Presented with sodium of 120, trended down to 119 -Nephrology was consulted, multifactorial, medication effect from ARB and HCTZ, CKD stage IIIb.  Euvolemic -Cortisol level normal, urine osmolality 319, urine sodium 62, serum osmolarity 278 -Patient received tolvaptan 15 mg but did not respond, subsequently received 0.9% normal saline and improved sodium.  Recommended to avoid diuretics HCTZ/ARB. Inetta Fermo morning sodium again down to 125, discussed with nephrology, Dr. Glenna Fellows, recommended IV fluids trial, repeat urine osmolality -Repeat isotonic fluid hydration effusion did not cause the sodium to respond and Urine Osm was up to 497 so now nephrology recommending Urea 30 g twice daily -Na+ Trend: Recent Labs  Lab 08/20/23 0507  08/21/23 0500 08/21/23 0502 08/22/23 0450 08/22/23 1506 08/23/23 0453 08/24/23 0459  NA 127*  126* 130* 133* 125* 124* 130* 130*  -Continue to Monitor and Trend and repeat CMP in the AM sodium remains stable   Hyperkalemia, improved  -K+ was 6.5 Unclear if it was hemolysis, repeat lab checked in 2 minutes, not sure if it was a new sample -Treated with Lokelma, insulin D50, albuterol neb -K+ Trend: Recent Labs  Lab 08/21/23 1648 08/21/23 1858 08/21/23 2303 08/22/23 0450 08/22/23 1506 08/23/23 0453 08/24/23 0459  K 4.8 4.9 4.9 4.8 4.5 4.8 4.8  -Continue to Monitor and Trend and repeat CMP in the AM    Essential Hypertension -BP currently stable, currently on Amlodipine 10 mg po daily -C/w IV Hydralazine 10 mg q4hprn HBP for SBP >180 or DBP >110 -Continue to Monitor BP per Protocol -Last BP reading was 134/44  Acute Urinary Retention -Strict I's and O's and Daily Weights and Continue Bladder Scanning -C/w Tamsulosin 0.4 mg po Daily x 7 Days    Hyperlipidemia -Continue Simvastatin 20 mg po Daily    CKD (chronic kidney disease), stage 3b Metabolic Acidosis  Elevated BUN -BUN/Cr Trend: Recent Labs  Lab 08/20/23 0507 08/21/23 0500 08/21/23 0502 08/22/23 0450 08/22/23 1506 08/23/23 0453 08/24/23 0459  BUN 22  22 23 23  25* 24* 57* 104*  CREATININE 1.36*  1.38* 1.38* 1.40* 1.38* 1.58* 1.63* 1.54*  -Avoid Nephrotoxic Medications, Contrast Dyes, Hypotension and Dehydration to Ensure Adequate Renal Perfusion and will need to Renally Adjust Meds -Urine is out of proportion compared to creatinine and this is likely related to her Urea supplements but given that her  hemoglobin slightly dropped we will need to rule out upper GI bleeding -Continue to Monitor and Trend Renal Function carefully and repeat CMP in the AM    Chronic Anemia, known history of iron deficiency -per daughter, patient follows [facility], has known history of iron deficiency and takes  iron supplement 325 mg daily -Hgb/Hct Trend: Recent Labs  Lab 08/16/23 1220 08/20/23 0507 08/22/23 0443 08/23/23 0453 08/24/23 0459  HGB 10.1* 7.6* 7.4* 7.6* 7.0*  HCT 29.3* 23.6* 22.1* 23.5* 20.7*  MCV 85.7 88.7 88.0 90.0 87.7  -Anemia panel done and showed an iron level of 19, UIBC of 233, TIBC 252, saturation ratios of 8%, ferritin level of 136, folate level 30.8, vitamin B12 1001 -FOBT is pending still -Hold Lovenox for now -She received IV iron yesterday and will hold off transfusion of PRBCs at this time -Continue to monitor for signs and symptoms of bleeding; no overt bleeding noted -Will need continued iron supplementation at discharge -Repeat CBC in the AM  Fever -Low Grade at 100.6 -? Related to all the blankets patient had but will check Blood Cx x2 and RUQ U/S -If persists will check a Urinalysis, CXR, and Urine Cx -Check PCT and LA in the AM -Continue to Monitor For S/Sx of Infection  Abnormal LFTs -Unclear and possibly Reactive? -LFT Trend: Recent Labs  Lab 08/16/23 1220 08/24/23 0459  AST 40 421*  ALT 39 444*  BILITOT 1.0 0.7  ALKPHOS 80 214*  -Check RUQ U/S and showed "Gallbladder wall thickening without a reported Murphy sign or gallstones. Findings are indeterminate but could reflect cholecystitis in the correct clinical setting. A HIDA scan could further evaluate clinically warranted.. Diffusely increased echogenicity of the liver, which is most commonly secondary to hepatic steatosis." -Acute Hepatitis Panel which is pending -Continue to Monitor and Trend Hepatic Fxn Panel and repeat in the AM -Will get a HIDA Scan in the AM  Right Arm Swelling -? In the Setting of IV Infiltration -Continue Elevate and Warm Compresses    Right Ankle Sprain -Per Family she twisted her Ankle PTA -X-ray done and showed "Asymmetric moderate soft tissue swelling overlying the lateral malleolus without discrete underlying fracture, concerning for soft tissue/ligamentous  injury." -Mobilize with PT OT and they are recommending Home Health PT  Constipation -C/w Bowel Regimen  Hypoalbuminemia -Patient's Albumin Trend: Recent Labs  Lab 08/16/23 1220 08/20/23 0507 08/21/23 0502 08/24/23 0459  ALBUMIN 3.7 2.7* 2.8* 2.3*  -Continue to Monitor and Trend and repeat CMP in the AM  Obesity -Complicates overall prognosis and care -Estimated body mass index is 32.43 kg/m as calculated from the following:   Height as of this encounter: 5\' 5"  (1.651 m).   Weight as of this encounter: 88.4 kg.  -Weight Loss and Dietary Counseling given   DVT prophylaxis: Place and maintain sequential compression device Start: 08/22/23 1247    Code Status: Full Code Family Communication: Discussed with the daughter and the granddaughter at bedside  Disposition Plan:  Level of care: Progressive Status is: Inpatient Remains inpatient appropriate because: Needs further evaluation of LFTs and Fever and needs to be cleared by Nephrology prior to D/C   Consultants:  Nephrology  Procedures:  As delineated as above  Antimicrobials:  Anti-infectives (From admission, onward)    Start     Dose/Rate Route Frequency Ordered Stop   08/18/23 1830  trimethoprim (TRIMPEX) tablet 50 mg        50 mg Oral Daily 08/18/23 1731  08/16/23 1615  cephALEXin (KEFLEX) capsule 500 mg        500 mg Oral Every 8 hours 08/16/23 1606 08/17/23 1339       Subjective: Seen and examined at bedside and she is resting in the bed and denied any complaints but patient's daughter states that she complains of being "sore".  No nausea or vomiting.  Sodium is stable.  Objective: Vitals:   08/24/23 0619 08/24/23 0830 08/24/23 0936 08/24/23 1126  BP:   (!) 144/45 (!) 134/44  Pulse: 91   95  Resp:    19  Temp:  99.1 F (37.3 C)  98.9 F (37.2 C)  TempSrc:  Oral  Oral  SpO2: 100%   100%  Weight:      Height:        Intake/Output Summary (Last 24 hours) at 08/24/2023 1421 Last data filed at  08/24/2023 1230 Gross per 24 hour  Intake 720 ml  Output 2375 ml  Net -1655 ml   Filed Weights   08/22/23 0500 08/23/23 0530 08/24/23 0500  Weight: 90.1 kg 87.8 kg 88.4 kg   Examination: Physical Exam:  Constitutional: WN/WD obese elderly African-American female in no acute distress Respiratory: Diminished to auscultation bilaterally, no wheezing, rales, rhonchi or crackles. Normal respiratory effort and patient is not tachypenic. No accessory muscle use.  Unlabored breathing Cardiovascular: RRR, no murmurs / rubs / gallops. S1 and S2 auscultated.  Minimal extremity edema Abdomen: Soft, non-tender, distended secondary to body habitus. Bowel sounds positive.  GU: Deferred. Musculoskeletal: No clubbing / cyanosis of digits/nails.  Has an ASO on her right ankle Skin: No rashes, lesions, ulcers limited skin evaluation. No induration; Warm and dry.  Neurologic: CN 2-12 grossly intact with no focal deficits. Romberg sign and cerebellar reflexes not assessed.   Data Reviewed: I have personally reviewed following labs and imaging studies  CBC: Recent Labs  Lab 08/20/23 0507 08/22/23 0443 08/23/23 0453 08/24/23 0459  WBC 10.0 8.0 8.5 8.5  NEUTROABS 6.6  --   --  6.0  HGB 7.6* 7.4* 7.6* 7.0*  HCT 23.6* 22.1* 23.5* 20.7*  MCV 88.7 88.0 90.0 87.7  PLT 189 212 218 218   Basic Metabolic Panel: Recent Labs  Lab 08/20/23 0507 08/21/23 0500 08/21/23 0502 08/21/23 1239 08/21/23 2303 08/22/23 0450 08/22/23 1506 08/23/23 0453 08/24/23 0459  NA 127*  126*   < > 133*  --   --  125* 124* 130* 130*  K 4.8  4.7   < > 6.5*   < > 4.9 4.8 4.5 4.8 4.8  CL 98  98   < > 105  --   --  96* 97* 98 98  CO2 22  20*   < > 19*  --   --  22 18* 21* 23  GLUCOSE 86  86   < > 97  --   --  86 135* 92 104*  BUN 22  22   < > 23  --   --  25* 24* 57* 104*  CREATININE 1.36*  1.38*   < > 1.40*  --   --  1.38* 1.58* 1.63* 1.54*  CALCIUM 8.8*  8.7*   < > 9.4  --   --  9.0 8.9 9.5 9.4  MG 2.1  --   --    --   --   --   --   --  2.3  PHOS 3.1  --  3.3  --   --   --   --   --  3.9   < > = values in this interval not displayed.   GFR: Estimated Creatinine Clearance: 25.1 mL/min (A) (by C-G formula based on SCr of 1.54 mg/dL (H)). Liver Function Tests: Recent Labs  Lab 08/20/23 0507 08/21/23 0502 08/24/23 0459  AST  --   --  421*  ALT  --   --  444*  ALKPHOS  --   --  214*  BILITOT  --   --  0.7  PROT  --   --  5.6*  ALBUMIN 2.7* 2.8* 2.3*   No results for input(s): "LIPASE", "AMYLASE" in the last 168 hours. No results for input(s): "AMMONIA" in the last 168 hours. Coagulation Profile: No results for input(s): "INR", "PROTIME" in the last 168 hours. Cardiac Enzymes: Recent Labs  Lab 08/24/23 0459  CKTOTAL 405*   BNP (last 3 results) No results for input(s): "PROBNP" in the last 8760 hours. HbA1C: No results for input(s): "HGBA1C" in the last 72 hours. CBG: Recent Labs  Lab 08/21/23 0800 08/22/23 0759 08/23/23 0752 08/24/23 0741 08/24/23 1131  GLUCAP 100* 88 88 91 174*   Lipid Profile: No results for input(s): "CHOL", "HDL", "LDLCALC", "TRIG", "CHOLHDL", "LDLDIRECT" in the last 72 hours. Thyroid Function Tests: No results for input(s): "TSH", "T4TOTAL", "FREET4", "T3FREE", "THYROIDAB" in the last 72 hours. Anemia Panel: Recent Labs    08/22/23 1506  VITAMINB12 1,001*  FOLATE 30.8  FERRITIN 136  TIBC 252  IRON 19*  RETICCTPCT 1.5   Sepsis Labs: No results for input(s): "PROCALCITON", "LATICACIDVEN" in the last 168 hours.  Recent Results (from the past 240 hour(s))  Culture, blood (Routine X 2) w Reflex to ID Panel     Status: None (Preliminary result)   Collection Time: 08/24/23  8:57 AM   Specimen: BLOOD RIGHT ARM  Result Value Ref Range Status   Specimen Description   Final    BLOOD RIGHT ARM Performed at [facility], [address], [place], [place] [address]    Special Requests   Final    BOTTLES DRAWN AEROBIC AND ANAEROBIC Blood Culture  adequate volume Performed at [facility], [address], [place], [place] [address]    Culture PENDING  Incomplete   Report Status PENDING  Incomplete  Culture, blood (Routine X 2) w Reflex to ID Panel     Status: None (Preliminary result)   Collection Time: 08/24/23  8:57 AM   Specimen: BLOOD RIGHT HAND  Result Value Ref Range Status   Specimen Description   Final    BLOOD RIGHT HAND Performed at [facility], [address], [place], [place] [address]    Special Requests   Final    BOTTLES DRAWN AEROBIC ONLY Blood Culture results may not be optimal due to an inadequate volume of blood received in culture bottles Performed at [facility], [address] [place], [place] [address]    Culture PENDING  Incomplete   Report Status PENDING  Incomplete    Radiology Studies: US Abdomen Limited RUQ (LIVER/GB)  Result Date: 08/24/2023 CLINICAL DATA:  [id number] Abnormal LFTs [id number] EXAM: ULTRASOUND ABDOMEN LIMITED RIGHT UPPER QUADRANT COMPARISON:  None Available. FINDINGS: Gallbladder: Gallbladder wall thickening, measuring up to 6 mm. No sonographic Murphy sign noted by sonographer. No gallstones identified. Common bile duct: Diameter: 2.7 mm within normal limits. Liver: No focal lesion identified. Increased echogenicity diffusely. Portal vein is patent on color Doppler imaging with normal direction of blood flow towards the liver.  IMPRESSION: 1. Gallbladder wall thickening without a reported Murphy sign or gallstones. Findings are indeterminate but could reflect cholecystitis in the correct clinical setting. A HIDA scan could further evaluate clinically warranted. 2. Diffusely increased echogenicity of the liver, which is most commonly secondary to hepatic steatosis. Electronically Signed   By: Feliberto Harts M.D.   On: 08/24/2023 13:38    Scheduled Meds:  amLODipine  10 mg Oral Daily   ascorbic acid  1,000 mg Oral Daily   aspirin EC  81 mg Oral Q  M,W,F   Chlorhexidine Gluconate Cloth  6 each Topical Daily   influenza vaccine adjuvanted  0.5 mL Intramuscular Tomorrow-1000   multivitamin with minerals  1 tablet Oral Daily   mouth rinse  15 mL Mouth Rinse 4 times per day   pneumococcal 20-valent conjugate vaccine  0.5 mL Intramuscular Tomorrow-1000   senna  2 tablet Oral Daily   simvastatin  20 mg Oral QPM   tamsulosin  0.4 mg Oral Daily   trimethoprim  50 mg Oral Daily   urea  30 g Oral BID   Continuous Infusions:   LOS: 6 days   Marguerita Merles, DO [facility] Available via Epic secure chat 7am-7pm After these hours, please refer to coverage provider listed on [email or web address] 08/24/2023, 2:21 PM

## 2023-08-24 NOTE — Plan of Care (Signed)

## 2023-08-25 ENCOUNTER — Inpatient Hospital Stay (HOSPITAL_COMMUNITY): Payer: Medicare Other

## 2023-08-25 DIAGNOSIS — E785 Hyperlipidemia, unspecified: Secondary | ICD-10-CM | POA: Diagnosis not present

## 2023-08-25 DIAGNOSIS — N1832 Chronic kidney disease, stage 3b: Secondary | ICD-10-CM | POA: Diagnosis not present

## 2023-08-25 DIAGNOSIS — I10 Essential (primary) hypertension: Secondary | ICD-10-CM | POA: Diagnosis not present

## 2023-08-25 DIAGNOSIS — E871 Hypo-osmolality and hyponatremia: Secondary | ICD-10-CM | POA: Diagnosis not present

## 2023-08-25 LAB — CBC WITH DIFFERENTIAL/PLATELET
Abs Immature Granulocytes: 0.06 10*3/uL (ref 0.00–0.07)
Basophils Absolute: 0 10*3/uL (ref 0.0–0.1)
Basophils Relative: 0 %
Eosinophils Absolute: 0.2 10*3/uL (ref 0.0–0.5)
Eosinophils Relative: 2 %
HCT: 20.5 % — ABNORMAL LOW (ref 36.0–46.0)
Hemoglobin: 6.9 g/dL — CL (ref 12.0–15.0)
Immature Granulocytes: 1 %
Lymphocytes Relative: 19 %
Lymphs Abs: 1.7 10*3/uL (ref 0.7–4.0)
MCH: 29.4 pg (ref 26.0–34.0)
MCHC: 33.7 g/dL (ref 30.0–36.0)
MCV: 87.2 fL (ref 80.0–100.0)
Monocytes Absolute: 0.8 10*3/uL (ref 0.1–1.0)
Monocytes Relative: 9 %
Neutro Abs: 6.3 10*3/uL (ref 1.7–7.7)
Neutrophils Relative %: 69 %
Platelets: 241 10*3/uL (ref 150–400)
RBC: 2.35 MIL/uL — ABNORMAL LOW (ref 3.87–5.11)
RDW: 14.6 % (ref 11.5–15.5)
WBC: 9.1 10*3/uL (ref 4.0–10.5)
nRBC: 0 % (ref 0.0–0.2)

## 2023-08-25 LAB — COMPREHENSIVE METABOLIC PANEL
ALT: 377 U/L — ABNORMAL HIGH (ref 0–44)
AST: 317 U/L — ABNORMAL HIGH (ref 15–41)
Albumin: 2.5 g/dL — ABNORMAL LOW (ref 3.5–5.0)
Alkaline Phosphatase: 225 U/L — ABNORMAL HIGH (ref 38–126)
Anion gap: 8 (ref 5–15)
BUN: 141 mg/dL — ABNORMAL HIGH (ref 8–23)
CO2: 24 mmol/L (ref 22–32)
Calcium: 9.7 mg/dL (ref 8.9–10.3)
Chloride: 97 mmol/L — ABNORMAL LOW (ref 98–111)
Creatinine, Ser: 1.76 mg/dL — ABNORMAL HIGH (ref 0.44–1.00)
GFR, Estimated: 27 mL/min — ABNORMAL LOW (ref >60)
Glucose, Bld: 118 mg/dL — ABNORMAL HIGH (ref 70–99)
Potassium: 4.7 mmol/L (ref 3.5–5.1)
Sodium: 129 mmol/L — ABNORMAL LOW (ref 135–145)
Total Bilirubin: 0.4 mg/dL (ref 0.3–1.2)
Total Protein: 6.1 g/dL — ABNORMAL LOW (ref 6.5–8.1)

## 2023-08-25 LAB — OCCULT BLOOD X 1 CARD TO LAB, STOOL: Fecal Occult Bld: NEGATIVE

## 2023-08-25 LAB — ABO/RH: ABO/RH(D): O POS

## 2023-08-25 LAB — HEMOGLOBIN AND HEMATOCRIT, BLOOD
HCT: 24.8 % — ABNORMAL LOW (ref 36.0–46.0)
Hemoglobin: 8 g/dL — ABNORMAL LOW (ref 12.0–15.0)

## 2023-08-25 LAB — GLUCOSE, CAPILLARY: Glucose-Capillary: 124 mg/dL — ABNORMAL HIGH (ref 70–99)

## 2023-08-25 LAB — PREPARE RBC (CROSSMATCH)

## 2023-08-25 LAB — PHOSPHORUS: Phosphorus: 3.7 mg/dL (ref 2.5–4.6)

## 2023-08-25 LAB — MAGNESIUM: Magnesium: 2.5 mg/dL — ABNORMAL HIGH (ref 1.7–2.4)

## 2023-08-25 MED ORDER — BISACODYL 10 MG RE SUPP
10.0000 mg | Freq: Every day | RECTAL | Status: DC | PRN
  Administered 2023-08-25: 10 mg via RECTAL
  Filled 2023-08-25 (×2): qty 1

## 2023-08-25 MED ORDER — SODIUM CHLORIDE 0.9% IV SOLUTION: Freq: Once | INTRAVENOUS | Status: AC

## 2023-08-25 MED ORDER — SODIUM CHLORIDE 1 G PO TABS
1.0000 g | ORAL_TABLET | Freq: Three times a day (TID) | ORAL | Status: DC
  Administered 2023-08-25 – 2023-08-26 (×2): 1 g via ORAL
  Filled 2023-08-25 (×2): qty 1

## 2023-08-25 MED ORDER — TECHNETIUM TC 99M MEBROFENIN IV KIT
5.0000 | PACK | Freq: Once | INTRAVENOUS | Status: AC | PRN
  Administered 2023-08-25: 5.1 via INTRAVENOUS

## 2023-08-25 MED ORDER — FUROSEMIDE 20 MG PO TABS
20.0000 mg | ORAL_TABLET | Freq: Once | ORAL | Status: AC
  Administered 2023-08-25: 20 mg via ORAL
  Filled 2023-08-25: qty 1

## 2023-08-25 NOTE — Progress Notes (Signed)
Physical Therapy Treatment Patient Details Name: Joanne Patterson MRN: [id number] DOB: 12/04/1927 Today's Date: 08/25/2023   History of Present Illness 89 y.o. female with medical history significant of type 2 diabetes currently off medications, essential hypertension on amlodipine and olmesartan hydrochlorothiazide, hyperlipidemia who lives at home with her daughter has been following up for recurrent UTI and bladder spasms, she was found to have sodium level of 120 on follow-up blood test at nephrology office and sent to the emergency room. Rolled ankle at nephrology office and sustained a R ankle sprain.    PT Comments  Pt tolerated significant increase in ambulation distance of 57' with RW, no loss of balance, distance limited by fatigue. Pt wore R ankle lace up brace and seemed to tolerate ambulation better with it. Pt's daughter present for session and reports they've ordered temporary WC ramp for home. Plan is for family to care for pt at home.     If plan is discharge home, recommend the following: A lot of help with walking and/or transfers;A lot of help with bathing/dressing/bathroom;Direct supervision/assist for medications management;Assist for transportation;Assistance with cooking/housework;Help with stairs or ramp for entrance   Can travel by private vehicle     Yes  Equipment Recommendations  Wheelchair (measurements PT);Wheelchair cushion (measurements PT)    Recommendations for Other Services       Precautions / Restrictions Precautions Precautions: Fall Precaution Comments: R ankle sprain; per family knees "give out" Required Braces or Orthoses: Other Brace Other Brace: R lace up ankle brace (ordered 10/16) Restrictions Weight Bearing Restrictions: No     Mobility  Bed Mobility Overal bed mobility: Needs Assistance Bed Mobility: Supine to Sit     Supine to sit: Min assist, HOB elevated, Used rails     General bed mobility comments: assist to raise trunk     Transfers Overall transfer level: Needs assistance Equipment used: Rolling walker (2 wheels) Transfers: Sit to/from Stand Sit to Stand: Mod assist           General transfer comment: VCs for hand placement, assist to power up    Ambulation/Gait Ambulation/Gait assistance: Min assist Gait Distance (Feet): 75 Feet Assistive device: Rolling walker (2 wheels) Gait Pattern/deviations: Step-to pattern, Decreased step length - right, Wide base of support, Trunk flexed Gait velocity: decr     General Gait Details: ongoing multi-modal cues for positioning in RW, to incr step length; steady, no loss of balance, distance limited by fatigue   Stairs             Wheelchair Mobility     Tilt Bed    Modified Rankin (Stroke Patients Only)       Balance Overall balance assessment: History of Falls, Needs assistance Sitting-balance support: Feet supported Sitting balance-Leahy Scale: Fair     Standing balance support: During functional activity, Reliant on assistive device for balance Standing balance-Leahy Scale: Poor                              Cognition Arousal: Alert Behavior During Therapy: WFL for tasks assessed/performed Overall Cognitive Status: History of cognitive impairments - at baseline Area of Impairment: Following commands, Memory, Attention, Problem solving                     Memory: Decreased short-term memory Following Commands: Follows one step commands with increased time (requires ongoing repetition of verbal cues)     Problem Solving: Difficulty sequencing,  Decreased initiation, Requires verbal cues, Requires tactile cues General Comments: family providing cues and pt more responsive to direction from dtr        Exercises      General Comments        Pertinent Vitals/Pain Pain Assessment Pain Assessment: No/denies pain Faces Pain Scale: No hurt    Home Living                          Prior  Function            PT Goals (current goals can now be found in the care plan section) Acute Rehab PT Goals PT Goal Formulation: With patient/family Time For Goal Achievement: 08/31/23 Potential to Achieve Goals: Good Progress towards PT goals: Progressing toward goals    Frequency    Min 1X/week      PT Plan      Co-evaluation              AM-PAC PT "6 Clicks" Mobility   Outcome Measure  Help needed turning from your back to your side while in a flat bed without using bedrails?: A Little Help needed moving from lying on your back to sitting on the side of a flat bed without using bedrails?: A Little Help needed moving to and from a bed to a chair (including a wheelchair)?: A Little Help needed standing up from a chair using your arms (e.g., wheelchair or bedside chair)?: A Lot Help needed to walk in hospital room?: A Little Help needed climbing 3-5 steps with a railing? : Total 6 Click Score: 15    End of Session Equipment Utilized During Treatment: Gait belt Activity Tolerance: Patient tolerated treatment well Patient left: with call bell/phone within reach;in chair;with family/visitor present Nurse Communication: Mobility status PT Visit Diagnosis: Other abnormalities of gait and mobility (R26.89);Difficulty in walking, not elsewhere classified (R26.2)     Time: 1333-1401 PT Time Calculation (min) (ACUTE ONLY): 28 min  Charges:    $Gait Training: 8-22 mins $Therapeutic Activity: 8-22 mins PT General Charges $$ ACUTE PT VISIT: 1 Visit                     Tamala Ser PT 08/25/2023  Acute Rehabilitation Services  Office [phone]

## 2023-08-25 NOTE — Progress Notes (Signed)
PHARMACY - PHYSICIAN COMMUNICATION CRITICAL VALUE ALERT - BLOOD CULTURE IDENTIFICATION (BCID)  Joanne Patterson is an 89 y.o. female who presented to [facility] on 08/16/2023 with a chief complaint of hypo  Assessment:  1/3 BCx bottles growing unidentified GPR (generally contaminant)  Name of physician (or Provider) Contacted: Sheikh  Current antibiotics: chronic trimethoprim for recurrent UTI  Changes to prescribed antibiotics recommended: hold off on additional antibiotics at this time; if clinical status changes and patient appears septic, can consider Ampicillin for Listeria coverage Recommendations accepted by provider  No results found for this or any previous visit.  [name] A 08/25/2023  3:53 PM

## 2023-08-25 NOTE — Plan of Care (Signed)
CH[facility] Tonsillectomy/Adenoidectomy, Postoperative PEDS care plan entered in error.

## 2023-08-25 NOTE — Progress Notes (Signed)
Pt had two IV s infiltrate while getting her unit of blood. Pharmacy called for infiltrations. Pt unable to receive full bag within four hour time frame due to this. Received approximately 2/3rds of bag. MD notified. Will obtain H&H post transfusion.

## 2023-08-25 NOTE — Progress Notes (Signed)
PROGRESS NOTE    SHACARRA FOAT  ZOX:[id number] DOB: Sep 17, 1928 DOA: 08/16/2023 PCP: Jarrett Soho, PA-C   Brief Narrative:  The patient is a 89 year old elderly African-American female with a past medical history significant for but not limited to diabetes mellitus type 2 is diet controlled, hypertension, hyperlipidemia, history of recurrent UTI and bladder spasms who is seen at the neurology office and was found to have a sodium 120.  She is asked to come back to the hospital for further assessment and management.  Nephrology consulted.  She is being evaluated for symptomatic hyponatremia and sodium is now slowly improving.  Nephrology is now recommending stopping hydrochlorothiazide and starting urea 30 g twice daily.  Since BUN is elevated out of proportion to her creatinine so we will need to rule out upper GI bleeding and obtain FOBT but this is likely in the setting of her urea supplements and now the urea supplements have been stopped and FOBT is negative but given 1 unit PRBCs given her blood count being low.  LFTs were abnormal so we will obtain a right upper quadrant ultrasound and acute hepatitis panel and acute hepatitis panel was negative and right upper quadrant ultrasound done and showed concerning findings and recommendation was to get a HIDA scan the HIDA scan was negative.  She also spiked a temperature 100.6 this a.m. so we will obtain blood cultures x 2 and continue to monitor.  LFTs are slowly improving and nephrology is now recommending stopping the urea as above given her significant elevated BUN.  Will need to monitor carefully.  Assessment and Plan:  Symptomatic Hyponatremia -Presented with sodium of 120, trended down to 119 -Nephrology was consulted, multifactorial, medication effect from ARB and HCTZ, CKD stage IIIb and initial concern was for SIADH.  Euvolemic -Cortisol level normal, urine osmolality 319, urine sodium 62, serum osmolarity 278 -Patient received  tolvaptan 15 mg but did not respond, subsequently received 0.9% normal saline and improved sodium.  Recommended to avoid HCTZ/ARB. -Repeat isotonic fluid hydration effusion did not cause the sodium to respond and Urine Osm was up to 497 so now nephrology was recommending Urea 30 g twice daily but this is now stopped given very Elevated BUN -Na+ Trend: Recent Labs  Lab 08/21/23 0500 08/21/23 0502 08/22/23 0450 08/22/23 1506 08/23/23 0453 08/24/23 0459 08/25/23 0501  NA 130* 133* 125* 124* 130* 130* 129*  -Continue to Monitor and Trend and repeat CMP in the AM sodium remains stable -Now nephrology is recommending starting salt tablets, fluid restriction and a dose of Furosemide 20 mg p.o. daily   Hyperkalemia, improved  -K+ was 6.5 Unclear if it was hemolysis, repeat lab checked in 2 minutes, not sure if it was a new sample -Treated with Lokelma, insulin D50, albuterol neb -K+ Trend: Recent Labs  Lab 08/21/23 1858 08/21/23 2303 08/22/23 0450 08/22/23 1506 08/23/23 0453 08/24/23 0459 08/25/23 0501  K 4.9 4.9 4.8 4.5 4.8 4.8 4.7  -Continue to Monitor and Trend and repeat CMP in the AM    Essential Hypertension -BP currently stable, currently on Amlodipine 10 mg po daily -C/w IV Hydralazine 10 mg q4hprn HBP for SBP >180 or DBP >110 -Continue to Monitor BP per Protocol -Last BP reading was 134/44  Acute Urinary Retention -Strict I's and O's and Daily Weights and Continue Bladder Scanning -C/w Tamsulosin 0.4 mg po Daily x 7 Days    Hyperlipidemia -Continue Simvastatin 20 mg po Daily    CKD (Chronic Kidney Disease), Stage 3b Metabolic  Acidosis  Elevated BUN -Has a baseline creatinine of 1.4-1.7 and follows with Dr. Valentino Nose in the clinic -BUN/Cr Trend: Recent Labs  Lab 08/21/23 0500 08/21/23 0502 08/22/23 0450 08/22/23 1506 08/23/23 0453 08/24/23 0459 08/25/23 0501  BUN 23 23 25* 24* 57* 104* 141*  CREATININE 1.38* 1.40* 1.38* 1.58* 1.63* 1.54* 1.76*  -Avoid  Nephrotoxic Medications, Contrast Dyes, Hypotension and Dehydration to Ensure Adequate Renal Perfusion and will need to Renally Adjust Meds -BUN/Cr is out of proportion compared to creatinine and this is likely related to her Urea supplements but given that her hemoglobin slightly dropped we will need to rule out upper GI bleeding -Nephrology now recommending also holding the trimethoprim -Continue to Monitor and Trend Renal Function carefully and repeat CMP in the AM    Chronic Anemia, known history of iron deficiency -Per daughter, patient follows [facility], has known history of iron deficiency and takes iron supplement 325 mg daily -Hgb/Hct Trend: Recent Labs  Lab 08/16/23 1220 08/20/23 0507 08/22/23 0443 08/23/23 0453 08/24/23 0459 08/25/23 0501  HGB 10.1* 7.6* 7.4* 7.6* 7.0* 6.9*  HCT 29.3* 23.6* 22.1* 23.5* 20.7* 20.5*  MCV 85.7 88.7 88.0 90.0 87.7 87.2  -Anemia panel done and showed an iron level of 19, UIBC of 233, TIBC 252, saturation ratios of 8%, ferritin level of 136, folate level 30.8, vitamin B12 1001 -FOBT is NEGATIVE -Hold Lovenox for now -Will need to R/o Upper GIB given worsened BUN and Anemia -She received IV iron infusion this hospitalization and will get 1 unit of pRBC -Continue to monitor for signs and symptoms of bleeding; no overt bleeding noted -Nephrology -Will need continued iron supplementation at discharge -Repeat CBC in the AM and if hemoglobin continues to remain low may need GI involvement  Fever -Low Grade at 100.6 yesterday morning  -? Related to all the blankets patient had but will check Blood Cx x2 and RUQ U/S -Recent urinalysis done showed large hemoglobin, trace leukocytes, negative nitrites, rare bacteria, 21-50 RBCs per high-power field, 0-5 squamous epithelial cells and 0-5 WBCs -Consider repeating if she continues to have fevers but she has not had a fever today. -Blood cultures x 2 obtained and 1 out of the 3 showed  gram-positive rods which is likely contaminant -Chest x-ray done and showed "No active disease. Aortic Atherosclerosis" -Check PCT and Lactic Acidosis in the AM -Continue to Monitor For S/Sx of Infection  Abnormal LFTs, slowly improving  -Unclear and possibly Reactive? -LFT Trend: Recent Labs  Lab 08/16/23 1220 08/24/23 0459 08/25/23 0501  AST 40 421* 317*  ALT 39 444* 377*  BILITOT 1.0 0.7 0.4  ALKPHOS 80 214* 225*  -Check RUQ U/S and showed "Gallbladder wall thickening without a reported Murphy sign or gallstones. Findings are indeterminate but could reflect cholecystitis in the correct clinical setting. A HIDA scan could further evaluate clinically warranted.. Diffusely increased echogenicity of the liver, which is most commonly secondary to hepatic steatosis." -Acute Hepatitis Panel which is NEGATIVE -Continue to Monitor and Trend Hepatic Fxn Panel and repeat in the AM -HIDA done and showed "Patent cystic and common bile ducts. Normal gallbladder ejection fraction."  Right Arm Swelling -? In the Setting of IV Infiltration -Continue Elevate and Warm Compresses    Right Ankle Sprain -Per Family she twisted her Ankle PTA -X-ray done and showed "Asymmetric moderate soft tissue swelling overlying the lateral malleolus without discrete underlying fracture, concerning for soft tissue/ligamentous injury." -Mobilize with PT OT and they are recommending Home Health PT  Constipation -C/w Bowel Regimen and add Bisacodyl Suppository since she still has not had a bowel movement  Hypoalbuminemia -Patient's Albumin Trend: Recent Labs  Lab 08/16/23 1220 08/20/23 0507 08/21/23 0502 08/24/23 0459 08/25/23 0501  ALBUMIN 3.7 2.7* 2.8* 2.3* 2.5*  -Continue to Monitor and Trend and repeat CMP in the AM  Obesity -Complicates overall prognosis and care -Estimated body mass index is 29.64 kg/m as calculated from the following:   Height as of this encounter: 5\' 5"  (1.651 m).   Weight as  of this encounter: 80.8 kg.  -Weight Loss and Dietary Counseling given   DVT prophylaxis: Place and maintain sequential compression device Start: 08/22/23 1247    Code Status: Full Code Family Communication: Discussed with Daughter at bedside  Disposition Plan:  Level of care: Progressive Status is: Inpatient Remains inpatient appropriate because: Needs further clinical improvement an clearance by Nephrology   Consultants:  Nephrology   Procedures:  As delineated as above  Antimicrobials:  Anti-infectives (From admission, onward)    Start     Dose/Rate Route Frequency Ordered Stop   08/18/23 1830  trimethoprim (TRIMPEX) tablet 50 mg  Status:  Discontinued        50 mg Oral Daily 08/18/23 1731 08/25/23 1556   08/16/23 1615  cephALEXin (KEFLEX) capsule 500 mg        500 mg Oral Every 8 hours 08/16/23 1606 08/17/23 1339       Subjective: Seen and examined at bedside and sitting in the chair bedside and had no complaints. No Nausea or vomiting. Denies any complaints. Getting a unit of blood.   Objective: Vitals:   08/25/23 0604 08/25/23 1110 08/25/23 1148 08/25/23 1528  BP: (!) 130/42 (!) 147/45 (!) 125/41 (!) 143/47  Pulse: 81 87 87 85  Resp: 18 18 18 20   Temp: 98.3 F (36.8 C) (!) 97.3 F (36.3 C) (!) 97.5 F (36.4 C) (!) 97.5 F (36.4 C)  TempSrc: Oral Oral Oral Oral  SpO2: 98% 100% 98% 100%  Weight:      Height:        Intake/Output Summary (Last 24 hours) at 08/25/2023 1803 Last data filed at 08/25/2023 1542 Gross per 24 hour  Intake 660.17 ml  Output 3025 ml  Net -2364.83 ml   Filed Weights   08/23/23 0530 08/24/23 0500 08/25/23 0500  Weight: 87.8 kg 88.4 kg 80.8 kg   Examination: Physical Exam:  Constitutional: WN/WD obese elderly AAF in NAD Respiratory: Diminished to auscultation bilaterally, no wheezing, rales, rhonchi or crackles. Normal respiratory effort and patient is not tachypenic. No accessory muscle use. Unlabored breathing   Cardiovascular: RRR, no murmurs / rubs / gallops. S1 and S2 auscultated. Minimal extremity edema. Abdomen: Soft, non-tender, Distended 2/2 body habitus Bowel sounds positive.  GU: Deferred. Musculoskeletal: No clubbing / cyanosis of digits/nails. No joint deformity upper and lower extremities and has an ASO on right ankle. Skin: No rashes, lesions, ulcers on a limited skin evaluation. No induration; Warm and dry.  Neurologic: CN 2-12 grossly intact with no focal deficits. Romberg sign cerebellar reflexes not assessed.   Data Reviewed: I have personally reviewed following labs and imaging studies  CBC: Recent Labs  Lab 08/20/23 0507 08/22/23 0443 08/23/23 0453 08/24/23 0459 08/25/23 0501  WBC 10.0 8.0 8.5 8.5 9.1  NEUTROABS 6.6  --   --  6.0 6.3  HGB 7.6* 7.4* 7.6* 7.0* 6.9*  HCT 23.6* 22.1* 23.5* 20.7* 20.5*  MCV 88.7 88.0 90.0 87.7 87.2  PLT 189  212 218 218 241   Basic Metabolic Panel: Recent Labs  Lab 08/20/23 0507 08/21/23 0500 08/21/23 0502 08/21/23 1239 08/22/23 0450 08/22/23 1506 08/23/23 0453 08/24/23 0459 08/25/23 0501  NA 127*  126*   < > 133*  --  125* 124* 130* 130* 129*  K 4.8  4.7   < > 6.5*   < > 4.8 4.5 4.8 4.8 4.7  CL 98  98   < > 105  --  96* 97* 98 98 97*  CO2 22  20*   < > 19*  --  22 18* 21* 23 24  GLUCOSE 86  86   < > 97  --  86 135* 92 104* 118*  BUN 22  22   < > 23  --  25* 24* 57* 104* 141*  CREATININE 1.36*  1.38*   < > 1.40*  --  1.38* 1.58* 1.63* 1.54* 1.76*  CALCIUM 8.8*  8.7*   < > 9.4  --  9.0 8.9 9.5 9.4 9.7  MG 2.1  --   --   --   --   --   --  2.3 2.5*  PHOS 3.1  --  3.3  --   --   --   --  3.9 3.7   < > = values in this interval not displayed.   GFR: Estimated Creatinine Clearance: 21 mL/min (A) (by C-G formula based on SCr of 1.76 mg/dL (H)). Liver Function Tests: Recent Labs  Lab 08/20/23 0507 08/21/23 0502 08/24/23 0459 08/25/23 0501  AST  --   --  421* 317*  ALT  --   --  444* 377*  ALKPHOS  --   --  214* 225*   BILITOT  --   --  0.7 0.4  PROT  --   --  5.6* 6.1*  ALBUMIN 2.7* 2.8* 2.3* 2.5*   No results for input(s): "LIPASE", "AMYLASE" in the last 168 hours. No results for input(s): "AMMONIA" in the last 168 hours. Coagulation Profile: No results for input(s): "INR", "PROTIME" in the last 168 hours. Cardiac Enzymes: Recent Labs  Lab 08/24/23 0459  CKTOTAL 405*   BNP (last 3 results) No results for input(s): "PROBNP" in the last 8760 hours. HbA1C: No results for input(s): "HGBA1C" in the last 72 hours. CBG: Recent Labs  Lab 08/23/23 0752 08/24/23 0741 08/24/23 1131 08/24/23 1544 08/25/23 0751  GLUCAP 88 91 174* 186* 124*   Lipid Profile: No results for input(s): "CHOL", "HDL", "LDLCALC", "TRIG", "CHOLHDL", "LDLDIRECT" in the last 72 hours. Thyroid Function Tests: No results for input(s): "TSH", "T4TOTAL", "FREET4", "T3FREE", "THYROIDAB" in the last 72 hours. Anemia Panel: No results for input(s): "VITAMINB12", "FOLATE", "FERRITIN", "TIBC", "IRON", "RETICCTPCT" in the last 72 hours. Sepsis Labs: No results for input(s): "PROCALCITON", "LATICACIDVEN" in the last 168 hours.  Recent Results (from the past 240 hour(s))  Culture, blood (Routine X 2) w Reflex to ID Panel     Status: None (Preliminary result)   Collection Time: 08/24/23  8:57 AM   Specimen: BLOOD RIGHT ARM  Result Value Ref Range Status   Specimen Description   Final    BLOOD RIGHT ARM Performed at [facility], [address], [place], [place] [address]    Special Requests   Final    BOTTLES DRAWN AEROBIC AND ANAEROBIC Blood Culture adequate volume Performed at [facility], [address], [place], [place] [address]    Culture  Setup Time   Final  GRAM POSITIVE RODS AEROBIC BOTTLE ONLY CRITICAL RESULT CALLED TO, READ BACK BY AND VERIFIED WITH: PHARMD DREW WOFFORD ON 08/25/23 @ 1539 BY DRT Performed at [facility], [address] [place], [place] [address]    Culture GRAM  POSITIVE RODS  Final   Report Status PENDING  Incomplete  Culture, blood (Routine X 2) w Reflex to ID Panel     Status: None (Preliminary result)   Collection Time: 08/24/23  8:57 AM   Specimen: BLOOD RIGHT HAND  Result Value Ref Range Status   Specimen Description   Final    BLOOD RIGHT HAND Performed at [facility], [address], [place], [place] [address]    Special Requests   Final    BOTTLES DRAWN AEROBIC ONLY Blood Culture results may not be optimal due to an inadequate volume of blood received in culture bottles Performed at [facility], [address], [place], [place] [address]    Culture   Final    NO GROWTH < 24 HOURS Performed at [facility], [address], [place], [place] [address]    Report Status PENDING  Incomplete     Radiology Studies: NM Hepato W/EF  Result Date: 08/25/2023 CLINICAL DATA:  Cholelithiasis, abnormal LFTs. EXAM: NUCLEAR MEDICINE HEPATOBILIARY IMAGING WITH GALLBLADDER EF TECHNIQUE: Sequential images of the abdomen were obtained out to 60 minutes following intravenous administration of radiopharmaceutical. After oral ingestion of Ensure, gallbladder ejection fraction was determined. At 60 min, normal ejection fraction is greater than 33%. RADIOPHARMACEUTICALS:  5.1 mCi Tc-67m  Choletec IV COMPARISON:  Ultrasound August 25, 2023 FINDINGS: Prompt uptake and biliary excretion of activity by the liver is seen. Gallbladder activity is visualized, consistent with patency of cystic duct. Biliary activity passes into small bowel, consistent with patent common bile duct. Calculated gallbladder ejection fraction is 59%. (Normal gallbladder ejection fraction with Ensure is greater than 33% and less than 80%.) IMPRESSION: 1.  Patent cystic and common bile ducts. 2.  Normal gallbladder ejection fraction. Electronically Signed   By: Maudry Mayhew M.D.   On: 08/25/2023 12:07   US Abdomen Limited RUQ (LIVER/GB)  Result Date:  08/24/2023 CLINICAL DATA:  [id number] Abnormal LFTs [id number] EXAM: ULTRASOUND ABDOMEN LIMITED RIGHT UPPER QUADRANT COMPARISON:  None Available. FINDINGS: Gallbladder: Gallbladder wall thickening, measuring up to 6 mm. No sonographic Murphy sign noted by sonographer. No gallstones identified. Common bile duct: Diameter: 2.7 mm within normal limits. Liver: No focal lesion identified. Increased echogenicity diffusely. Portal vein is patent on color Doppler imaging with normal direction of blood flow towards the liver. IMPRESSION: 1. Gallbladder wall thickening without a reported Murphy sign or gallstones. Findings are indeterminate but could reflect cholecystitis in the correct clinical setting. A HIDA scan could further evaluate clinically warranted. 2. Diffusely increased echogenicity of the liver, which is most commonly secondary to hepatic steatosis. Electronically Signed   By: Feliberto Harts M.D.   On: 08/24/2023 13:38    Scheduled Meds:  amLODipine  10 mg Oral Daily   ascorbic acid  1,000 mg Oral Daily   aspirin EC  81 mg Oral Q M,W,F   Chlorhexidine Gluconate Cloth  6 each Topical Daily   influenza vaccine adjuvanted  0.5 mL Intramuscular Tomorrow-1000   multivitamin with minerals  1 tablet Oral Daily   mouth rinse  15 mL Mouth Rinse 4 times per day   pneumococcal 20-valent conjugate vaccine  0.5 mL Intramuscular Tomorrow-1000   senna  2 tablet  Oral Daily   sodium chloride  1 g Oral TID WC   tamsulosin  0.4 mg Oral Daily   Continuous Infusions:   LOS: 7 days   Marguerita Merles, DO [facility] Available via Epic secure chat 7am-7pm After these hours, please refer to coverage provider listed on [email or web address] 08/25/2023, 6:03 PM

## 2023-08-25 NOTE — Progress Notes (Addendum)
[name]  Assessment/ Plan: Pt is a 89 y.o. yo female with medical history significant for hypertension, DM, HLD, consulted for hyponatremia.  # Hyponatremia: TSH and cortisol normal.  HCTZ was stopped.  Initially appears concern for SIADH and received tolvaptan 1 dose without much improvement.  Also received IV fluid but downtrending sodium level.  She was placed on urea salt tablet as well. Serum sodium level is 129 today.  Given very high BUN level, I am going to stop urea tablet.  I will go ahead and start salt tablet, fluid restriction and a dose of furosemide 20 mg today. By looking urine test, this is concerning for possible SIADH.  We will monitor lab.  # Azotemia/very high BUN: Need to rule out GI bleed.  Presumably due to CKD and recent start of urea supplement.  Fortunately, no sign or symptoms of uremia.  Stopping urea tablet as discussed above.  # CKD 3B: Baseline creatinine level around 1.4-1.7 and follows with Dr. Melanee Spry.  There was some concern about acute urinary retention on Foley catheter.  Strict ins and out and monitor lab.  Also recommend to hold trimethoprim which will impair tubular excretion of creatinine reflecting elevated serum creatinine level although true GFR is unchanged.  # Anemia, need to rule out GI: Blood transfusion today however received only 2 thought of the blood because of the IVC per nursing staff.  Also received IV iron during this hospitalization.  I will order a dose of Aranesp.  # HTN/volume: Blood pressure acceptable.  On amlodipine.  I have discussed with the patient's daughter and granddaughter this morning and with her daughter this afternoon at the bedside.  The patient was in procedure when I came this morning.  D/w RN and primary team Dr. Marland Mcalpine  Subjective: Seen and examined at bedside.  Denies nausea, vomiting, chest pain, shortness of breath.  Urine output is around 1.8 L.  Patient's daughter was  presented with her.  Discussed with RN as well. Objective Vital signs in last 24 hours: Vitals:   08/25/23 0604 08/25/23 1110 08/25/23 1148 08/25/23 1528  BP: (!) 130/42 (!) 147/45 (!) 125/41 (!) 143/47  Pulse: 81 87 87 85  Resp: 18 18 18 20   Temp: 98.3 F (36.8 C) (!) 97.3 F (36.3 C) (!) 97.5 F (36.4 C) (!) 97.5 F (36.4 C)  TempSrc: Oral Oral Oral Oral  SpO2: 98% 100% 98% 100%  Weight:      Height:       Weight change: -7.6 kg  Intake/Output Summary (Last 24 hours) at 08/25/2023 1637 Last data filed at 08/25/2023 1542 Gross per 24 hour  Intake 710.17 ml  Output 3025 ml  Net -2314.83 ml       Labs: RENAL PANEL Recent Labs  Lab 08/20/23 0507 08/21/23 0500 08/21/23 0502 08/21/23 1239 08/22/23 0450 08/22/23 1506 08/23/23 0453 08/24/23 0459 08/25/23 0501  NA 127*  126*   < > 133*  --  125* 124* 130* 130* 129*  K 4.8  4.7   < > 6.5*   < > 4.8 4.5 4.8 4.8 4.7  CL 98  98   < > 105  --  96* 97* 98 98 97*  CO2 22  20*   < > 19*  --  22 18* 21* 23 24  GLUCOSE 86  86   < > 97  --  86 135* 92 104* 118*  BUN 22  22   < > 23  --  25* 24* 57* 104* 141*  CREATININE 1.36*  1.38*   < > 1.40*  --  1.38* 1.58* 1.63* 1.54* 1.76*  CALCIUM 8.8*  8.7*   < > 9.4  --  9.0 8.9 9.5 9.4 9.7  MG 2.1  --   --   --   --   --   --  2.3 2.5*  PHOS 3.1  --  3.3  --   --   --   --  3.9 3.7  ALBUMIN 2.7*  --  2.8*  --   --   --   --  2.3* 2.5*   < > = values in this interval not displayed.    Liver Function Tests: Recent Labs  Lab 08/21/23 0502 08/24/23 0459 08/25/23 0501  AST  --  421* 317*  ALT  --  444* 377*  ALKPHOS  --  214* 225*  BILITOT  --  0.7 0.4  PROT  --  5.6* 6.1*  ALBUMIN 2.8* 2.3* 2.5*   No results for input(s): "LIPASE", "AMYLASE" in the last 168 hours. No results for input(s): "AMMONIA" in the last 168 hours. CBC: Recent Labs    08/20/23 0507 08/22/23 0443 08/22/23 1506 08/23/23 0453 08/24/23 0459 08/25/23 0501  HGB 7.6* 7.4*  --  7.6* 7.0*  6.9*  MCV 88.7 88.0  --  90.0 87.7 87.2  VITAMINB12  --   --  1,001*  --   --   --   FOLATE  --   --  30.8  --   --   --   FERRITIN  --   --  136  --   --   --   TIBC  --   --  252  --   --   --   IRON  --   --  19*  --   --   --   RETICCTPCT  --   --  1.5  --   --   --     Cardiac Enzymes: Recent Labs  Lab 08/24/23 0459  CKTOTAL 405*   CBG: Recent Labs  Lab 08/23/23 0752 08/24/23 0741 08/24/23 1131 08/24/23 1544 08/25/23 0751  GLUCAP 88 91 174* 186* 124*    Iron Studies: No results for input(s): "IRON", "TIBC", "TRANSFERRIN", "FERRITIN" in the last 72 hours. Studies/Results: NM Hepato W/EF  Result Date: 08/25/2023 CLINICAL DATA:  Cholelithiasis, abnormal LFTs. EXAM: NUCLEAR MEDICINE HEPATOBILIARY IMAGING WITH GALLBLADDER EF TECHNIQUE: Sequential images of the abdomen were obtained out to 60 minutes following intravenous administration of radiopharmaceutical. After oral ingestion of Ensure, gallbladder ejection fraction was determined. At 60 min, normal ejection fraction is greater than 33%. RADIOPHARMACEUTICALS:  5.1 mCi Tc-12m  Choletec IV COMPARISON:  Ultrasound August 25, 2023 FINDINGS: Prompt uptake and biliary excretion of activity by the liver is seen. Gallbladder activity is visualized, consistent with patency of cystic duct. Biliary activity passes into small bowel, consistent with patent common bile duct. Calculated gallbladder ejection fraction is 59%. (Normal gallbladder ejection fraction with Ensure is greater than 33% and less than 80%.) IMPRESSION: 1.  Patent cystic and common bile ducts. 2.  Normal gallbladder ejection fraction. Electronically Signed   By: Maudry Mayhew M.D.   On: 08/25/2023 12:07   US Abdomen Limited RUQ (LIVER/GB)  Result Date: 08/24/2023 CLINICAL DATA:  [id number] Abnormal LFTs [id number] EXAM: ULTRASOUND ABDOMEN LIMITED RIGHT UPPER QUADRANT COMPARISON:  None Available. FINDINGS: Gallbladder: Gallbladder wall thickening, measuring up to 6 mm. No  sonographic Murphy sign  noted by sonographer. No gallstones identified. Common bile duct: Diameter: 2.7 mm within normal limits. Liver: No focal lesion identified. Increased echogenicity diffusely. Portal vein is patent on color Doppler imaging with normal direction of blood flow towards the liver. IMPRESSION: 1. Gallbladder wall thickening without a reported Murphy sign or gallstones. Findings are indeterminate but could reflect cholecystitis in the correct clinical setting. A HIDA scan could further evaluate clinically warranted. 2. Diffusely increased echogenicity of the liver, which is most commonly secondary to hepatic steatosis. Electronically Signed   By: Feliberto Harts M.D.   On: 08/24/2023 13:38    Medications: Infusions:   Scheduled Medications:  amLODipine  10 mg Oral Daily   ascorbic acid  1,000 mg Oral Daily   aspirin EC  81 mg Oral Q M,W,F   Chlorhexidine Gluconate Cloth  6 each Topical Daily   influenza vaccine adjuvanted  0.5 mL Intramuscular Tomorrow-1000   multivitamin with minerals  1 tablet Oral Daily   mouth rinse  15 mL Mouth Rinse 4 times per day   pneumococcal 20-valent conjugate vaccine  0.5 mL Intramuscular Tomorrow-1000   senna  2 tablet Oral Daily   sodium chloride  1 g Oral TID WC   tamsulosin  0.4 mg Oral Daily    have reviewed scheduled and prn medications.  Physical Exam: General:NAD, comfortable Heart:RRR, s1s2 nl Lungs:clear b/l, no crackle Abdomen:soft, Non-tender, non-distended Extremities:No edema Neurology: Alert, awake and following command.  Bradey Luzier Prasad Kanyah Matsushima 08/25/2023,4:37 PM  LOS: 7 days

## 2023-08-26 DIAGNOSIS — D649 Anemia, unspecified: Secondary | ICD-10-CM

## 2023-08-26 DIAGNOSIS — I10 Essential (primary) hypertension: Secondary | ICD-10-CM | POA: Diagnosis not present

## 2023-08-26 DIAGNOSIS — E785 Hyperlipidemia, unspecified: Secondary | ICD-10-CM | POA: Diagnosis not present

## 2023-08-26 DIAGNOSIS — E871 Hypo-osmolality and hyponatremia: Secondary | ICD-10-CM | POA: Diagnosis not present

## 2023-08-26 DIAGNOSIS — N1832 Chronic kidney disease, stage 3b: Secondary | ICD-10-CM | POA: Diagnosis not present

## 2023-08-26 LAB — CBC WITH DIFFERENTIAL/PLATELET
Abs Immature Granulocytes: 0.05 10*3/uL (ref 0.00–0.07)
Basophils Absolute: 0 10*3/uL (ref 0.0–0.1)
Basophils Relative: 0 %
Eosinophils Absolute: 0.2 10*3/uL (ref 0.0–0.5)
Eosinophils Relative: 3 %
HCT: 23 % — ABNORMAL LOW (ref 36.0–46.0)
Hemoglobin: 7.7 g/dL — ABNORMAL LOW (ref 12.0–15.0)
Immature Granulocytes: 1 %
Lymphocytes Relative: 20 %
Lymphs Abs: 1.7 10*3/uL (ref 0.7–4.0)
MCH: 29.1 pg (ref 26.0–34.0)
MCHC: 33.5 g/dL (ref 30.0–36.0)
MCV: 86.8 fL (ref 80.0–100.0)
Monocytes Absolute: 0.7 10*3/uL (ref 0.1–1.0)
Monocytes Relative: 8 %
Neutro Abs: 5.9 10*3/uL (ref 1.7–7.7)
Neutrophils Relative %: 68 %
Platelets: 264 10*3/uL (ref 150–400)
RBC: 2.65 MIL/uL — ABNORMAL LOW (ref 3.87–5.11)
RDW: 14.6 % (ref 11.5–15.5)
WBC: 8.7 10*3/uL (ref 4.0–10.5)
nRBC: 0 % (ref 0.0–0.2)

## 2023-08-26 LAB — COMPREHENSIVE METABOLIC PANEL
ALT: 315 U/L — ABNORMAL HIGH (ref 0–44)
AST: 205 U/L — ABNORMAL HIGH (ref 15–41)
Albumin: 2.7 g/dL — ABNORMAL LOW (ref 3.5–5.0)
Alkaline Phosphatase: 213 U/L — ABNORMAL HIGH (ref 38–126)
Anion gap: 10 (ref 5–15)
BUN: 94 mg/dL — ABNORMAL HIGH (ref 8–23)
CO2: 26 mmol/L (ref 22–32)
Calcium: 10.1 mg/dL (ref 8.9–10.3)
Chloride: 99 mmol/L (ref 98–111)
Creatinine, Ser: 1.49 mg/dL — ABNORMAL HIGH (ref 0.44–1.00)
GFR, Estimated: 33 mL/min — ABNORMAL LOW (ref >60)
Glucose, Bld: 107 mg/dL — ABNORMAL HIGH (ref 70–99)
Potassium: 4.6 mmol/L (ref 3.5–5.1)
Sodium: 135 mmol/L (ref 135–145)
Total Bilirubin: 0.4 mg/dL (ref 0.3–1.2)
Total Protein: 6.1 g/dL — ABNORMAL LOW (ref 6.5–8.1)

## 2023-08-26 LAB — PROCALCITONIN: Procalcitonin: 0.16 ng/mL

## 2023-08-26 LAB — LACTIC ACID, PLASMA: Lactic Acid, Venous: 0.6 mmol/L (ref 0.5–1.9)

## 2023-08-26 LAB — MAGNESIUM: Magnesium: 2.4 mg/dL (ref 1.7–2.4)

## 2023-08-26 LAB — PHOSPHORUS: Phosphorus: 3.3 mg/dL (ref 2.5–4.6)

## 2023-08-26 LAB — GLUCOSE, CAPILLARY: Glucose-Capillary: 90 mg/dL (ref 70–99)

## 2023-08-26 MED ORDER — FUROSEMIDE 20 MG PO TABS
10.0000 mg | ORAL_TABLET | Freq: Every day | ORAL | Status: DC
  Administered 2023-08-27: 10 mg via ORAL
  Filled 2023-08-26: qty 1

## 2023-08-26 MED ORDER — DARBEPOETIN ALFA 60 MCG/0.3ML IJ SOSY
60.0000 ug | PREFILLED_SYRINGE | Freq: Once | INTRAMUSCULAR | Status: AC
  Administered 2023-08-26: 60 ug via SUBCUTANEOUS
  Filled 2023-08-26: qty 0.3

## 2023-08-26 NOTE — Progress Notes (Addendum)
Resumed care from morning RN. Agree with assessment & will continue plan of care Foley catheter removed at this time, per MD during rounds.

## 2023-08-26 NOTE — Progress Notes (Signed)
PROGRESS NOTE    Joanne Patterson  ZHY:[id number] DOB: 13-Jan-1928 DOA: 08/16/2023 PCP: Jarrett Soho, PA-C   Brief Narrative:  The patient is a 89 year old elderly African-American female with a past medical history significant for but not limited to diabetes mellitus type 2 is diet controlled, hypertension, hyperlipidemia, history of recurrent UTI and bladder spasms who is seen at the neurology office and was found to have a sodium 120.  She is asked to come back to the hospital for further assessment and management.  Nephrology consulted.  She is being evaluated for symptomatic hyponatremia and sodium is now slowly improving.  Nephrology is now recommending stopping hydrochlorothiazide and starting urea 30 g twice daily.  Since BUN is elevated out of proportion to her creatinine so we will need to rule out upper GI bleeding and obtain FOBT but this is likely in the setting of her urea supplements and now the urea supplements have been stopped and FOBT is negative but given 1 unit PRBCs given her blood count being low.  LFTs were abnormal so we will obtain a right upper quadrant ultrasound and acute hepatitis panel and acute hepatitis panel was negative and right upper quadrant ultrasound done and showed concerning findings and recommendation was to get a HIDA scan the HIDA scan was negative.  She also spiked a temperature 100.6 this a.m. so we will obtain blood cultures x 2 and continue to monitor.  LFTs are slowly improving and nephrology is now recommending stopping the urea as above given her significant elevated BUN and also initiating po Lasix 10 mg daily.  Will need to monitor carefully and is slowly improving. Will attempt a TOV today   Anticipating D/Cing in the next 24-48 hours  Assessment and Plan:  Symptomatic Hyponatremia -Presented with sodium of 120, trended down to 119 -Nephrology was consulted, multifactorial, medication effect from ARB and HCTZ, CKD stage IIIb and initial  concern was for SIADH.  Euvolemic -Cortisol level normal, urine osmolality 319, urine sodium 62, serum osmolarity 278 -Patient received tolvaptan 15 mg but did not respond, subsequently received 0.9% normal saline and improved sodium.  Recommended to avoid HCTZ/ARB. -Repeat isotonic fluid hydration effusion did not cause the sodium to respond and Urine Osm was up to 497 so now nephrology was recommending Urea 30 g twice daily but this is now stopped given very Elevated BUN -Na+ Trend: Recent Labs  Lab 08/21/23 0502 08/22/23 0450 08/22/23 1506 08/23/23 0453 08/24/23 0459 08/25/23 0501 08/26/23 0520  NA 133* 125* 124* 130* 130* 129* 135  -Continue to Monitor and Trend and repeat CMP in the AM sodium remains stable -Now nephrology has stopped her Salt Tablets and started her on Furosemide 10 mg po Daily and feel she needs to be gentle with diuretics given her Age    Hyperkalemia, improved  -K+ was 6.5 Unclear if it was hemolysis, repeat lab checked in 2 minutes, not sure if it was a new sample -Treated with Lokelma, insulin D50, albuterol neb -K+ Trend: Recent Labs  Lab 08/21/23 2303 08/22/23 0450 08/22/23 1506 08/23/23 0453 08/24/23 0459 08/25/23 0501 08/26/23 0520  K 4.9 4.8 4.5 4.8 4.8 4.7 4.6  -Continue to Monitor and Trend and repeat CMP in the AM    Essential Hypertension -BP currently stable, currently on Amlodipine 10 mg po daily -C/w IV Hydralazine 10 mg q4hprn HBP for SBP >180 or DBP >110 -Continue to Monitor BP per Protocol -Last BP reading was 146/46  Acute Urinary Retention -Strict I's and  O's and Daily Weights and Continue Bladder Scanning -C/w Tamsulosin 0.4 mg po Daily x 7 Days  -Attempt TOV this Afternoon and if continues to retain will need reinsertion of Foley    Hyperlipidemia -Continue Simvastatin 20 mg po Daily    CKD (Chronic Kidney Disease), Stage 3b Metabolic Acidosis  Elevated BUN -Has a baseline creatinine of 1.4-1.7 and follows with Dr.  Valentino Nose in the clinic -BUN/Cr Trend: Recent Labs  Lab 08/21/23 0502 08/22/23 0450 08/22/23 1506 08/23/23 0453 08/24/23 0459 08/25/23 0501 08/26/23 0520  BUN 23 25* 24* 57* 104* 141* 94*  CREATININE 1.40* 1.38* 1.58* 1.63* 1.54* 1.76* 1.49*  -Avoid Nephrotoxic Medications, Contrast Dyes, Hypotension and Dehydration to Ensure Adequate Renal Perfusion and will need to Renally Adjust Meds -Now Nephrology placing her on Po Lasix 10 mg daily  -BUN/Cr is out of proportion compared to creatinine and this is likely related to her Urea supplements but given that her hemoglobin slightly dropped we will need to rule out upper GI bleeding -Nephrology now recommending also holding the trimethoprim -Continue to Monitor and Trend Renal Function carefully and repeat CMP in the AM    Chronic Anemia, known history of iron deficiency -Per daughter, patient follows [facility], has known history of iron deficiency and takes iron supplement 325 mg daily -Hgb/Hct Trend: Recent Labs  Lab 08/20/23 0507 08/22/23 0443 08/23/23 0453 08/24/23 0459 08/25/23 0501 08/25/23 1805 08/26/23 0520  HGB 7.6* 7.4* 7.6* 7.0* 6.9* 8.0* 7.7*  HCT 23.6* 22.1* 23.5* 20.7* 20.5* 24.8* 23.0*  MCV 88.7 88.0 90.0 87.7 87.2  --  86.8  -Anemia panel done and showed an iron level of 19, UIBC of 233, TIBC 252, saturation ratios of 8%, ferritin level of 136, folate level 30.8, vitamin B12 1001 -FOBT is NEGATIVE -Hold Lovenox for now -Will need to R/o Upper GIB given worsened BUN and Anemia -She received IV iron infusion this hospitalization and will get 1 unit of pRBC -Continue to monitor for signs and symptoms of bleeding; no overt bleeding noted -Nephrology -Will need continued iron supplementation at discharge -Repeat CBC in the AM and if hemoglobin continues to remain low may need GI involvement  Fever, improved  -Low Grade at 100.6 during this hospitalization -? Related to all the blankets patient had  but will check Blood Cx x2 and RUQ U/S -Recent urinalysis done showed large hemoglobin, trace leukocytes, negative nitrites, rare bacteria, 21-50 RBCs per high-power field, 0-5 squamous epithelial cells and 0-5 WBCs -Consider repeating if she continues to have fevers but she has not had a fever today. -Blood cultures x 2 obtained and 1 out of the 3 showed Gram-Positive Rods which is likely contaminant -Chest x-ray done and showed "No active disease. Aortic Atherosclerosis" -Checked PCT and was 0.16 and Lactic Acid was 0.6 in the AM -Continue to Monitor For S/Sx of Infection  Abnormal LFTs, slowly improving  -Unclear and possibly Reactive? -LFT Trend: Recent Labs  Lab 08/16/23 1220 08/24/23 0459 08/25/23 0501 08/26/23 0520  AST 40 421* 317* 205*  ALT 39 444* 377* 315*  BILITOT 1.0 0.7 0.4 0.4  ALKPHOS 80 214* 225* 213*  -Check RUQ U/S and showed "Gallbladder wall thickening without a reported Murphy sign or gallstones. Findings are indeterminate but could reflect cholecystitis in the correct clinical setting. A HIDA scan could further evaluate clinically warranted.. Diffusely increased echogenicity of the liver, which is most commonly secondary to hepatic steatosis." -Acute Hepatitis Panel which is NEGATIVE -HIDA done and showed "Patent cystic  and common bile ducts. Normal gallbladder ejection fraction." -Continue to Monitor and Trend Hepatic Fxn Panel and repeat in the AM  Right Arm Swelling, improving  -? In the Setting of IV Infiltration -Continue Elevate and Warm Compresses    Right Ankle Sprain -Per Family she twisted her Ankle PTA -X-ray done and showed "Asymmetric moderate soft tissue swelling overlying the lateral malleolus without discrete underlying fracture, concerning for soft tissue/ligamentous injury." -Mobilize with PT OT and they are recommending Home Health PT  Constipation, improved -C/w Bowel Regimen and add Bisacodyl Suppository since she still has not had a  bowel movement -Had a Large Bowel Movement yesterday  Hypoalbuminemia -Patient's Albumin Trend: Recent Labs  Lab 08/16/23 1220 08/20/23 0507 08/21/23 0502 08/24/23 0459 08/25/23 0501 08/26/23 0520  ALBUMIN 3.7 2.7* 2.8* 2.3* 2.5* 2.7*  -Continue to Monitor and Trend and repeat CMP in the AM  Obesity -Complicates overall prognosis and care -Estimated body mass index is 31.15 kg/m as calculated from the following:   Height as of this encounter: 5\' 5"  (1.651 m).   Weight as of this encounter: 84.9 kg.  -Weight Loss and Dietary Counseling given  DVT prophylaxis: Place and maintain sequential compression device Start: 08/22/23 1247    Code Status: Full Code Family Communication: Discussed with daughter present at bedside   Disposition Plan:  Level of care: Progressive Status is: Inpatient Remains inpatient appropriate because: Will monitor overnight and see how she does with foley out to see if she retains   Consultants:  Nephrology   Procedures:  As delineated as above  Antimicrobials:  Anti-infectives (From admission, onward)    Start     Dose/Rate Route Frequency Ordered Stop   08/18/23 1830  trimethoprim (TRIMPEX) tablet 50 mg  Status:  Discontinued        50 mg Oral Daily 08/18/23 1731 08/25/23 1556   08/16/23 1615  cephALEXin (KEFLEX) capsule 500 mg        500 mg Oral Every 8 hours 08/16/23 1606 08/17/23 1339       Subjective: Seen and examined at bedside and she is in the bed without any issues.  No nausea or vomiting.  Wanting to rest.  Feels relatively well.  Daughter thinks that she is doing better.  No other concerns or complaints at this time but daughter is concerned about going home with a Foley catheter if she fails her TOV.   Objective: Vitals:   08/26/23 0453 08/26/23 0500 08/26/23 1037 08/26/23 1222  BP: (!) 137/49  (!) 135/47 (!) 146/46  Pulse: 81   86  Resp: 16     Temp: 98.3 F (36.8 C)   98.4 F (36.9 C)  TempSrc: Oral   Oral  SpO2:  100%   100%  Weight:  84.9 kg    Height:        Intake/Output Summary (Last 24 hours) at 08/26/2023 1743 Last data filed at 08/26/2023 1500 Gross per 24 hour  Intake 340 ml  Output 2375 ml  Net -2035 ml   Filed Weights   08/24/23 0500 08/25/23 0500 08/26/23 0500  Weight: 88.4 kg 80.8 kg 84.9 kg   Examination: Physical Exam:  Constitutional: WN/WD obese elderly African-American female in no acute distress Respiratory: Diminished to auscultation bilaterally, no wheezing, rales, rhonchi or crackles. Normal respiratory effort and patient is not tachypenic. No accessory muscle use.  Unlabored breathing Cardiovascular: RRR, no murmurs / rubs / gallops. S1 and S2 auscultated.  Mild trace extremity edema Abdomen: Soft,  non-tender, secondary to body habitus.  Bowel sounds positive.  GU: Deferred. Musculoskeletal: Has a right ankle ASO Skin: No rashes, lesions, ulcers limited skin evaluation. No induration; Warm and dry.  Neurologic: CN 2-12 grossly intact with no focal deficits. Romberg sign cerebellar reflexes not assessed.  Psychiatric: She is awake and alert.  Pleasant and normal mood and appropriate affect.   Data Reviewed: I have personally reviewed following labs and imaging studies  CBC: Recent Labs  Lab 08/20/23 0507 08/22/23 0443 08/23/23 0453 08/24/23 0459 08/25/23 0501 08/25/23 1805 08/26/23 0520  WBC 10.0 8.0 8.5 8.5 9.1  --  8.7  NEUTROABS 6.6  --   --  6.0 6.3  --  5.9  HGB 7.6* 7.4* 7.6* 7.0* 6.9* 8.0* 7.7*  HCT 23.6* 22.1* 23.5* 20.7* 20.5* 24.8* 23.0*  MCV 88.7 88.0 90.0 87.7 87.2  --  86.8  PLT 189 212 218 218 241  --  264   Basic Metabolic Panel: Recent Labs  Lab 08/20/23 0507 08/21/23 0500 08/21/23 0502 08/21/23 1239 08/22/23 1506 08/23/23 0453 08/24/23 0459 08/25/23 0501 08/26/23 0520  NA 127*  126*   < > 133*   < > 124* 130* 130* 129* 135  K 4.8  4.7   < > 6.5*   < > 4.5 4.8 4.8 4.7 4.6  CL 98  98   < > 105   < > 97* 98 98 97* 99  CO2 22   20*   < > 19*   < > 18* 21* 23 24 26   GLUCOSE 86  86   < > 97   < > 135* 92 104* 118* 107*  BUN 22  22   < > 23   < > 24* 57* 104* 141* 94*  CREATININE 1.36*  1.38*   < > 1.40*   < > 1.58* 1.63* 1.54* 1.76* 1.49*  CALCIUM 8.8*  8.7*   < > 9.4   < > 8.9 9.5 9.4 9.7 10.1  MG 2.1  --   --   --   --   --  2.3 2.5* 2.4  PHOS 3.1  --  3.3  --   --   --  3.9 3.7 3.3   < > = values in this interval not displayed.   GFR: Estimated Creatinine Clearance: 25.4 mL/min (A) (by C-G formula based on SCr of 1.49 mg/dL (H)). Liver Function Tests: Recent Labs  Lab 08/20/23 0507 08/21/23 0502 08/24/23 0459 08/25/23 0501 08/26/23 0520  AST  --   --  421* 317* 205*  ALT  --   --  444* 377* 315*  ALKPHOS  --   --  214* 225* 213*  BILITOT  --   --  0.7 0.4 0.4  PROT  --   --  5.6* 6.1* 6.1*  ALBUMIN 2.7* 2.8* 2.3* 2.5* 2.7*   No results for input(s): "LIPASE", "AMYLASE" in the last 168 hours. No results for input(s): "AMMONIA" in the last 168 hours. Coagulation Profile: No results for input(s): "INR", "PROTIME" in the last 168 hours. Cardiac Enzymes: Recent Labs  Lab 08/24/23 0459  CKTOTAL 405*   BNP (last 3 results) No results for input(s): "PROBNP" in the last 8760 hours. HbA1C: No results for input(s): "HGBA1C" in the last 72 hours. CBG: Recent Labs  Lab 08/24/23 0741 08/24/23 1131 08/24/23 1544 08/25/23 0751 08/26/23 0744  GLUCAP 91 174* 186* 124* 90   Lipid Profile: No results for input(s): "CHOL", "HDL", "LDLCALC", "TRIG", "CHOLHDL", "LDLDIRECT"  in the last 72 hours. Thyroid Function Tests: No results for input(s): "TSH", "T4TOTAL", "FREET4", "T3FREE", "THYROIDAB" in the last 72 hours. Anemia Panel: No results for input(s): "VITAMINB12", "FOLATE", "FERRITIN", "TIBC", "IRON", "RETICCTPCT" in the last 72 hours. Sepsis Labs: Recent Labs  Lab 08/26/23 0520 08/26/23 0522  PROCALCITON 0.16  --   LATICACIDVEN  --  0.6   Recent Results (from the past 240 hour(s))   Culture, blood (Routine X 2) w Reflex to ID Panel     Status: None (Preliminary result)   Collection Time: 08/24/23  8:57 AM   Specimen: BLOOD RIGHT ARM  Result Value Ref Range Status   Specimen Description   Final    BLOOD RIGHT ARM Performed at [facility], [address], [place], [place] [address]    Special Requests   Final    BOTTLES DRAWN AEROBIC AND ANAEROBIC Blood Culture adequate volume Performed at [facility], [address] [place], [place] [address]    Culture  Setup Time   Final    GRAM POSITIVE RODS AEROBIC BOTTLE ONLY CRITICAL RESULT CALLED TO, READ BACK BY AND VERIFIED WITH: PHARMD DREW WOFFORD ON 08/25/23 @ 1539 BY DRT Performed at [facility], [address] [place], [place] [address]    Culture GRAM POSITIVE RODS  Final   Report Status PENDING  Incomplete  Culture, blood (Routine X 2) w Reflex to ID Panel     Status: None (Preliminary result)   Collection Time: 08/24/23  8:57 AM   Specimen: BLOOD RIGHT HAND  Result Value Ref Range Status   Specimen Description   Final    BLOOD RIGHT HAND Performed at [facility], [address], [place], [place] [address]    Special Requests   Final    BOTTLES DRAWN AEROBIC ONLY Blood Culture results may not be optimal due to an inadequate volume of blood received in culture bottles Performed at [facility], [address] [place], [place] [address]    Culture   Final    NO GROWTH 2 DAYS Performed at [facility], [address] [place], [place] [address]    Report Status PENDING  Incomplete    Radiology Studies: NM Hepato W/EF  Result Date: 08/25/2023 CLINICAL DATA:  Cholelithiasis, abnormal LFTs. EXAM: NUCLEAR MEDICINE HEPATOBILIARY IMAGING WITH GALLBLADDER EF TECHNIQUE: Sequential images of the abdomen were obtained out to 60 minutes following intravenous administration of radiopharmaceutical. After oral ingestion of Ensure, gallbladder ejection  fraction was determined. At 60 min, normal ejection fraction is greater than 33%. RADIOPHARMACEUTICALS:  5.1 mCi Tc-54m  Choletec IV COMPARISON:  Ultrasound August 25, 2023 FINDINGS: Prompt uptake and biliary excretion of activity by the liver is seen. Gallbladder activity is visualized, consistent with patency of cystic duct. Biliary activity passes into small bowel, consistent with patent common bile duct. Calculated gallbladder ejection fraction is 59%. (Normal gallbladder ejection fraction with Ensure is greater than 33% and less than 80%.) IMPRESSION: 1.  Patent cystic and common bile ducts. 2.  Normal gallbladder ejection fraction. Electronically Signed   By: Maudry Mayhew M.D.   On: 08/25/2023 12:07    Scheduled Meds:  amLODipine  10 mg Oral Daily   ascorbic acid  1,000 mg Oral Daily   aspirin EC  81 mg Oral Q M,W,F   Chlorhexidine Gluconate Cloth  6 each Topical Daily   darbepoetin (ARANESP) injection - NON-DIALYSIS  60 mcg Subcutaneous Once   [START  ON 08/27/2023] furosemide  10 mg Oral Daily   influenza vaccine adjuvanted  0.5 mL Intramuscular Tomorrow-1000   multivitamin with minerals  1 tablet Oral Daily   mouth rinse  15 mL Mouth Rinse 4 times per day   pneumococcal 20-valent conjugate vaccine  0.5 mL Intramuscular Tomorrow-1000   senna  2 tablet Oral Daily   tamsulosin  0.4 mg Oral Daily   Continuous Infusions:   LOS: 8 days   Marguerita Merles, DO [facility] Available via Epic secure chat 7am-7pm After these hours, please refer to coverage provider listed on [email or web address] 08/26/2023, 5:43 PM

## 2023-08-26 NOTE — Progress Notes (Signed)
[name]  Assessment/ Plan: Pt is a 89 y.o. yo female with medical history significant for hypertension, DM, HLD, consulted for hyponatremia.  # Hyponatremia: TSH and cortisol normal.  HCTZ was stopped.  Initially appears concern for SIADH and received tolvaptan 1 dose without much improvement.  Also received IV fluid but downtrending sodium level.  She was placed on urea salt tablet as well. The urea tablet was discontinued because of rising BUN level.  Given oral Lasix 20 mg on 10/18 with robust urine output and improvement of serum sodium level and creatinine level. I will go ahead and stop salt tablet and start furosemide 10 mg daily.  Need to be gentle with diuretics because of her age.  # Azotemia/very high BUN: Need to rule out GI bleed.  Presumably due to CKD and recent start of urea supplement.  Fortunately, no sign or symptoms of uremia.   BUN level improving.  # CKD 3b: Baseline creatinine level around 1.4-1.7 and follows with Dr. Valentino Nose.  There was some concern about acute urinary retention, has Foley catheter.   Creatinine level at baseline. Trial of void when ambulating.  # Anemia, need to rule out GI: Requiring blood transfusion   Also received IV iron during this hospitalization.  I will order a dose of Aranesp today.  # HTN/volume: Blood pressure acceptable.  On amlodipine.  Sign off, please call us back with question.  Patient will follow at [facility] after discharge.  Discussed with the patient's daughter as well.  Subjective: Seen and examined at bedside.  Received oral Lasix 20 mg yesterday with robust urine output around 3.4 L.  Sodium level improved.  Patient denies any new complaint or concern.  Denies nausea, vomiting, chest pain or shortness of breath.  Her daughter was presented with her.  Objective Vital signs in last 24 hours: Vitals:   08/25/23 1528 08/25/23 2117 08/26/23 0453 08/26/23 0500  BP: (!) 143/47  (!) 128/44 (!) 137/49   Pulse: 85 84 81   Resp: 20  16   Temp: (!) 97.5 F (36.4 C) 98.1 F (36.7 C) 98.3 F (36.8 C)   TempSrc: Oral Oral Oral   SpO2: 100% 100% 100%   Weight:    84.9 kg  Height:       Weight change: 4.1 kg  Intake/Output Summary (Last 24 hours) at 08/26/2023 0935 Last data filed at 08/26/2023 0459 Gross per 24 hour  Intake 900.17 ml  Output 2900 ml  Net -1999.83 ml       Labs: RENAL PANEL Recent Labs  Lab 08/20/23 0507 08/21/23 0500 08/21/23 0502 08/21/23 1239 08/22/23 1506 08/23/23 0453 08/24/23 0459 08/25/23 0501 08/26/23 0520  NA 127*  126*   < > 133*   < > 124* 130* 130* 129* 135  K 4.8  4.7   < > 6.5*   < > 4.5 4.8 4.8 4.7 4.6  CL 98  98   < > 105   < > 97* 98 98 97* 99  CO2 22  20*   < > 19*   < > 18* 21* 23 24 26   GLUCOSE 86  86   < > 97   < > 135* 92 104* 118* 107*  BUN 22  22   < > 23   < > 24* 57* 104* 141* 94*  CREATININE 1.36*  1.38*   < > 1.40*   < > 1.58* 1.63* 1.54* 1.76* 1.49*  CALCIUM 8.8*  8.7*   < > 9.4   < > 8.9 9.5 9.4 9.7 10.1  MG 2.1  --   --   --   --   --  2.3 2.5* 2.4  PHOS 3.1  --  3.3  --   --   --  3.9 3.7 3.3  ALBUMIN 2.7*  --  2.8*  --   --   --  2.3* 2.5* 2.7*   < > = values in this interval not displayed.    Liver Function Tests: Recent Labs  Lab 08/24/23 0459 08/25/23 0501 08/26/23 0520  AST 421* 317* 205*  ALT 444* 377* 315*  ALKPHOS 214* 225* 213*  BILITOT 0.7 0.4 0.4  PROT 5.6* 6.1* 6.1*  ALBUMIN 2.3* 2.5* 2.7*   No results for input(s): "LIPASE", "AMYLASE" in the last 168 hours. No results for input(s): "AMMONIA" in the last 168 hours. CBC: Recent Labs    08/22/23 1506 08/23/23 0453 08/24/23 0459 08/25/23 0501 08/25/23 1805 08/26/23 0520  HGB  --  7.6* 7.0* 6.9* 8.0* 7.7*  MCV  --  90.0 87.7 87.2  --  86.8  VITAMINB12 1,001*  --   --   --   --   --   FOLATE 30.8  --   --   --   --   --   FERRITIN 136  --   --   --   --   --   TIBC 252  --   --   --   --   --   IRON 19*   --   --   --   --   --   RETICCTPCT 1.5  --   --   --   --   --     Cardiac Enzymes: Recent Labs  Lab 08/24/23 0459  CKTOTAL 405*   CBG: Recent Labs  Lab 08/24/23 0741 08/24/23 1131 08/24/23 1544 08/25/23 0751 08/26/23 0744  GLUCAP 91 174* 186* 124* 90    Iron Studies: No results for input(s): "IRON", "TIBC", "TRANSFERRIN", "FERRITIN" in the last 72 hours. Studies/Results: NM Hepato W/EF  Result Date: 08/25/2023 CLINICAL DATA:  Cholelithiasis, abnormal LFTs. EXAM: NUCLEAR MEDICINE HEPATOBILIARY IMAGING WITH GALLBLADDER EF TECHNIQUE: Sequential images of the abdomen were obtained out to 60 minutes following intravenous administration of radiopharmaceutical. After oral ingestion of Ensure, gallbladder ejection fraction was determined. At 60 min, normal ejection fraction is greater than 33%. RADIOPHARMACEUTICALS:  5.1 mCi Tc-80m  Choletec IV COMPARISON:  Ultrasound August 25, 2023 FINDINGS: Prompt uptake and biliary excretion of activity by the liver is seen. Gallbladder activity is visualized, consistent with patency of cystic duct. Biliary activity passes into small bowel, consistent with patent common bile duct. Calculated gallbladder ejection fraction is 59%. (Normal gallbladder ejection fraction with Ensure is greater than 33% and less than 80%.) IMPRESSION: 1.  Patent cystic and common bile ducts. 2.  Normal gallbladder ejection fraction. Electronically Signed   By: Maudry Mayhew M.D.   On: 08/25/2023 12:07   US Abdomen Limited RUQ (LIVER/GB)  Result Date: 08/24/2023 CLINICAL DATA:  [id number] Abnormal LFTs [id number] EXAM: ULTRASOUND ABDOMEN LIMITED RIGHT UPPER QUADRANT COMPARISON:  None Available. FINDINGS: Gallbladder: Gallbladder wall thickening, measuring up to 6 mm. No sonographic Murphy sign noted by sonographer. No gallstones identified. Common bile duct: Diameter: 2.7 mm within normal limits. Liver: No focal lesion identified. Increased echogenicity diffusely. Portal vein is  patent on color Doppler imaging with normal direction of blood flow towards the liver.  IMPRESSION: 1. Gallbladder wall thickening without a reported Murphy sign or gallstones. Findings are indeterminate but could reflect cholecystitis in the correct clinical setting. A HIDA scan could further evaluate clinically warranted. 2. Diffusely increased echogenicity of the liver, which is most commonly secondary to hepatic steatosis. Electronically Signed   By: Feliberto Harts M.D.   On: 08/24/2023 13:38    Medications: Infusions:   Scheduled Medications:  amLODipine  10 mg Oral Daily   ascorbic acid  1,000 mg Oral Daily   aspirin EC  81 mg Oral Q M,W,F   Chlorhexidine Gluconate Cloth  6 each Topical Daily   influenza vaccine adjuvanted  0.5 mL Intramuscular Tomorrow-1000   multivitamin with minerals  1 tablet Oral Daily   mouth rinse  15 mL Mouth Rinse 4 times per day   pneumococcal 20-valent conjugate vaccine  0.5 mL Intramuscular Tomorrow-1000   senna  2 tablet Oral Daily   sodium chloride  1 g Oral TID WC   tamsulosin  0.4 mg Oral Daily    have reviewed scheduled and prn medications.  Physical Exam: General:NAD, comfortable Heart:RRR, s1s2 nl Lungs:clear b/l, no crackle Abdomen:soft, Non-tender, non-distended Extremities:No edema Neurology: Alert, awake and following command.  Joanne Patterson 08/26/2023,9:35 AM  LOS: 8 days

## 2023-08-26 NOTE — Plan of Care (Signed)
  Problem: Clinical Measurements: Goal: Ability to maintain clinical measurements within normal limits will improve Outcome: Progressing   Problem: Elimination: Goal: Will not experience complications related to bowel motility Outcome: Progressing   Problem: Safety: Goal: Ability to remain free from injury will improve Outcome: Progressing   

## 2023-08-27 DIAGNOSIS — E871 Hypo-osmolality and hyponatremia: Secondary | ICD-10-CM | POA: Diagnosis not present

## 2023-08-27 DIAGNOSIS — I10 Essential (primary) hypertension: Secondary | ICD-10-CM | POA: Diagnosis not present

## 2023-08-27 DIAGNOSIS — N184 Chronic kidney disease, stage 4 (severe): Secondary | ICD-10-CM | POA: Diagnosis not present

## 2023-08-27 DIAGNOSIS — E785 Hyperlipidemia, unspecified: Secondary | ICD-10-CM | POA: Diagnosis not present

## 2023-08-27 LAB — CBC WITH DIFFERENTIAL/PLATELET
Abs Immature Granulocytes: 0.07 10*3/uL (ref 0.00–0.07)
Basophils Absolute: 0 10*3/uL (ref 0.0–0.1)
Basophils Relative: 0 %
Eosinophils Absolute: 0.2 10*3/uL (ref 0.0–0.5)
Eosinophils Relative: 2 %
HCT: 23.2 % — ABNORMAL LOW (ref 36.0–46.0)
Hemoglobin: 7.7 g/dL — ABNORMAL LOW (ref 12.0–15.0)
Immature Granulocytes: 1 %
Lymphocytes Relative: 22 %
Lymphs Abs: 2.1 10*3/uL (ref 0.7–4.0)
MCH: 29.4 pg (ref 26.0–34.0)
MCHC: 33.2 g/dL (ref 30.0–36.0)
MCV: 88.5 fL (ref 80.0–100.0)
Monocytes Absolute: 0.9 10*3/uL (ref 0.1–1.0)
Monocytes Relative: 10 %
Neutro Abs: 6.2 10*3/uL (ref 1.7–7.7)
Neutrophils Relative %: 65 %
Platelets: 270 10*3/uL (ref 150–400)
RBC: 2.62 MIL/uL — ABNORMAL LOW (ref 3.87–5.11)
RDW: 14.8 % (ref 11.5–15.5)
WBC: 9.5 10*3/uL (ref 4.0–10.5)
nRBC: 0 % (ref 0.0–0.2)

## 2023-08-27 LAB — COMPREHENSIVE METABOLIC PANEL
ALT: 240 U/L — ABNORMAL HIGH (ref 0–44)
AST: 139 U/L — ABNORMAL HIGH (ref 15–41)
Albumin: 2.8 g/dL — ABNORMAL LOW (ref 3.5–5.0)
Alkaline Phosphatase: 191 U/L — ABNORMAL HIGH (ref 38–126)
Anion gap: 7 (ref 5–15)
BUN: 59 mg/dL — ABNORMAL HIGH (ref 8–23)
CO2: 26 mmol/L (ref 22–32)
Calcium: 9.8 mg/dL (ref 8.9–10.3)
Chloride: 103 mmol/L (ref 98–111)
Creatinine, Ser: 1.36 mg/dL — ABNORMAL HIGH (ref 0.44–1.00)
GFR, Estimated: 36 mL/min — ABNORMAL LOW (ref >60)
Glucose, Bld: 104 mg/dL — ABNORMAL HIGH (ref 70–99)
Potassium: 4.6 mmol/L (ref 3.5–5.1)
Sodium: 136 mmol/L (ref 135–145)
Total Bilirubin: 0.6 mg/dL (ref 0.3–1.2)
Total Protein: 6.4 g/dL — ABNORMAL LOW (ref 6.5–8.1)

## 2023-08-27 LAB — PHOSPHORUS: Phosphorus: 3.2 mg/dL (ref 2.5–4.6)

## 2023-08-27 LAB — MAGNESIUM: Magnesium: 2.3 mg/dL (ref 1.7–2.4)

## 2023-08-27 LAB — GLUCOSE, CAPILLARY: Glucose-Capillary: 96 mg/dL (ref 70–99)

## 2023-08-27 MED ORDER — SIMVASTATIN 20 MG PO TABS: 20.0000 mg | ORAL_TABLET | Freq: Every evening | ORAL | Status: DC

## 2023-08-27 MED ORDER — TAMSULOSIN HCL 0.4 MG PO CAPS: 0.4000 mg | ORAL_CAPSULE | Freq: Every day | ORAL | 0 refills | Status: AC

## 2023-08-27 MED ORDER — OLMESARTAN MEDOXOMIL 40 MG PO TABS
40.0000 mg | ORAL_TABLET | Freq: Every day | ORAL | 11 refills | Status: AC
Start: 2023-08-27 — End: 2024-08-26

## 2023-08-27 MED ORDER — POLYVINYL ALCOHOL 1.4 % OP SOLN: 1.0000 [drp] | OPHTHALMIC | 0 refills | Status: AC | PRN

## 2023-08-27 MED ORDER — FUROSEMIDE 20 MG PO TABS: 10.0000 mg | ORAL_TABLET | Freq: Every day | ORAL | 0 refills | Status: AC

## 2023-08-27 MED ORDER — SENNA 8.6 MG PO TABS: 2.0000 | ORAL_TABLET | Freq: Every day | ORAL | 0 refills | Status: AC

## 2023-08-27 NOTE — Discharge Summary (Signed)
Physician Discharge Summary   Patient: Joanne Patterson MRN: [id number] DOB: 1928-02-27  Admit date:     08/16/2023  Discharge date: 08/27/23  Discharge Physician: Marguerita Merles, DO   PCP: Jarrett Soho, PA-C   Recommendations at discharge:  {Tip this will not be part of the note when signed- Example include specific recommendations for outpatient follow-up, pending tests to follow-up on. (Optional):26781}  ***  Discharge Diagnoses: Principal Problem:   Hyponatremia Active Problems:   Essential hypertension   Hyperlipidemia   CKD (chronic kidney disease), stage IV (HCC)  Resolved Problems:   * No resolved hospital problems. [facility] Course: The patient is a 89 year old elderly African-American female with a past medical history significant for but not limited to diabetes mellitus type 2 is diet controlled, hypertension, hyperlipidemia, history of recurrent UTI and bladder spasms who is seen at the neurology office and was found to have a sodium 120.  She is asked to come back to the hospital for further assessment and management.  Nephrology consulted.  She is being evaluated for symptomatic hyponatremia and sodium is now slowly improving.  Nephrology is now recommending stopping hydrochlorothiazide and starting urea 30 g twice daily.  Since BUN is elevated out of proportion to her creatinine so we will need to rule out upper GI bleeding and obtain FOBT but this is likely in the setting of her urea supplements and now the urea supplements have been stopped and FOBT is negative but given 1 unit PRBCs given her blood count being low.  LFTs were abnormal so we will obtain a right upper quadrant ultrasound and acute hepatitis panel and acute hepatitis panel was negative and right upper quadrant ultrasound done and showed concerning findings and recommendation was to get a HIDA scan the HIDA scan was negative.  She also spiked a temperature 100.6 this a.m. so we will obtain blood cultures  x 2 and continue to monitor.  LFTs are slowly improving and nephrology is now recommending stopping the urea as above given her significant elevated BUN and also initiating po Lasix 10 mg daily.  Will need to monitor carefully and is slowly improving. Will attempt a TOV today   Anticipating D/Cing in the next 24-48 hours  Assessment and Plan:  Symptomatic Hyponatremia -Presented with sodium of 120, trended down to 119 -Nephrology was consulted, multifactorial, medication effect from ARB and HCTZ, CKD stage IIIb and initial concern was for SIADH.  Euvolemic -Cortisol level normal, urine osmolality 319, urine sodium 62, serum osmolarity 278 -Patient received tolvaptan 15 mg but did not respond, subsequently received 0.9% normal saline and improved sodium.  Recommended to avoid HCTZ/ARB. -Repeat isotonic fluid hydration effusion did not cause the sodium to respond and Urine Osm was up to 497 so now nephrology was recommending Urea 30 g twice daily but this is now stopped given very Elevated BUN -Na+ Trend: Recent Labs  Lab 08/21/23 0502 08/22/23 0450 08/22/23 1506 08/23/23 0453 08/24/23 0459 08/25/23 0501 08/26/23 0520  NA 133* 125* 124* 130* 130* 129* 135  -Continue to Monitor and Trend and repeat CMP in the AM sodium remains stable -Now nephrology has stopped her Salt Tablets and started her on Furosemide 10 mg po Daily and feel she needs to be gentle with diuretics given her Age    Hyperkalemia, improved  -K+ was 6.5 Unclear if it was hemolysis, repeat lab checked in 2 minutes, not sure if it was a new sample -Treated with Lokelma, insulin D50, albuterol neb -K+  Trend: Recent Labs  Lab 08/21/23 2303 08/22/23 0450 08/22/23 1506 08/23/23 0453 08/24/23 0459 08/25/23 0501 08/26/23 0520  K 4.9 4.8 4.5 4.8 4.8 4.7 4.6  -Continue to Monitor and Trend and repeat CMP in the AM    Essential Hypertension -BP currently stable, currently on Amlodipine 10 mg po daily -C/w IV  Hydralazine 10 mg q4hprn HBP for SBP >180 or DBP >110 -Continue to Monitor BP per Protocol -Last BP reading was 146/46  Acute Urinary Retention -Strict I's and O's and Daily Weights and Continue Bladder Scanning -C/w Tamsulosin 0.4 mg po Daily x 7 Days  -Attempt TOV this Afternoon and if continues to retain will need reinsertion of Foley    Hyperlipidemia -Continue Simvastatin 20 mg po Daily    CKD (Chronic Kidney Disease), Stage 3b Metabolic Acidosis  Elevated BUN -Has a baseline creatinine of 1.4-1.7 and follows with Dr. Valentino Nose in the clinic -BUN/Cr Trend: Recent Labs  Lab 08/21/23 0502 08/22/23 0450 08/22/23 1506 08/23/23 0453 08/24/23 0459 08/25/23 0501 08/26/23 0520  BUN 23 25* 24* 57* 104* 141* 94*  CREATININE 1.40* 1.38* 1.58* 1.63* 1.54* 1.76* 1.49*  -Avoid Nephrotoxic Medications, Contrast Dyes, Hypotension and Dehydration to Ensure Adequate Renal Perfusion and will need to Renally Adjust Meds -Now Nephrology placing her on Po Lasix 10 mg daily  -BUN/Cr is out of proportion compared to creatinine and this is likely related to her Urea supplements but given that her hemoglobin slightly dropped we will need to rule out upper GI bleeding -Nephrology now recommending also holding the trimethoprim -Continue to Monitor and Trend Renal Function carefully and repeat CMP in the AM    Chronic Anemia, known history of iron deficiency -Per daughter, patient follows [facility], has known history of iron deficiency and takes iron supplement 325 mg daily -Hgb/Hct Trend: Recent Labs  Lab 08/20/23 0507 08/22/23 0443 08/23/23 0453 08/24/23 0459 08/25/23 0501 08/25/23 1805 08/26/23 0520  HGB 7.6* 7.4* 7.6* 7.0* 6.9* 8.0* 7.7*  HCT 23.6* 22.1* 23.5* 20.7* 20.5* 24.8* 23.0*  MCV 88.7 88.0 90.0 87.7 87.2  --  86.8  -Anemia panel done and showed an iron level of 19, UIBC of 233, TIBC 252, saturation ratios of 8%, ferritin level of 136, folate level 30.8, vitamin  B12 1001 -FOBT is NEGATIVE -Hold Lovenox for now -Will need to R/o Upper GIB given worsened BUN and Anemia -She received IV iron infusion this hospitalization and will get 1 unit of pRBC -Continue to monitor for signs and symptoms of bleeding; no overt bleeding noted -Nephrology -Will need continued iron supplementation at discharge -Repeat CBC in the AM and if hemoglobin continues to remain low may need GI involvement  Fever, improved  -Low Grade at 100.6 during this hospitalization -? Related to all the blankets patient had but will check Blood Cx x2 and RUQ U/S -Recent urinalysis done showed large hemoglobin, trace leukocytes, negative nitrites, rare bacteria, 21-50 RBCs per high-power field, 0-5 squamous epithelial cells and 0-5 WBCs -Consider repeating if she continues to have fevers but she has not had a fever today. -Blood cultures x 2 obtained and 1 out of the 3 showed Gram-Positive Rods which is likely contaminant -Chest x-ray done and showed "No active disease. Aortic Atherosclerosis" -Checked PCT and was 0.16 and Lactic Acid was 0.6 in the AM -Continue to Monitor For S/Sx of Infection  Abnormal LFTs, slowly improving  -Unclear and possibly Reactive? -LFT Trend: Recent Labs  Lab 08/16/23 1220 08/24/23 0459 08/25/23 0501 08/26/23 5784  AST 40 421* 317* 205*  ALT 39 444* 377* 315*  BILITOT 1.0 0.7 0.4 0.4  ALKPHOS 80 214* 225* 213*  -Check RUQ U/S and showed "Gallbladder wall thickening without a reported Murphy sign or gallstones. Findings are indeterminate but could reflect cholecystitis in the correct clinical setting. A HIDA scan could further evaluate clinically warranted.. Diffusely increased echogenicity of the liver, which is most commonly secondary to hepatic steatosis." -Acute Hepatitis Panel which is NEGATIVE -HIDA done and showed "Patent cystic and common bile ducts. Normal gallbladder ejection fraction." -Continue to Monitor and Trend Hepatic Fxn Panel and  repeat in the AM  Right Arm Swelling, improving  -? In the Setting of IV Infiltration -Continue Elevate and Warm Compresses    Right Ankle Sprain -Per Family she twisted her Ankle PTA -X-ray done and showed "Asymmetric moderate soft tissue swelling overlying the lateral malleolus without discrete underlying fracture, concerning for soft tissue/ligamentous injury." -Mobilize with PT OT and they are recommending Home Health PT  Constipation, improved -C/w Bowel Regimen and add Bisacodyl Suppository since she still has not had a bowel movement -Had a Large Bowel Movement yesterday  Hypoalbuminemia -Patient's Albumin Trend: Recent Labs  Lab 08/16/23 1220 08/20/23 0507 08/21/23 0502 08/24/23 0459 08/25/23 0501 08/26/23 0520  ALBUMIN 3.7 2.7* 2.8* 2.3* 2.5* 2.7*  -Continue to Monitor and Trend and repeat CMP in the AM  Obesity -Complicates overall prognosis and care -Estimated body mass index is 31.15 kg/m as calculated from the following:   Height as of this encounter: 5\' 5"  (1.651 m).   Weight as of this encounter: 84.9 kg.  -Weight Loss and Dietary Counseling given   Assessment and Plan: No notes have been filed under this hospital service. Service: Hospitalist     {Tip this will not be part of the note when signed Body mass index is 31.15 kg/m. , ,  (Optional):26781}  {(NOTE) Pain control PDMP Statment (Optional):26782} Consultants: *** Procedures performed: ***  Disposition: {Plan; Disposition:26390} Diet recommendation:  Discharge Diet Orders (From admission, onward)     Start     Ordered   08/27/23 0000  Diet - low sodium heart healthy        08/27/23 1347           {Diet_Plan:26776} DISCHARGE MEDICATION: Allergies as of 08/27/2023       Reactions   Diphenhydramine Other (See Comments)   Hallucinations, sleepiness        Medication List     STOP taking these medications    Aleve 220 MG tablet Generic drug: naproxen sodium   cephALEXin  500 MG capsule Commonly known as: KEFLEX   olmesartan-hydrochlorothiazide 40-12.5 MG tablet Commonly known as: BENICAR HCT       TAKE these medications    amLODipine 10 MG tablet Commonly known as: NORVASC Take 10 mg by mouth daily.   aspirin EC 81 MG tablet Take 81 mg by mouth every Monday, Wednesday, and Friday. Swallow whole.   Centrum Silver Ultra Womens Tabs Take 1 tablet by mouth daily with breakfast.   Fish Oil 1000 MG Caps Take 1,000 mg by mouth daily.   furosemide 20 MG tablet Commonly known as: LASIX Take 0.5 tablets (10 mg total) by mouth daily. Start taking on: August 28, 2023   loratadine 10 MG tablet Commonly known as: CLARITIN Take 10 mg by mouth daily as needed for allergies or rhinitis.   olmesartan 40 MG tablet Commonly known as: BENICAR Take 1 tablet (40 mg total) by  mouth daily.   Pataday 0.2 % Soln Generic drug: Olopatadine HCl Place 1 drop into both eyes 2 (two) times daily.   polyvinyl alcohol 1.4 % ophthalmic solution Commonly known as: LIQUIFILM TEARS Place 1 drop into both eyes as needed for dry eyes.   Refresh Tears PF 0.5-0.9 % Soln Generic drug: Carboxymethylcell-Glycerin PF Place 1 drop into both eyes daily.   senna 8.6 MG Tabs tablet Commonly known as: SENOKOT Take 2 tablets (17.2 mg total) by mouth daily. Start taking on: August 28, 2023   simvastatin 20 MG tablet Commonly known as: ZOCOR Take 1 tablet (20 mg total) by mouth every evening. Start taking on: September 03, 2023 What changed: These instructions start on September 03, 2023. If you are unsure what to do until then, ask your doctor or other care provider.   tamsulosin 0.4 MG Caps capsule Commonly known as: FLOMAX Take 1 capsule (0.4 mg total) by mouth daily for 2 days. Start taking on: August 28, 2023   TYLENOL 500 MG tablet Generic drug: acetaminophen Take 500-1,000 mg by mouth See admin instructions. Take 1,000 mg by mouth in the morning and an additional  500-1,000 mg by mouth once a day as needed for pain   vitamin C 1000 MG tablet Take 1,000 mg by mouth daily.               Durable Medical Equipment  (From admission, onward)           Start     Ordered   08/27/23 0000  For home use only DME standard manual wheelchair with seat cushion       Comments: Patient suffers from Ambulatory Dysfunction which impairs their ability to perform daily activities like bathing, dressing, feeding, grooming, and toileting in the home.  A walker will not resolve issue with performing activities of daily living. A wheelchair will allow patient to safely perform daily activities. Patient can safely propel the wheelchair in the home or has a caregiver who can provide assistance. Length of need 6 months . Accessories: elevating leg rests (ELRs), wheel locks, extensions and anti-tippers.   08/27/23 1347   08/27/23 0000  For home use only DME 3 n 1        08/27/23 1347   08/23/23 1244  For home use only DME 3 n 1  Once        08/23/23 1244            Follow-up Information     [facility] Follow up.   Why: w/c;bedside commode Contact information: [address] [place] [address] [phone]        [facility] Follow up.   Specialty: Home Health Services Why: [facility] nursing-labs;HH physical/occupational therapy Contact information: [address] [place] [address] [place] [address] [phone]                Discharge Exam: Ceasar Mons Weights   08/24/23 0500 08/25/23 0500 08/26/23 0500  Weight: 88.4 kg 80.8 kg 84.9 kg   ***  Condition at discharge: {DC Condition:26389}  The results of significant diagnostics from this hospitalization (including imaging, microbiology, ancillary and laboratory) are listed below for reference.   Imaging Studies: NM Hepato W/EF  Result Date: 08/25/2023 CLINICAL DATA:  Cholelithiasis, abnormal LFTs. EXAM: NUCLEAR MEDICINE HEPATOBILIARY IMAGING WITH GALLBLADDER EF TECHNIQUE: Sequential images  of the abdomen were obtained out to 60 minutes following intravenous administration of radiopharmaceutical. After oral ingestion of Ensure, gallbladder ejection fraction was  determined. At 60 min, normal ejection fraction is greater than 33%. RADIOPHARMACEUTICALS:  5.1 mCi Tc-14m  Choletec IV COMPARISON:  Ultrasound August 25, 2023 FINDINGS: Prompt uptake and biliary excretion of activity by the liver is seen. Gallbladder activity is visualized, consistent with patency of cystic duct. Biliary activity passes into small bowel, consistent with patent common bile duct. Calculated gallbladder ejection fraction is 59%. (Normal gallbladder ejection fraction with Ensure is greater than 33% and less than 80%.) IMPRESSION: 1.  Patent cystic and common bile ducts. 2.  Normal gallbladder ejection fraction. Electronically Signed   By: Maudry Mayhew M.D.   On: 08/25/2023 12:07   US Abdomen Limited RUQ (LIVER/GB)  Result Date: 08/24/2023 CLINICAL DATA:  [id number] Abnormal LFTs 132440 EXAM: ULTRASOUND ABDOMEN LIMITED RIGHT UPPER QUADRANT COMPARISON:  None Available. FINDINGS: Gallbladder: Gallbladder wall thickening, measuring up to 6 mm. No sonographic Murphy sign noted by sonographer. No gallstones identified. Common bile duct: Diameter: 2.7 mm within normal limits. Liver: No focal lesion identified. Increased echogenicity diffusely. Portal vein is patent on color Doppler imaging with normal direction of blood flow towards the liver. IMPRESSION: 1. Gallbladder wall thickening without a reported Murphy sign or gallstones. Findings are indeterminate but could reflect cholecystitis in the correct clinical setting. A HIDA scan could further evaluate clinically warranted. 2. Diffusely increased echogenicity of the liver, which is most commonly secondary to hepatic steatosis. Electronically Signed   By: Feliberto Harts M.D.   On: 08/24/2023 13:38   DG CHEST PORT 1 VIEW  Result Date: 08/18/2023 CLINICAL DATA:  Hyponatremia.  EXAM: PORTABLE CHEST 1 VIEW COMPARISON:  None Available. FINDINGS: The heart size and mediastinal contours are within normal limits. Both lungs are clear. The visualized skeletal structures are unremarkable. IMPRESSION: No active disease. Aortic Atherosclerosis (ICD10-I70.0). Electronically Signed   By: Lupita Raider M.D.   On: 08/18/2023 12:12   DG Ankle 2 Views Right  Result Date: 08/16/2023 CLINICAL DATA:  Right ankle pain.  No known injury. EXAM: RIGHT ANKLE - 2 VIEW COMPARISON:  None Available. FINDINGS: There is diffuse osteopenia of the visualized osseous structures. No acute fracture or dislocation. No aggressive osseous lesion. Ankle mortise appears intact. Mild diffuse degenerative changes of imaged joints. There is asymmetric moderate soft tissue swelling overlying the lateral malleolus without discrete underlying fracture, concerning for soft tissue/ligamentous injury. No radiopaque foreign bodies. IMPRESSION: 1. Asymmetric moderate soft tissue swelling overlying the lateral malleolus without discrete underlying fracture, concerning for soft tissue/ligamentous injury. Electronically Signed   By: Jules Schick M.D.   On: 08/16/2023 18:30    Microbiology: Results for orders placed or performed during the hospital encounter of 08/16/23  Culture, blood (Routine X 2) w Reflex to ID Panel     Status: None (Preliminary result)   Collection Time: 08/24/23  8:57 AM   Specimen: BLOOD RIGHT ARM  Result Value Ref Range Status   Specimen Description   Final    BLOOD RIGHT ARM Performed at [facility], [address] [place], [place] [address]    Special Requests   Final    BOTTLES DRAWN AEROBIC AND ANAEROBIC Blood Culture adequate volume Performed at [facility], [address] [place], [place] [address]    Culture  Setup Time   Final    GRAM POSITIVE RODS AEROBIC BOTTLE ONLY CRITICAL RESULT CALLED TO, READ BACK BY AND VERIFIED WITH: PHARMD DREW WOFFORD ON  08/25/23 @ 1539 BY DRT    Culture   Final  GRAM POSITIVE RODS CULTURE REINCUBATED FOR BETTER GROWTH Performed at [facility], [address], [place], [place] [address]    Report Status PENDING  Incomplete  Culture, blood (Routine X 2) w Reflex to ID Panel     Status: None (Preliminary result)   Collection Time: 08/24/23  8:57 AM   Specimen: BLOOD RIGHT HAND  Result Value Ref Range Status   Specimen Description   Final    BLOOD RIGHT HAND Performed at [facility], [address], [place], [place] [address]    Special Requests   Final    BOTTLES DRAWN AEROBIC ONLY Blood Culture results may not be optimal due to an inadequate volume of blood received in culture bottles Performed at [facility], [address] [place], [place] [address]    Culture   Final    NO GROWTH 3 DAYS Performed at [facility], [address], [place], [place] [address]    Report Status PENDING  Incomplete    Labs: CBC: Recent Labs  Lab 08/23/23 0453 08/24/23 0459 08/25/23 0501 08/25/23 1805 08/26/23 0520 08/27/23 0457  WBC 8.5 8.5 9.1  --  8.7 9.5  NEUTROABS  --  6.0 6.3  --  5.9 6.2  HGB 7.6* 7.0* 6.9* 8.0* 7.7* 7.7*  HCT 23.5* 20.7* 20.5* 24.8* 23.0* 23.2*  MCV 90.0 87.7 87.2  --  86.8 88.5  PLT 218 218 241  --  264 270   Basic Metabolic Panel: Recent Labs  Lab 08/21/23 0502 08/21/23 1239 08/23/23 0453 08/24/23 0459 08/25/23 0501 08/26/23 0520 08/27/23 0457  NA 133*   < > 130* 130* 129* 135 136  K 6.5*   < > 4.8 4.8 4.7 4.6 4.6  CL 105   < > 98 98 97* 99 103  CO2 19*   < > 21* 23 24 26 26   GLUCOSE 97   < > 92 104* 118* 107* 104*  BUN 23   < > 57* 104* 141* 94* 59*  CREATININE 1.40*   < > 1.63* 1.54* 1.76* 1.49* 1.36*  CALCIUM 9.4   < > 9.5 9.4 9.7 10.1 9.8  MG  --   --   --  2.3 2.5* 2.4 2.3  PHOS 3.3  --   --  3.9 3.7 3.3 3.2   < > = values in this interval not displayed.   Liver Function Tests: Recent Labs  Lab 08/21/23 0502  08/24/23 0459 08/25/23 0501 08/26/23 0520 08/27/23 0457  AST  --  421* 317* 205* 139*  ALT  --  444* 377* 315* 240*  ALKPHOS  --  214* 225* 213* 191*  BILITOT  --  0.7 0.4 0.4 0.6  PROT  --  5.6* 6.1* 6.1* 6.4*  ALBUMIN 2.8* 2.3* 2.5* 2.7* 2.8*   CBG: Recent Labs  Lab 08/24/23 1131 08/24/23 1544 08/25/23 0751 08/26/23 0744 08/27/23 0816  GLUCAP 174* 186* 124* 90 96    Discharge time spent: {LESS THAN/GREATER THAN:26388} 30 minutes.  Signed: Merlene Laughter, DO [facility] 08/27/2023

## 2023-08-27 NOTE — TOC Transition Note (Signed)
Transition of Care [facility]) - CM/SW Discharge Note   Patient Details  Name: Joanne Patterson MRN: [id number] Date of Birth: [birth year]  [facility] CM/SW Contact:  Georgie Chard, LCSW Phone Number: 08/27/2023, 2:25 PM   Clinical Narrative:     CSW has called [facility] for patient. At this time there are no further TOC needs.   Final next level of care: Home w Home Health Services Barriers to Discharge: Continued Medical Work up   Patient Goals and CMS Choice CMS Medicare.gov Compare Post Acute Care list provided to:: Patient Represenative (must comment) ([name]) Choice offered to / list presented to : Adult Children  Discharge Placement                         Discharge Plan and Services Additional resources added to the After Visit Summary for     Discharge Planning Services: CM Consult Post Acute Care Choice: Resumption of Svcs/PTA Provider          DME Arranged: Wheelchair manual DME Agency: [employer] Date DME Agency Contacted: 08/23/23 Time DME Agency Contacted: 1053 Representative spoke with at DME Agency: Vaughan Basta HH Arranged: PT, OT HH Agency: [facility] Date [facility] Agency Contacted: 08/23/23 Time HH Agency Contacted: 1053 Representative spoke with at [facility] Agency: Tresa Endo  Social Determinants of Health (SDOH) Interventions SDOH Screenings   Food Insecurity: No Food Insecurity (08/17/2023)  Housing: Low Risk  (08/17/2023)  Transportation Needs: No Transportation Needs (08/17/2023)  Utilities: Not At Risk (08/17/2023)  Tobacco Use: Low Risk  (08/17/2023)     Readmission Risk Interventions     No data to display

## 2023-08-28 LAB — TYPE AND SCREEN
ABO/RH(D): O POS
Antibody Screen: NEGATIVE
Unit division: 0

## 2023-08-28 LAB — CULTURE, BLOOD (ROUTINE X 2): Special Requests: ADEQUATE

## 2023-08-28 LAB — BPAM RBC
Blood Product Expiration Date: 202411162359
ISSUE DATE / TIME: 202410181122
Unit Type and Rh: 5100

## 2023-08-29 DIAGNOSIS — Z87440 Personal history of urinary (tract) infections: Secondary | ICD-10-CM | POA: Diagnosis not present

## 2023-08-29 DIAGNOSIS — E1122 Type 2 diabetes mellitus with diabetic chronic kidney disease: Secondary | ICD-10-CM | POA: Diagnosis not present

## 2023-08-29 DIAGNOSIS — R339 Retention of urine, unspecified: Secondary | ICD-10-CM | POA: Diagnosis not present

## 2023-08-29 DIAGNOSIS — D631 Anemia in chronic kidney disease: Secondary | ICD-10-CM | POA: Diagnosis not present

## 2023-08-29 DIAGNOSIS — I129 Hypertensive chronic kidney disease with stage 1 through stage 4 chronic kidney disease, or unspecified chronic kidney disease: Secondary | ICD-10-CM | POA: Diagnosis not present

## 2023-08-29 DIAGNOSIS — K5900 Constipation, unspecified: Secondary | ICD-10-CM | POA: Diagnosis not present

## 2023-08-29 DIAGNOSIS — E8809 Other disorders of plasma-protein metabolism, not elsewhere classified: Secondary | ICD-10-CM | POA: Diagnosis not present

## 2023-08-29 DIAGNOSIS — E871 Hypo-osmolality and hyponatremia: Secondary | ICD-10-CM | POA: Diagnosis not present

## 2023-08-29 DIAGNOSIS — S93401D Sprain of unspecified ligament of right ankle, subsequent encounter: Secondary | ICD-10-CM | POA: Diagnosis not present

## 2023-08-29 DIAGNOSIS — E611 Iron deficiency: Secondary | ICD-10-CM | POA: Diagnosis not present

## 2023-08-29 DIAGNOSIS — E872 Acidosis, unspecified: Secondary | ICD-10-CM | POA: Diagnosis not present

## 2023-08-29 DIAGNOSIS — R7989 Other specified abnormal findings of blood chemistry: Secondary | ICD-10-CM

## 2023-08-29 DIAGNOSIS — Z7982 Long term (current) use of aspirin: Secondary | ICD-10-CM | POA: Diagnosis not present

## 2023-08-29 DIAGNOSIS — N184 Chronic kidney disease, stage 4 (severe): Secondary | ICD-10-CM | POA: Diagnosis not present

## 2023-08-29 DIAGNOSIS — E11319 Type 2 diabetes mellitus with unspecified diabetic retinopathy without macular edema: Secondary | ICD-10-CM | POA: Diagnosis not present

## 2023-08-29 DIAGNOSIS — E785 Hyperlipidemia, unspecified: Secondary | ICD-10-CM | POA: Diagnosis not present

## 2023-08-29 DIAGNOSIS — E875 Hyperkalemia: Secondary | ICD-10-CM | POA: Diagnosis not present

## 2023-08-29 LAB — CULTURE, BLOOD (ROUTINE X 2): Culture: NO GROWTH

## 2023-08-30 DIAGNOSIS — E785 Hyperlipidemia, unspecified: Secondary | ICD-10-CM | POA: Diagnosis not present

## 2023-08-30 DIAGNOSIS — K5900 Constipation, unspecified: Secondary | ICD-10-CM | POA: Diagnosis not present

## 2023-08-30 DIAGNOSIS — D631 Anemia in chronic kidney disease: Secondary | ICD-10-CM | POA: Diagnosis not present

## 2023-08-30 DIAGNOSIS — E11319 Type 2 diabetes mellitus with unspecified diabetic retinopathy without macular edema: Secondary | ICD-10-CM | POA: Diagnosis not present

## 2023-08-30 DIAGNOSIS — E8809 Other disorders of plasma-protein metabolism, not elsewhere classified: Secondary | ICD-10-CM | POA: Diagnosis not present

## 2023-08-30 DIAGNOSIS — Z87440 Personal history of urinary (tract) infections: Secondary | ICD-10-CM | POA: Diagnosis not present

## 2023-08-30 DIAGNOSIS — S93401D Sprain of unspecified ligament of right ankle, subsequent encounter: Secondary | ICD-10-CM | POA: Diagnosis not present

## 2023-08-30 DIAGNOSIS — Z7982 Long term (current) use of aspirin: Secondary | ICD-10-CM | POA: Diagnosis not present

## 2023-08-30 DIAGNOSIS — E875 Hyperkalemia: Secondary | ICD-10-CM | POA: Diagnosis not present

## 2023-08-30 DIAGNOSIS — E871 Hypo-osmolality and hyponatremia: Secondary | ICD-10-CM | POA: Diagnosis not present

## 2023-08-30 DIAGNOSIS — I129 Hypertensive chronic kidney disease with stage 1 through stage 4 chronic kidney disease, or unspecified chronic kidney disease: Secondary | ICD-10-CM | POA: Diagnosis not present

## 2023-08-30 DIAGNOSIS — N184 Chronic kidney disease, stage 4 (severe): Secondary | ICD-10-CM | POA: Diagnosis not present

## 2023-08-30 DIAGNOSIS — E611 Iron deficiency: Secondary | ICD-10-CM | POA: Diagnosis not present

## 2023-08-30 DIAGNOSIS — E872 Acidosis, unspecified: Secondary | ICD-10-CM | POA: Diagnosis not present

## 2023-08-30 DIAGNOSIS — R339 Retention of urine, unspecified: Secondary | ICD-10-CM | POA: Diagnosis not present

## 2023-08-30 DIAGNOSIS — E1122 Type 2 diabetes mellitus with diabetic chronic kidney disease: Secondary | ICD-10-CM | POA: Diagnosis not present

## 2023-09-01 DIAGNOSIS — E1122 Type 2 diabetes mellitus with diabetic chronic kidney disease: Secondary | ICD-10-CM | POA: Diagnosis not present

## 2023-09-01 DIAGNOSIS — E871 Hypo-osmolality and hyponatremia: Secondary | ICD-10-CM | POA: Diagnosis not present

## 2023-09-01 DIAGNOSIS — I10 Essential (primary) hypertension: Secondary | ICD-10-CM | POA: Diagnosis not present

## 2023-09-01 DIAGNOSIS — N1832 Chronic kidney disease, stage 3b: Secondary | ICD-10-CM | POA: Diagnosis not present

## 2023-09-01 DIAGNOSIS — E7800 Pure hypercholesterolemia, unspecified: Secondary | ICD-10-CM | POA: Diagnosis not present

## 2023-09-05 DIAGNOSIS — N184 Chronic kidney disease, stage 4 (severe): Secondary | ICD-10-CM | POA: Diagnosis not present

## 2023-09-05 DIAGNOSIS — E11319 Type 2 diabetes mellitus with unspecified diabetic retinopathy without macular edema: Secondary | ICD-10-CM | POA: Diagnosis not present

## 2023-09-05 DIAGNOSIS — I129 Hypertensive chronic kidney disease with stage 1 through stage 4 chronic kidney disease, or unspecified chronic kidney disease: Secondary | ICD-10-CM | POA: Diagnosis not present

## 2023-09-05 DIAGNOSIS — E871 Hypo-osmolality and hyponatremia: Secondary | ICD-10-CM | POA: Diagnosis not present

## 2023-09-05 DIAGNOSIS — K5900 Constipation, unspecified: Secondary | ICD-10-CM | POA: Diagnosis not present

## 2023-09-05 DIAGNOSIS — E1122 Type 2 diabetes mellitus with diabetic chronic kidney disease: Secondary | ICD-10-CM | POA: Diagnosis not present

## 2023-09-05 DIAGNOSIS — E785 Hyperlipidemia, unspecified: Secondary | ICD-10-CM | POA: Diagnosis not present

## 2023-09-05 DIAGNOSIS — E611 Iron deficiency: Secondary | ICD-10-CM | POA: Diagnosis not present

## 2023-09-05 DIAGNOSIS — R339 Retention of urine, unspecified: Secondary | ICD-10-CM | POA: Diagnosis not present

## 2023-09-05 DIAGNOSIS — Z87440 Personal history of urinary (tract) infections: Secondary | ICD-10-CM | POA: Diagnosis not present

## 2023-09-05 DIAGNOSIS — Z7982 Long term (current) use of aspirin: Secondary | ICD-10-CM | POA: Diagnosis not present

## 2023-09-05 DIAGNOSIS — E872 Acidosis, unspecified: Secondary | ICD-10-CM | POA: Diagnosis not present

## 2023-09-05 DIAGNOSIS — E875 Hyperkalemia: Secondary | ICD-10-CM | POA: Diagnosis not present

## 2023-09-05 DIAGNOSIS — S93401D Sprain of unspecified ligament of right ankle, subsequent encounter: Secondary | ICD-10-CM | POA: Diagnosis not present

## 2023-09-05 DIAGNOSIS — D631 Anemia in chronic kidney disease: Secondary | ICD-10-CM | POA: Diagnosis not present

## 2023-09-05 DIAGNOSIS — E8809 Other disorders of plasma-protein metabolism, not elsewhere classified: Secondary | ICD-10-CM | POA: Diagnosis not present

## 2023-09-07 DIAGNOSIS — E1122 Type 2 diabetes mellitus with diabetic chronic kidney disease: Secondary | ICD-10-CM | POA: Diagnosis not present

## 2023-09-07 DIAGNOSIS — E871 Hypo-osmolality and hyponatremia: Secondary | ICD-10-CM | POA: Diagnosis not present

## 2023-09-07 DIAGNOSIS — Z87440 Personal history of urinary (tract) infections: Secondary | ICD-10-CM | POA: Diagnosis not present

## 2023-09-07 DIAGNOSIS — R339 Retention of urine, unspecified: Secondary | ICD-10-CM | POA: Diagnosis not present

## 2023-09-07 DIAGNOSIS — E11319 Type 2 diabetes mellitus with unspecified diabetic retinopathy without macular edema: Secondary | ICD-10-CM | POA: Diagnosis not present

## 2023-09-07 DIAGNOSIS — S93401D Sprain of unspecified ligament of right ankle, subsequent encounter: Secondary | ICD-10-CM | POA: Diagnosis not present

## 2023-09-07 DIAGNOSIS — D631 Anemia in chronic kidney disease: Secondary | ICD-10-CM | POA: Diagnosis not present

## 2023-09-07 DIAGNOSIS — E875 Hyperkalemia: Secondary | ICD-10-CM | POA: Diagnosis not present

## 2023-09-07 DIAGNOSIS — E8809 Other disorders of plasma-protein metabolism, not elsewhere classified: Secondary | ICD-10-CM | POA: Diagnosis not present

## 2023-09-07 DIAGNOSIS — K5900 Constipation, unspecified: Secondary | ICD-10-CM | POA: Diagnosis not present

## 2023-09-07 DIAGNOSIS — E872 Acidosis, unspecified: Secondary | ICD-10-CM | POA: Diagnosis not present

## 2023-09-07 DIAGNOSIS — I129 Hypertensive chronic kidney disease with stage 1 through stage 4 chronic kidney disease, or unspecified chronic kidney disease: Secondary | ICD-10-CM | POA: Diagnosis not present

## 2023-09-07 DIAGNOSIS — E611 Iron deficiency: Secondary | ICD-10-CM | POA: Diagnosis not present

## 2023-09-07 DIAGNOSIS — N184 Chronic kidney disease, stage 4 (severe): Secondary | ICD-10-CM | POA: Diagnosis not present

## 2023-09-07 DIAGNOSIS — E785 Hyperlipidemia, unspecified: Secondary | ICD-10-CM | POA: Diagnosis not present

## 2023-09-07 DIAGNOSIS — Z7982 Long term (current) use of aspirin: Secondary | ICD-10-CM | POA: Diagnosis not present

## 2023-09-09 DIAGNOSIS — Z7982 Long term (current) use of aspirin: Secondary | ICD-10-CM | POA: Diagnosis not present

## 2023-09-09 DIAGNOSIS — R339 Retention of urine, unspecified: Secondary | ICD-10-CM | POA: Diagnosis not present

## 2023-09-09 DIAGNOSIS — E11319 Type 2 diabetes mellitus with unspecified diabetic retinopathy without macular edema: Secondary | ICD-10-CM | POA: Diagnosis not present

## 2023-09-09 DIAGNOSIS — E8809 Other disorders of plasma-protein metabolism, not elsewhere classified: Secondary | ICD-10-CM | POA: Diagnosis not present

## 2023-09-09 DIAGNOSIS — Z87440 Personal history of urinary (tract) infections: Secondary | ICD-10-CM | POA: Diagnosis not present

## 2023-09-09 DIAGNOSIS — S93401D Sprain of unspecified ligament of right ankle, subsequent encounter: Secondary | ICD-10-CM | POA: Diagnosis not present

## 2023-09-09 DIAGNOSIS — E871 Hypo-osmolality and hyponatremia: Secondary | ICD-10-CM | POA: Diagnosis not present

## 2023-09-09 DIAGNOSIS — E1122 Type 2 diabetes mellitus with diabetic chronic kidney disease: Secondary | ICD-10-CM | POA: Diagnosis not present

## 2023-09-09 DIAGNOSIS — K5900 Constipation, unspecified: Secondary | ICD-10-CM | POA: Diagnosis not present

## 2023-09-09 DIAGNOSIS — E872 Acidosis, unspecified: Secondary | ICD-10-CM | POA: Diagnosis not present

## 2023-09-09 DIAGNOSIS — E875 Hyperkalemia: Secondary | ICD-10-CM | POA: Diagnosis not present

## 2023-09-09 DIAGNOSIS — D631 Anemia in chronic kidney disease: Secondary | ICD-10-CM | POA: Diagnosis not present

## 2023-09-09 DIAGNOSIS — N184 Chronic kidney disease, stage 4 (severe): Secondary | ICD-10-CM | POA: Diagnosis not present

## 2023-09-09 DIAGNOSIS — I129 Hypertensive chronic kidney disease with stage 1 through stage 4 chronic kidney disease, or unspecified chronic kidney disease: Secondary | ICD-10-CM | POA: Diagnosis not present

## 2023-09-09 DIAGNOSIS — E611 Iron deficiency: Secondary | ICD-10-CM | POA: Diagnosis not present

## 2023-09-09 DIAGNOSIS — E785 Hyperlipidemia, unspecified: Secondary | ICD-10-CM | POA: Diagnosis not present

## 2023-09-12 DIAGNOSIS — E8809 Other disorders of plasma-protein metabolism, not elsewhere classified: Secondary | ICD-10-CM | POA: Diagnosis not present

## 2023-09-12 DIAGNOSIS — E785 Hyperlipidemia, unspecified: Secondary | ICD-10-CM | POA: Diagnosis not present

## 2023-09-12 DIAGNOSIS — S93401D Sprain of unspecified ligament of right ankle, subsequent encounter: Secondary | ICD-10-CM | POA: Diagnosis not present

## 2023-09-12 DIAGNOSIS — E11319 Type 2 diabetes mellitus with unspecified diabetic retinopathy without macular edema: Secondary | ICD-10-CM | POA: Diagnosis not present

## 2023-09-12 DIAGNOSIS — E871 Hypo-osmolality and hyponatremia: Secondary | ICD-10-CM | POA: Diagnosis not present

## 2023-09-12 DIAGNOSIS — E611 Iron deficiency: Secondary | ICD-10-CM | POA: Diagnosis not present

## 2023-09-12 DIAGNOSIS — E1122 Type 2 diabetes mellitus with diabetic chronic kidney disease: Secondary | ICD-10-CM | POA: Diagnosis not present

## 2023-09-12 DIAGNOSIS — R339 Retention of urine, unspecified: Secondary | ICD-10-CM | POA: Diagnosis not present

## 2023-09-12 DIAGNOSIS — E872 Acidosis, unspecified: Secondary | ICD-10-CM | POA: Diagnosis not present

## 2023-09-12 DIAGNOSIS — Z7982 Long term (current) use of aspirin: Secondary | ICD-10-CM | POA: Diagnosis not present

## 2023-09-12 DIAGNOSIS — N184 Chronic kidney disease, stage 4 (severe): Secondary | ICD-10-CM | POA: Diagnosis not present

## 2023-09-12 DIAGNOSIS — I129 Hypertensive chronic kidney disease with stage 1 through stage 4 chronic kidney disease, or unspecified chronic kidney disease: Secondary | ICD-10-CM | POA: Diagnosis not present

## 2023-09-12 DIAGNOSIS — E875 Hyperkalemia: Secondary | ICD-10-CM | POA: Diagnosis not present

## 2023-09-12 DIAGNOSIS — Z87440 Personal history of urinary (tract) infections: Secondary | ICD-10-CM | POA: Diagnosis not present

## 2023-09-12 DIAGNOSIS — K5900 Constipation, unspecified: Secondary | ICD-10-CM | POA: Diagnosis not present

## 2023-09-12 DIAGNOSIS — D631 Anemia in chronic kidney disease: Secondary | ICD-10-CM | POA: Diagnosis not present

## 2023-09-14 DIAGNOSIS — E11319 Type 2 diabetes mellitus with unspecified diabetic retinopathy without macular edema: Secondary | ICD-10-CM | POA: Diagnosis not present

## 2023-09-14 DIAGNOSIS — E872 Acidosis, unspecified: Secondary | ICD-10-CM | POA: Diagnosis not present

## 2023-09-14 DIAGNOSIS — E1122 Type 2 diabetes mellitus with diabetic chronic kidney disease: Secondary | ICD-10-CM | POA: Diagnosis not present

## 2023-09-14 DIAGNOSIS — S93401D Sprain of unspecified ligament of right ankle, subsequent encounter: Secondary | ICD-10-CM | POA: Diagnosis not present

## 2023-09-14 DIAGNOSIS — E611 Iron deficiency: Secondary | ICD-10-CM | POA: Diagnosis not present

## 2023-09-14 DIAGNOSIS — R339 Retention of urine, unspecified: Secondary | ICD-10-CM | POA: Diagnosis not present

## 2023-09-14 DIAGNOSIS — K5900 Constipation, unspecified: Secondary | ICD-10-CM | POA: Diagnosis not present

## 2023-09-14 DIAGNOSIS — N184 Chronic kidney disease, stage 4 (severe): Secondary | ICD-10-CM | POA: Diagnosis not present

## 2023-09-14 DIAGNOSIS — E785 Hyperlipidemia, unspecified: Secondary | ICD-10-CM | POA: Diagnosis not present

## 2023-09-14 DIAGNOSIS — E871 Hypo-osmolality and hyponatremia: Secondary | ICD-10-CM | POA: Diagnosis not present

## 2023-09-14 DIAGNOSIS — D631 Anemia in chronic kidney disease: Secondary | ICD-10-CM | POA: Diagnosis not present

## 2023-09-14 DIAGNOSIS — E875 Hyperkalemia: Secondary | ICD-10-CM | POA: Diagnosis not present

## 2023-09-14 DIAGNOSIS — Z7982 Long term (current) use of aspirin: Secondary | ICD-10-CM | POA: Diagnosis not present

## 2023-09-14 DIAGNOSIS — Z87440 Personal history of urinary (tract) infections: Secondary | ICD-10-CM | POA: Diagnosis not present

## 2023-09-14 DIAGNOSIS — E8809 Other disorders of plasma-protein metabolism, not elsewhere classified: Secondary | ICD-10-CM | POA: Diagnosis not present

## 2023-09-14 DIAGNOSIS — I129 Hypertensive chronic kidney disease with stage 1 through stage 4 chronic kidney disease, or unspecified chronic kidney disease: Secondary | ICD-10-CM | POA: Diagnosis not present

## 2023-09-15 DIAGNOSIS — E785 Hyperlipidemia, unspecified: Secondary | ICD-10-CM | POA: Diagnosis not present

## 2023-09-15 DIAGNOSIS — E1122 Type 2 diabetes mellitus with diabetic chronic kidney disease: Secondary | ICD-10-CM | POA: Diagnosis not present

## 2023-09-15 DIAGNOSIS — N184 Chronic kidney disease, stage 4 (severe): Secondary | ICD-10-CM | POA: Diagnosis not present

## 2023-09-15 DIAGNOSIS — I129 Hypertensive chronic kidney disease with stage 1 through stage 4 chronic kidney disease, or unspecified chronic kidney disease: Secondary | ICD-10-CM | POA: Diagnosis not present

## 2023-09-15 DIAGNOSIS — E8809 Other disorders of plasma-protein metabolism, not elsewhere classified: Secondary | ICD-10-CM | POA: Diagnosis not present

## 2023-09-15 DIAGNOSIS — E871 Hypo-osmolality and hyponatremia: Secondary | ICD-10-CM | POA: Diagnosis not present

## 2023-09-15 DIAGNOSIS — S93401D Sprain of unspecified ligament of right ankle, subsequent encounter: Secondary | ICD-10-CM | POA: Diagnosis not present

## 2023-09-15 DIAGNOSIS — R339 Retention of urine, unspecified: Secondary | ICD-10-CM | POA: Diagnosis not present

## 2023-09-15 DIAGNOSIS — E11319 Type 2 diabetes mellitus with unspecified diabetic retinopathy without macular edema: Secondary | ICD-10-CM | POA: Diagnosis not present

## 2023-09-15 DIAGNOSIS — D631 Anemia in chronic kidney disease: Secondary | ICD-10-CM | POA: Diagnosis not present

## 2023-09-15 DIAGNOSIS — Z7982 Long term (current) use of aspirin: Secondary | ICD-10-CM | POA: Diagnosis not present

## 2023-09-15 DIAGNOSIS — E875 Hyperkalemia: Secondary | ICD-10-CM | POA: Diagnosis not present

## 2023-09-15 DIAGNOSIS — Z87440 Personal history of urinary (tract) infections: Secondary | ICD-10-CM | POA: Diagnosis not present

## 2023-09-15 DIAGNOSIS — E872 Acidosis, unspecified: Secondary | ICD-10-CM | POA: Diagnosis not present

## 2023-09-15 DIAGNOSIS — E611 Iron deficiency: Secondary | ICD-10-CM | POA: Diagnosis not present

## 2023-09-15 DIAGNOSIS — K5900 Constipation, unspecified: Secondary | ICD-10-CM | POA: Diagnosis not present

## 2023-09-18 DIAGNOSIS — K5900 Constipation, unspecified: Secondary | ICD-10-CM | POA: Diagnosis not present

## 2023-09-18 DIAGNOSIS — E785 Hyperlipidemia, unspecified: Secondary | ICD-10-CM | POA: Diagnosis not present

## 2023-09-18 DIAGNOSIS — E11319 Type 2 diabetes mellitus with unspecified diabetic retinopathy without macular edema: Secondary | ICD-10-CM | POA: Diagnosis not present

## 2023-09-18 DIAGNOSIS — D631 Anemia in chronic kidney disease: Secondary | ICD-10-CM | POA: Diagnosis not present

## 2023-09-18 DIAGNOSIS — S93401D Sprain of unspecified ligament of right ankle, subsequent encounter: Secondary | ICD-10-CM | POA: Diagnosis not present

## 2023-09-18 DIAGNOSIS — R339 Retention of urine, unspecified: Secondary | ICD-10-CM | POA: Diagnosis not present

## 2023-09-18 DIAGNOSIS — I129 Hypertensive chronic kidney disease with stage 1 through stage 4 chronic kidney disease, or unspecified chronic kidney disease: Secondary | ICD-10-CM | POA: Diagnosis not present

## 2023-09-18 DIAGNOSIS — E875 Hyperkalemia: Secondary | ICD-10-CM | POA: Diagnosis not present

## 2023-09-18 DIAGNOSIS — N184 Chronic kidney disease, stage 4 (severe): Secondary | ICD-10-CM | POA: Diagnosis not present

## 2023-09-18 DIAGNOSIS — Z87440 Personal history of urinary (tract) infections: Secondary | ICD-10-CM | POA: Diagnosis not present

## 2023-09-18 DIAGNOSIS — E871 Hypo-osmolality and hyponatremia: Secondary | ICD-10-CM | POA: Diagnosis not present

## 2023-09-18 DIAGNOSIS — E611 Iron deficiency: Secondary | ICD-10-CM | POA: Diagnosis not present

## 2023-09-18 DIAGNOSIS — E8809 Other disorders of plasma-protein metabolism, not elsewhere classified: Secondary | ICD-10-CM | POA: Diagnosis not present

## 2023-09-18 DIAGNOSIS — Z7982 Long term (current) use of aspirin: Secondary | ICD-10-CM | POA: Diagnosis not present

## 2023-09-18 DIAGNOSIS — E872 Acidosis, unspecified: Secondary | ICD-10-CM | POA: Diagnosis not present

## 2023-09-18 DIAGNOSIS — E1122 Type 2 diabetes mellitus with diabetic chronic kidney disease: Secondary | ICD-10-CM | POA: Diagnosis not present

## 2023-09-19 DIAGNOSIS — E872 Acidosis, unspecified: Secondary | ICD-10-CM | POA: Diagnosis not present

## 2023-09-19 DIAGNOSIS — R339 Retention of urine, unspecified: Secondary | ICD-10-CM | POA: Diagnosis not present

## 2023-09-19 DIAGNOSIS — E1122 Type 2 diabetes mellitus with diabetic chronic kidney disease: Secondary | ICD-10-CM | POA: Diagnosis not present

## 2023-09-19 DIAGNOSIS — N184 Chronic kidney disease, stage 4 (severe): Secondary | ICD-10-CM | POA: Diagnosis not present

## 2023-09-19 DIAGNOSIS — E611 Iron deficiency: Secondary | ICD-10-CM | POA: Diagnosis not present

## 2023-09-19 DIAGNOSIS — E875 Hyperkalemia: Secondary | ICD-10-CM | POA: Diagnosis not present

## 2023-09-19 DIAGNOSIS — E871 Hypo-osmolality and hyponatremia: Secondary | ICD-10-CM | POA: Diagnosis not present

## 2023-09-19 DIAGNOSIS — K5900 Constipation, unspecified: Secondary | ICD-10-CM | POA: Diagnosis not present

## 2023-09-19 DIAGNOSIS — I129 Hypertensive chronic kidney disease with stage 1 through stage 4 chronic kidney disease, or unspecified chronic kidney disease: Secondary | ICD-10-CM | POA: Diagnosis not present

## 2023-09-19 DIAGNOSIS — S93401D Sprain of unspecified ligament of right ankle, subsequent encounter: Secondary | ICD-10-CM | POA: Diagnosis not present

## 2023-09-19 DIAGNOSIS — Z7982 Long term (current) use of aspirin: Secondary | ICD-10-CM | POA: Diagnosis not present

## 2023-09-19 DIAGNOSIS — Z87440 Personal history of urinary (tract) infections: Secondary | ICD-10-CM | POA: Diagnosis not present

## 2023-09-19 DIAGNOSIS — E11319 Type 2 diabetes mellitus with unspecified diabetic retinopathy without macular edema: Secondary | ICD-10-CM | POA: Diagnosis not present

## 2023-09-19 DIAGNOSIS — D631 Anemia in chronic kidney disease: Secondary | ICD-10-CM | POA: Diagnosis not present

## 2023-09-19 DIAGNOSIS — E8809 Other disorders of plasma-protein metabolism, not elsewhere classified: Secondary | ICD-10-CM | POA: Diagnosis not present

## 2023-09-19 DIAGNOSIS — E785 Hyperlipidemia, unspecified: Secondary | ICD-10-CM | POA: Diagnosis not present

## 2023-09-21 DIAGNOSIS — E875 Hyperkalemia: Secondary | ICD-10-CM | POA: Diagnosis not present

## 2023-09-21 DIAGNOSIS — I129 Hypertensive chronic kidney disease with stage 1 through stage 4 chronic kidney disease, or unspecified chronic kidney disease: Secondary | ICD-10-CM | POA: Diagnosis not present

## 2023-09-21 DIAGNOSIS — R339 Retention of urine, unspecified: Secondary | ICD-10-CM | POA: Diagnosis not present

## 2023-09-21 DIAGNOSIS — K5900 Constipation, unspecified: Secondary | ICD-10-CM | POA: Diagnosis not present

## 2023-09-21 DIAGNOSIS — E1122 Type 2 diabetes mellitus with diabetic chronic kidney disease: Secondary | ICD-10-CM | POA: Diagnosis not present

## 2023-09-21 DIAGNOSIS — E785 Hyperlipidemia, unspecified: Secondary | ICD-10-CM | POA: Diagnosis not present

## 2023-09-21 DIAGNOSIS — Z87440 Personal history of urinary (tract) infections: Secondary | ICD-10-CM | POA: Diagnosis not present

## 2023-09-21 DIAGNOSIS — D631 Anemia in chronic kidney disease: Secondary | ICD-10-CM | POA: Diagnosis not present

## 2023-09-21 DIAGNOSIS — S93401D Sprain of unspecified ligament of right ankle, subsequent encounter: Secondary | ICD-10-CM | POA: Diagnosis not present

## 2023-09-21 DIAGNOSIS — Z7982 Long term (current) use of aspirin: Secondary | ICD-10-CM | POA: Diagnosis not present

## 2023-09-21 DIAGNOSIS — E611 Iron deficiency: Secondary | ICD-10-CM | POA: Diagnosis not present

## 2023-09-21 DIAGNOSIS — E8809 Other disorders of plasma-protein metabolism, not elsewhere classified: Secondary | ICD-10-CM | POA: Diagnosis not present

## 2023-09-21 DIAGNOSIS — E872 Acidosis, unspecified: Secondary | ICD-10-CM | POA: Diagnosis not present

## 2023-09-21 DIAGNOSIS — E871 Hypo-osmolality and hyponatremia: Secondary | ICD-10-CM | POA: Diagnosis not present

## 2023-09-21 DIAGNOSIS — N184 Chronic kidney disease, stage 4 (severe): Secondary | ICD-10-CM | POA: Diagnosis not present

## 2023-09-21 DIAGNOSIS — E11319 Type 2 diabetes mellitus with unspecified diabetic retinopathy without macular edema: Secondary | ICD-10-CM | POA: Diagnosis not present

## 2023-09-26 DIAGNOSIS — E871 Hypo-osmolality and hyponatremia: Secondary | ICD-10-CM | POA: Diagnosis not present

## 2023-09-26 DIAGNOSIS — S93401D Sprain of unspecified ligament of right ankle, subsequent encounter: Secondary | ICD-10-CM | POA: Diagnosis not present

## 2023-09-26 DIAGNOSIS — E875 Hyperkalemia: Secondary | ICD-10-CM | POA: Diagnosis not present

## 2023-09-26 DIAGNOSIS — D631 Anemia in chronic kidney disease: Secondary | ICD-10-CM | POA: Diagnosis not present

## 2023-09-26 DIAGNOSIS — E8809 Other disorders of plasma-protein metabolism, not elsewhere classified: Secondary | ICD-10-CM | POA: Diagnosis not present

## 2023-09-26 DIAGNOSIS — E11319 Type 2 diabetes mellitus with unspecified diabetic retinopathy without macular edema: Secondary | ICD-10-CM | POA: Diagnosis not present

## 2023-09-26 DIAGNOSIS — I129 Hypertensive chronic kidney disease with stage 1 through stage 4 chronic kidney disease, or unspecified chronic kidney disease: Secondary | ICD-10-CM | POA: Diagnosis not present

## 2023-09-26 DIAGNOSIS — R339 Retention of urine, unspecified: Secondary | ICD-10-CM | POA: Diagnosis not present

## 2023-09-26 DIAGNOSIS — Z87440 Personal history of urinary (tract) infections: Secondary | ICD-10-CM | POA: Diagnosis not present

## 2023-09-26 DIAGNOSIS — E872 Acidosis, unspecified: Secondary | ICD-10-CM | POA: Diagnosis not present

## 2023-09-26 DIAGNOSIS — E785 Hyperlipidemia, unspecified: Secondary | ICD-10-CM | POA: Diagnosis not present

## 2023-09-26 DIAGNOSIS — K5900 Constipation, unspecified: Secondary | ICD-10-CM | POA: Diagnosis not present

## 2023-09-26 DIAGNOSIS — Z7982 Long term (current) use of aspirin: Secondary | ICD-10-CM | POA: Diagnosis not present

## 2023-09-26 DIAGNOSIS — N184 Chronic kidney disease, stage 4 (severe): Secondary | ICD-10-CM | POA: Diagnosis not present

## 2023-09-26 DIAGNOSIS — E611 Iron deficiency: Secondary | ICD-10-CM | POA: Diagnosis not present

## 2023-09-26 DIAGNOSIS — E1122 Type 2 diabetes mellitus with diabetic chronic kidney disease: Secondary | ICD-10-CM | POA: Diagnosis not present

## 2023-09-29 DIAGNOSIS — N184 Chronic kidney disease, stage 4 (severe): Secondary | ICD-10-CM | POA: Diagnosis not present

## 2023-09-29 DIAGNOSIS — E611 Iron deficiency: Secondary | ICD-10-CM | POA: Diagnosis not present

## 2023-09-29 DIAGNOSIS — I129 Hypertensive chronic kidney disease with stage 1 through stage 4 chronic kidney disease, or unspecified chronic kidney disease: Secondary | ICD-10-CM | POA: Diagnosis not present

## 2023-09-29 DIAGNOSIS — K5900 Constipation, unspecified: Secondary | ICD-10-CM | POA: Diagnosis not present

## 2023-09-29 DIAGNOSIS — D631 Anemia in chronic kidney disease: Secondary | ICD-10-CM | POA: Diagnosis not present

## 2023-09-29 DIAGNOSIS — E875 Hyperkalemia: Secondary | ICD-10-CM | POA: Diagnosis not present

## 2023-09-29 DIAGNOSIS — E1122 Type 2 diabetes mellitus with diabetic chronic kidney disease: Secondary | ICD-10-CM | POA: Diagnosis not present

## 2023-09-29 DIAGNOSIS — E11319 Type 2 diabetes mellitus with unspecified diabetic retinopathy without macular edema: Secondary | ICD-10-CM | POA: Diagnosis not present

## 2023-09-29 DIAGNOSIS — S93401D Sprain of unspecified ligament of right ankle, subsequent encounter: Secondary | ICD-10-CM | POA: Diagnosis not present

## 2023-09-29 DIAGNOSIS — E871 Hypo-osmolality and hyponatremia: Secondary | ICD-10-CM | POA: Diagnosis not present

## 2023-09-29 DIAGNOSIS — Z7982 Long term (current) use of aspirin: Secondary | ICD-10-CM | POA: Diagnosis not present

## 2023-09-29 DIAGNOSIS — R339 Retention of urine, unspecified: Secondary | ICD-10-CM | POA: Diagnosis not present

## 2023-09-29 DIAGNOSIS — E785 Hyperlipidemia, unspecified: Secondary | ICD-10-CM | POA: Diagnosis not present

## 2023-09-29 DIAGNOSIS — E8809 Other disorders of plasma-protein metabolism, not elsewhere classified: Secondary | ICD-10-CM | POA: Diagnosis not present

## 2023-09-29 DIAGNOSIS — Z87440 Personal history of urinary (tract) infections: Secondary | ICD-10-CM | POA: Diagnosis not present

## 2023-09-29 DIAGNOSIS — E872 Acidosis, unspecified: Secondary | ICD-10-CM | POA: Diagnosis not present

## 2023-10-02 ENCOUNTER — Inpatient Hospital Stay (HOSPITAL_COMMUNITY)
Admission: EM | Admit: 2023-10-02 | Discharge: 2023-10-15 | DRG: 689 | Disposition: A | Discharge status: Home Health | Payer: Medicare Other | Attending: Internal Medicine | Admitting: Internal Medicine

## 2023-10-02 ENCOUNTER — Other Ambulatory Visit: Payer: Self-pay

## 2023-10-02 ENCOUNTER — Encounter (HOSPITAL_COMMUNITY): Payer: Self-pay | Admitting: Emergency Medicine

## 2023-10-02 DIAGNOSIS — T68XXXA Hypothermia, initial encounter: Secondary | ICD-10-CM | POA: Diagnosis not present

## 2023-10-02 DIAGNOSIS — I10 Essential (primary) hypertension: Secondary | ICD-10-CM | POA: Diagnosis present

## 2023-10-02 DIAGNOSIS — Z1611 Resistance to penicillins: Secondary | ICD-10-CM | POA: Diagnosis not present

## 2023-10-02 DIAGNOSIS — E875 Hyperkalemia: Secondary | ICD-10-CM | POA: Diagnosis not present

## 2023-10-02 DIAGNOSIS — R0902 Hypoxemia: Secondary | ICD-10-CM | POA: Diagnosis not present

## 2023-10-02 DIAGNOSIS — N390 Urinary tract infection, site not specified: Secondary | ICD-10-CM | POA: Diagnosis not present

## 2023-10-02 DIAGNOSIS — R338 Other retention of urine: Secondary | ICD-10-CM

## 2023-10-02 DIAGNOSIS — R54 Age-related physical debility: Secondary | ICD-10-CM | POA: Diagnosis present

## 2023-10-02 DIAGNOSIS — Z79899 Other long term (current) drug therapy: Secondary | ICD-10-CM | POA: Diagnosis not present

## 2023-10-02 DIAGNOSIS — E1122 Type 2 diabetes mellitus with diabetic chronic kidney disease: Secondary | ICD-10-CM | POA: Diagnosis present

## 2023-10-02 DIAGNOSIS — N179 Acute kidney failure, unspecified: Secondary | ICD-10-CM | POA: Diagnosis not present

## 2023-10-02 DIAGNOSIS — G9341 Metabolic encephalopathy: Secondary | ICD-10-CM

## 2023-10-02 DIAGNOSIS — E785 Hyperlipidemia, unspecified: Secondary | ICD-10-CM | POA: Diagnosis not present

## 2023-10-02 DIAGNOSIS — R339 Retention of urine, unspecified: Secondary | ICD-10-CM | POA: Diagnosis not present

## 2023-10-02 DIAGNOSIS — B961 Klebsiella pneumoniae [K. pneumoniae] as the cause of diseases classified elsewhere: Secondary | ICD-10-CM | POA: Diagnosis present

## 2023-10-02 DIAGNOSIS — E8770 Fluid overload, unspecified: Secondary | ICD-10-CM | POA: Diagnosis not present

## 2023-10-02 DIAGNOSIS — R5381 Other malaise: Secondary | ICD-10-CM | POA: Diagnosis not present

## 2023-10-02 DIAGNOSIS — Z7982 Long term (current) use of aspirin: Secondary | ICD-10-CM | POA: Diagnosis not present

## 2023-10-02 DIAGNOSIS — N3000 Acute cystitis without hematuria: Principal | ICD-10-CM | POA: Diagnosis present

## 2023-10-02 DIAGNOSIS — Z792 Long term (current) use of antibiotics: Secondary | ICD-10-CM | POA: Diagnosis not present

## 2023-10-02 DIAGNOSIS — E222 Syndrome of inappropriate secretion of antidiuretic hormone: Secondary | ICD-10-CM | POA: Diagnosis not present

## 2023-10-02 DIAGNOSIS — L89626 Pressure-induced deep tissue damage of left heel: Secondary | ICD-10-CM | POA: Diagnosis present

## 2023-10-02 DIAGNOSIS — Z87440 Personal history of urinary (tract) infections: Secondary | ICD-10-CM

## 2023-10-02 DIAGNOSIS — Z888 Allergy status to other drugs, medicaments and biological substances status: Secondary | ICD-10-CM | POA: Diagnosis not present

## 2023-10-02 DIAGNOSIS — N3946 Mixed incontinence: Secondary | ICD-10-CM | POA: Diagnosis present

## 2023-10-02 DIAGNOSIS — E0781 Sick-euthyroid syndrome: Secondary | ICD-10-CM | POA: Diagnosis not present

## 2023-10-02 DIAGNOSIS — I129 Hypertensive chronic kidney disease with stage 1 through stage 4 chronic kidney disease, or unspecified chronic kidney disease: Secondary | ICD-10-CM | POA: Diagnosis present

## 2023-10-02 DIAGNOSIS — R680 Hypothermia, not associated with low environmental temperature: Secondary | ICD-10-CM | POA: Diagnosis not present

## 2023-10-02 DIAGNOSIS — E871 Hypo-osmolality and hyponatremia: Secondary | ICD-10-CM | POA: Diagnosis present

## 2023-10-02 DIAGNOSIS — Z743 Need for continuous supervision: Secondary | ICD-10-CM | POA: Diagnosis not present

## 2023-10-02 DIAGNOSIS — N1832 Chronic kidney disease, stage 3b: Secondary | ICD-10-CM | POA: Diagnosis not present

## 2023-10-02 DIAGNOSIS — F05 Delirium due to known physiological condition: Secondary | ICD-10-CM | POA: Diagnosis not present

## 2023-10-02 LAB — CBC WITH DIFFERENTIAL/PLATELET
Abs Immature Granulocytes: 0.03 10*3/uL (ref 0.00–0.07)
Basophils Absolute: 0 10*3/uL (ref 0.0–0.1)
Basophils Relative: 0 %
Eosinophils Absolute: 0.1 10*3/uL (ref 0.0–0.5)
Eosinophils Relative: 1 %
HCT: 33.1 % — ABNORMAL LOW (ref 36.0–46.0)
Hemoglobin: 11.3 g/dL — ABNORMAL LOW (ref 12.0–15.0)
Immature Granulocytes: 0 %
Lymphocytes Relative: 33 %
Lymphs Abs: 2.8 10*3/uL (ref 0.7–4.0)
MCH: 29.5 pg (ref 26.0–34.0)
MCHC: 34.1 g/dL (ref 30.0–36.0)
MCV: 86.4 fL (ref 80.0–100.0)
Monocytes Absolute: 0.6 10*3/uL (ref 0.1–1.0)
Monocytes Relative: 7 %
Neutro Abs: 5 10*3/uL (ref 1.7–7.7)
Neutrophils Relative %: 59 %
Platelets: 217 10*3/uL (ref 150–400)
RBC: 3.83 MIL/uL — ABNORMAL LOW (ref 3.87–5.11)
RDW: 15.3 % (ref 11.5–15.5)
WBC: 8.5 10*3/uL (ref 4.0–10.5)
nRBC: 0 % (ref 0.0–0.2)

## 2023-10-02 LAB — URINALYSIS, W/ REFLEX TO CULTURE (INFECTION SUSPECTED)
Bilirubin Urine: NEGATIVE
Glucose, UA: NEGATIVE mg/dL
Hgb urine dipstick: NEGATIVE
Ketones, ur: NEGATIVE mg/dL
Nitrite: NEGATIVE
Protein, ur: NEGATIVE mg/dL
Specific Gravity, Urine: 1.004 — ABNORMAL LOW (ref 1.005–1.030)
WBC, UA: >50 WBC/hpf (ref 0–5)
pH: 6 (ref 5.0–8.0)

## 2023-10-02 LAB — BASIC METABOLIC PANEL
Anion gap: 6 (ref 5–15)
BUN: 11 mg/dL (ref 8–23)
CO2: 24 mmol/L (ref 22–32)
Calcium: 9.7 mg/dL (ref 8.9–10.3)
Chloride: 98 mmol/L (ref 98–111)
Creatinine, Ser: 1.14 mg/dL — ABNORMAL HIGH (ref 0.44–1.00)
GFR, Estimated: 45 mL/min — ABNORMAL LOW (ref >60)
Glucose, Bld: 113 mg/dL — ABNORMAL HIGH (ref 70–99)
Potassium: 4.2 mmol/L (ref 3.5–5.1)
Sodium: 128 mmol/L — ABNORMAL LOW (ref 135–145)

## 2023-10-02 MED ORDER — SODIUM CHLORIDE 0.9 % IV SOLN: INTRAVENOUS | Status: AC

## 2023-10-02 MED ORDER — ALBUTEROL SULFATE (2.5 MG/3ML) 0.083% IN NEBU: 2.5000 mg | INHALATION_SOLUTION | RESPIRATORY_TRACT | Status: DC | PRN

## 2023-10-02 MED ORDER — SIMVASTATIN 20 MG PO TABS
20.0000 mg | ORAL_TABLET | Freq: Every evening | ORAL | Status: DC
  Administered 2023-10-02 – 2023-10-09 (×8): 20 mg via ORAL
  Filled 2023-10-02 (×8): qty 1

## 2023-10-02 MED ORDER — LORATADINE 10 MG PO TABS: 10.0000 mg | ORAL_TABLET | Freq: Every day | ORAL | Status: DC | PRN

## 2023-10-02 MED ORDER — SODIUM CHLORIDE 0.9 % IV SOLN: INTRAVENOUS | Status: DC

## 2023-10-02 MED ORDER — VITAMIN C 500 MG PO TABS
1000.0000 mg | ORAL_TABLET | Freq: Every day | ORAL | Status: DC
  Administered 2023-10-03 – 2023-10-15 (×12): 1000 mg via ORAL
  Filled 2023-10-02 (×13): qty 2

## 2023-10-02 MED ORDER — SODIUM CHLORIDE 0.9 % IV SOLN
1.0000 g | INTRAVENOUS | Status: AC
  Administered 2023-10-02 – 2023-10-11 (×10): 1 g via INTRAVENOUS
  Filled 2023-10-02 (×10): qty 10

## 2023-10-02 MED ORDER — CEFTRIAXONE SODIUM 1 G IJ SOLR: 1.0000 g | INTRAMUSCULAR | Status: DC

## 2023-10-02 MED ORDER — ENOXAPARIN SODIUM 40 MG/0.4ML IJ SOSY
40.0000 mg | PREFILLED_SYRINGE | INTRAMUSCULAR | Status: DC
  Administered 2023-10-02 – 2023-10-08 (×7): 40 mg via SUBCUTANEOUS
  Filled 2023-10-02 (×7): qty 0.4

## 2023-10-02 MED ORDER — TRAZODONE HCL 50 MG PO TABS
25.0000 mg | ORAL_TABLET | Freq: Every evening | ORAL | Status: DC | PRN
  Administered 2023-10-03 – 2023-10-11 (×5): 25 mg via ORAL
  Filled 2023-10-02 (×6): qty 1

## 2023-10-02 MED ORDER — ACETAMINOPHEN 325 MG PO TABS
650.0000 mg | ORAL_TABLET | Freq: Four times a day (QID) | ORAL | Status: DC | PRN
  Administered 2023-10-02 – 2023-10-10 (×4): 650 mg via ORAL
  Filled 2023-10-02 (×5): qty 2

## 2023-10-02 MED ORDER — ADULT MULTIVITAMIN W/MINERALS CH
1.0000 | ORAL_TABLET | Freq: Every day | ORAL | Status: DC
  Administered 2023-10-03 – 2023-10-15 (×12): 1 via ORAL
  Filled 2023-10-02 (×13): qty 1

## 2023-10-02 MED ORDER — ONDANSETRON HCL 4 MG PO TABS: 4.0000 mg | ORAL_TABLET | Freq: Four times a day (QID) | ORAL | Status: DC | PRN

## 2023-10-02 MED ORDER — ASPIRIN 81 MG PO TBEC
81.0000 mg | DELAYED_RELEASE_TABLET | ORAL | Status: DC
  Administered 2023-10-02 – 2023-10-13 (×6): 81 mg via ORAL
  Filled 2023-10-02 (×6): qty 1

## 2023-10-02 MED ORDER — ONDANSETRON HCL 4 MG/2ML IJ SOLN: 4.0000 mg | Freq: Four times a day (QID) | INTRAMUSCULAR | Status: DC | PRN

## 2023-10-02 MED ORDER — ACETAMINOPHEN 650 MG RE SUPP: 650.0000 mg | Freq: Four times a day (QID) | RECTAL | Status: DC | PRN

## 2023-10-02 NOTE — ED Triage Notes (Signed)
Pt BIB [facility] from home. Per [facility] pt had lab work done. MD office contacted family to report to ED for severe UTI.  BP:152/88 P:80 R:14 SPO2: 90%RA

## 2023-10-02 NOTE — ED Provider Notes (Signed)
[place] EMERGENCY DEPARTMENT AT [facility] Provider Note   CSN: [id number] Arrival date & time: 10/02/23  1208     History  Chief Complaint  Patient presents with   Abdominal Pain    Per EMS Pt. Had lab work done and has a current UTI    Joanne Patterson is a 89 y.o. female.  89 year old female presents with UTI.  Cording to the daughter who is at bedside a urine was collected about a week ago and culture reports came back today and show positive infection.  Daughter states that patient has been more confused as well as more lethargic.  Denies any fever, emesis.  Patient denies any flank pain.  No respiratory symptoms.  No new medications.       Home Medications Prior to Admission medications   Medication Sig Start Date End Date Taking? Authorizing Provider  amLODipine (NORVASC) 10 MG tablet Take 10 mg by mouth daily.    [provider]  Ascorbic Acid (VITAMIN C) 1000 MG tablet Take 1,000 mg by mouth daily.    [provider]  aspirin EC 81 MG tablet Take 81 mg by mouth every Monday, Wednesday, and Friday. Swallow whole.    [provider]  furosemide (LASIX) 20 MG tablet Take 0.5 tablets (10 mg total) by mouth daily. 08/28/23   Marguerita Merles Latif, DO  loratadine (CLARITIN) 10 MG tablet Take 10 mg by mouth daily as needed for allergies or rhinitis.    [provider]  Multiple Vitamins-Minerals (CENTRUM SILVER ULTRA WOMENS) TABS Take 1 tablet by mouth daily with breakfast.    [provider]  olmesartan (BENICAR) 40 MG tablet Take 1 tablet (40 mg total) by mouth daily. 08/27/23 08/26/24  Marguerita Merles Latif, DO  Omega-3 Fatty Acids (FISH OIL) 1000 MG CAPS Take 1,000 mg by mouth daily.    [provider]  PATADAY 0.2 % SOLN Place 1 drop into both eyes 2 (two) times daily.    [provider]  polyvinyl alcohol (LIQUIFILM TEARS) 1.4 % ophthalmic solution Place 1 drop into both eyes as needed for dry eyes.  08/27/23   Sheikh, Omair Latif, DO  REFRESH TEARS PF 0.5-0.9 % SOLN Place 1 drop into both eyes daily.    [provider]  senna (SENOKOT) 8.6 MG TABS tablet Take 2 tablets (17.2 mg total) by mouth daily. 08/28/23   Marguerita Merles Latif, DO  simvastatin (ZOCOR) 20 MG tablet Take 1 tablet (20 mg total) by mouth every evening. 09/03/23   Sheikh, Omair Latif, DO  TYLENOL 500 MG tablet Take 500-1,000 mg by mouth See admin instructions. Take 1,000 mg by mouth in the morning and an additional 500-1,000 mg by mouth once a day as needed for pain    [provider]      Allergies    Diphenhydramine    Review of Systems   Review of Systems  All other systems reviewed and are negative.   Physical Exam Updated Vital Signs BP (!) 165/67 (BP Location: Left Arm)   Pulse 65   Temp (!) 97.3 F (36.3 C) (Oral)   Resp 16   Ht 1.676 m (5\' 6" )   Wt 85.7 kg   SpO2 100%   BMI 30.51 kg/m  Physical Exam Vitals and nursing note reviewed.  Constitutional:      General: She is not in acute distress.    Appearance: Normal appearance. She is well-developed. She is not toxic-appearing.  HENT:  Head: Normocephalic and atraumatic.  Eyes:     General: Lids are normal.     Conjunctiva/sclera: Conjunctivae normal.     Pupils: Pupils are equal, round, and reactive to light.  Neck:     Thyroid: No thyroid mass.     Trachea: No tracheal deviation.  Cardiovascular:     Rate and Rhythm: Normal rate and regular rhythm.     Heart sounds: Normal heart sounds. No murmur heard.    No gallop.  Pulmonary:     Effort: Pulmonary effort is normal. No respiratory distress.     Breath sounds: Normal breath sounds. No stridor. No decreased breath sounds, wheezing, rhonchi or rales.  Abdominal:     General: There is no distension.     Palpations: Abdomen is soft.     Tenderness: There is no abdominal tenderness. There is no rebound.  Musculoskeletal:        General: No tenderness. Normal range of  motion.     Cervical back: Normal range of motion and neck supple.  Skin:    General: Skin is warm and dry.     Findings: No abrasion or rash.  Neurological:     General: No focal deficit present.     Mental Status: She is alert and oriented to person, place, and time. Mental status is at baseline.     GCS: GCS eye subscore is 4. GCS verbal subscore is 5. GCS motor subscore is 6.     Cranial Nerves: Cranial nerves are intact. No cranial nerve deficit.     Sensory: No sensory deficit.     Motor: Motor function is intact.  Psychiatric:        Attention and Perception: Attention normal.        Speech: Speech normal.        Behavior: Behavior normal.     ED Results / Procedures / Treatments   Labs (all labs ordered are listed, but only abnormal results are displayed) Labs Reviewed - No data to display  EKG None  Radiology No results found.  Procedures Procedures    Medications Ordered in ED Medications - No data to display  ED Course/ Medical Decision Making/ A&P                                 Medical Decision Making Amount and/or Complexity of Data Reviewed Labs: ordered.  Risk Prescription drug management.   Patient with urinalysis that shows infection here.  Given Rocephin.  Patient also with hyponatremia with with sodium of 128.  Patient started on gentle fluids with 150 cc of normal saline.  Recent admission for hyponatremia.  Daughter at bedside states that patient has been more confused.  Likely combination of both of these things.  Patient will require admission will consult hospitalist team        Final Clinical Impression(s) / ED Diagnoses Final diagnoses:  None    Rx / DC Orders ED Discharge Orders     None         Lorre Nick, MD 10/02/23 1620

## 2023-10-02 NOTE — H&P (Signed)
History and Physical  Joanne Patterson ZOX:[id number] DOB: 1927/12/17 DOA: 10/02/2023  PCP: Jarrett Soho, PA-C   Chief Complaint: Lethargy, weakness  HPI: Joanne Patterson is a 89 y.o. female with medical history significant for hypertension, diet-controlled type 2 diabetes, hyperlipidemia, recurrent UTI and recent hospitalization for hyponatremia being admitted to the hospital with UTI.  History is provided by the patient's daughter, who states that she is overall doing well since she was discharged from [facility] 08/27/2023.  Overall she has been a little weak, but trying to ambulate at home with home health.  Couple of weeks ago, she started to get more weak, and intermittently seem to be slightly confused.  No nausea, vomiting, fevers or chills.  After daughter raised some concerns, urine culture was obtained on 11/19, and today she was contacted to bring her mother to the emergency department because urine culture was positive for urinary tract infection.  Workup in the emergency department is significant for sodium of 128, but otherwise unremarkable.  Repeat urine culture was obtained, and patient was started on empiric IV Rocephin.  Review of Systems: Please see HPI for pertinent positives and negatives. A complete 10 system review of systems are otherwise negative.  History reviewed. No pertinent past medical history. History reviewed. No pertinent surgical history.  Social History:  reports that she has never smoked. She has never used smokeless tobacco. No history on file for alcohol use and drug use.   Allergies  Allergen Reactions   Diphenhydramine Other (See Comments)    Hallucinations, sleepiness    History reviewed. No pertinent family history.   Prior to Admission medications   Medication Sig Start Date End Date Taking? Authorizing Provider  amLODipine (NORVASC) 10 MG tablet Take 10 mg by mouth daily.    [provider]  Ascorbic Acid (VITAMIN C)  1000 MG tablet Take 1,000 mg by mouth daily.    [provider]  aspirin EC 81 MG tablet Take 81 mg by mouth every Monday, Wednesday, and Friday. Swallow whole.    [provider]  furosemide (LASIX) 20 MG tablet Take 0.5 tablets (10 mg total) by mouth daily. 08/28/23   Marguerita Merles Latif, DO  loratadine (CLARITIN) 10 MG tablet Take 10 mg by mouth daily as needed for allergies or rhinitis.    [provider]  Multiple Vitamins-Minerals (CENTRUM SILVER ULTRA WOMENS) TABS Take 1 tablet by mouth daily with breakfast.    [provider]  olmesartan (BENICAR) 40 MG tablet Take 1 tablet (40 mg total) by mouth daily. 08/27/23 08/26/24  Marguerita Merles Latif, DO  Omega-3 Fatty Acids (FISH OIL) 1000 MG CAPS Take 1,000 mg by mouth daily.    [provider]  PATADAY 0.2 % SOLN Place 1 drop into both eyes 2 (two) times daily.    [provider]  polyvinyl alcohol (LIQUIFILM TEARS) 1.4 % ophthalmic solution Place 1 drop into both eyes as needed for dry eyes. 08/27/23   Sheikh, Omair Latif, DO  REFRESH TEARS PF 0.5-0.9 % SOLN Place 1 drop into both eyes daily.    [provider]  senna (SENOKOT) 8.6 MG TABS tablet Take 2 tablets (17.2 mg total) by mouth daily. 08/28/23   Marguerita Merles Latif, DO  simvastatin (ZOCOR) 20 MG tablet Take 1 tablet (20 mg total) by mouth every evening. 09/03/23   Sheikh, Omair Latif, DO  TYLENOL 500 MG tablet Take 500-1,000 mg by mouth See admin instructions. Take 1,000 mg by mouth in  the morning and an additional 500-1,000 mg by mouth once a day as needed for pain    [provider]    Physical Exam: BP (!) 154/58 (BP Location: Right Arm)   Pulse (!) 58   Temp (!) 97.4 F (36.3 C)   Resp 18   Ht 5\' 6"  (1.676 m)   Wt 85.7 kg   SpO2 100%   BMI 30.51 kg/m   General:  Alert, oriented, calm, in no acute distress, elderly female appearing younger than her stated age.  Pleasant and cooperative, able to answer  some questions appropriately.  Her daughter is at the bedside. Cardiovascular: RRR, no murmurs or rubs, no peripheral edema  Respiratory: clear to auscultation bilaterally, no wheezes, no crackles  Abdomen: soft, nontender, nondistended, normal bowel tones heard  Skin: dry, no rashes  Musculoskeletal: no joint effusions, normal range of motion  Psychiatric: appropriate affect, normal speech  Neurologic: extraocular muscles intact, clear speech, moving all extremities with intact sensorium         Labs on Admission:  Basic Metabolic Panel: Recent Labs  Lab 10/02/23 1324  NA 128*  K 4.2  CL 98  CO2 24  GLUCOSE 113*  BUN 11  CREATININE 1.14*  CALCIUM 9.7   Liver Function Tests: No results for input(s): "AST", "ALT", "ALKPHOS", "BILITOT", "PROT", "ALBUMIN" in the last 168 hours. No results for input(s): "LIPASE", "AMYLASE" in the last 168 hours. No results for input(s): "AMMONIA" in the last 168 hours. CBC: Recent Labs  Lab 10/02/23 1324  WBC 8.5  NEUTROABS 5.0  HGB 11.3*  HCT 33.1*  MCV 86.4  PLT 217   Cardiac Enzymes: No results for input(s): "CKTOTAL", "CKMB", "CKMBINDEX", "TROPONINI" in the last 168 hours.  BNP (last 3 results) Recent Labs    08/18/23 0837  BNP 56.6    ProBNP (last 3 results) No results for input(s): "PROBNP" in the last 8760 hours.  CBG: No results for input(s): "GLUCAP" in the last 168 hours.  Radiological Exams on Admission: No results found.  Assessment/Plan Joanne Patterson is a 89 y.o. female with medical history significant for hypertension, diet-controlled type 2 diabetes, hyperlipidemia, recurrent UTI and recent hospitalization for hyponatremia being admitted to the hospital with UTI.   UTI-with reported positive blood culture as an outpatient on 11/19, though blood culture report is not available to me.  I was able to see progress notes from today's date from [place] at [facility], but unable to see any further description.   Unknown organism and sensitivities.  Patient is stable, not septic. -Observation admission -Follow-up urine cultures -Continue empiric IV Rocephin  Hyponatremia-previously admitted with severe symptomatic hyponatremia of 120, today sodium is slightly reduced from most recent baseline to 128.  May be some mild symptomatology but could also be related to UTI as above.  During her last hospital stay, her ARB and HCTZ was discontinued.  She who was discharged home on a low-dose of oral Lasix, and since that time it seems her sodium has been stable. -Discontinue Lasix 10 mg p.o. daily -Give gentle IV fluids overnight -Trend sodium with morning labs  Type 2 diabetes-diet controlled  Allergies-Claritin as needed  Hyperlipidemia-Zocor  DVT prophylaxis: Lovenox     Code Status: Full Code  Consults called: None  Admission status: Observation  Time spent: 59 minutes  Migel Hannis Sharlette Dense MD [facility] Pager [phone]  If 7PM-7AM, please contact night-coverage [email or web address] Password [facility]  10/02/2023, 4:51 PM

## 2023-10-02 NOTE — ED Notes (Signed)
ED TO INPATIENT HANDOFF REPORT  Name/Age/Gender Joanne Patterson 89 y.o. female  Code Status    Code Status Orders  (From admission, onward)           Start     Ordered   10/02/23 1650  Full code  Continuous       Question:  By:  Answer:  Consent: discussion documented in EHR   10/02/23 1650           Code Status History     Date Active Date Inactive Code Status Order ID Comments User Context   08/16/2023 1650 08/27/2023 2127 Full Code [id number]  Dorcas Carrow, MD ED       Home/SNF/Other Home  Chief Complaint UTI (urinary tract infection) [N39.0]  Level of Care/Admitting Diagnosis ED Disposition     ED Disposition  Admit   Condition  --   Comment  Hospital Area: [facility] [[id number]]  Level of Care: Med-Surg [16]  May place patient in observation at [facility] or [facility] if equivalent level of care is available:: Yes  Covid Evaluation: Asymptomatic - no recent exposure (last 10 days) testing not required  Diagnosis: UTI (urinary tract infection) [409811]  Admitting Physician: Maryln Gottron [[id number]]  Attending Physician: Kirby Crigler, MIR Jaxson.Roy [[id number]]          Medical History History reviewed. No pertinent past medical history.  Allergies Allergies  Allergen Reactions   Diphenhydramine Other (See Comments)    Hallucinations, sleepiness    IV Location/Drains/Wounds Patient Lines/Drains/Airways Status     Active Line/Drains/Airways     Name Placement date Placement time Site Days   Peripheral IV 10/02/23 20 G Left Antecubital 10/02/23  1616  Antecubital  less than 1            Labs/Imaging Results for orders placed or performed during the hospital encounter of 10/02/23 (from the past 48 hour(s))  CBC with Differential/Platelet     Status: Abnormal   Collection Time: 10/02/23  1:24 PM  Result Value Ref Range   WBC 8.5 4.0 - 10.5 K/uL   RBC 3.83 (L) 3.87 - 5.11 MIL/uL   Hemoglobin 11.3 (L) 12.0 - 15.0 g/dL    HCT 65.7 (L) 84.6 - 46.0 %   MCV 86.4 80.0 - 100.0 fL   MCH 29.5 26.0 - 34.0 pg   MCHC 34.1 30.0 - 36.0 g/dL   RDW 96.2 95.2 - 84.1 %   Platelets 217 150 - 400 K/uL   nRBC 0.0 0.0 - 0.2 %   Neutrophils Relative % 59 %   Neutro Abs 5.0 1.7 - 7.7 K/uL   Lymphocytes Relative 33 %   Lymphs Abs 2.8 0.7 - 4.0 K/uL   Monocytes Relative 7 %   Monocytes Absolute 0.6 0.1 - 1.0 K/uL   Eosinophils Relative 1 %   Eosinophils Absolute 0.1 0.0 - 0.5 K/uL   Basophils Relative 0 %   Basophils Absolute 0.0 0.0 - 0.1 K/uL   Immature Granulocytes 0 %   Abs Immature Granulocytes 0.03 0.00 - 0.07 K/uL    Comment: Performed at [facility], [address] [place], [place] [address]  Basic metabolic panel     Status: Abnormal   Collection Time: 10/02/23  1:24 PM  Result Value Ref Range   Sodium 128 (L) 135 - 145 mmol/L   Potassium 4.2 3.5 - 5.1 mmol/L   Chloride 98 98 - 111 mmol/L   CO2 24 22 - 32  mmol/L   Glucose, Bld 113 (H) 70 - 99 mg/dL    Comment: Glucose reference range applies only to samples taken after fasting for at least 8 hours.   BUN 11 8 - 23 mg/dL   Creatinine, Ser 6.21 (H) 0.44 - 1.00 mg/dL   Calcium 9.7 8.9 - 30.8 mg/dL   GFR, Estimated 45 (L) >60 mL/min    Comment: (NOTE) Calculated using the CKD-EPI Creatinine Equation (2021)    Anion gap 6 5 - 15    Comment: Performed at [facility], [address], [place], [place] [address]  Urinalysis, w/ Reflex to Culture (Infection Suspected) -Urine, Clean Catch     Status: Abnormal   Collection Time: 10/02/23  3:22 PM  Result Value Ref Range   Specimen Source URINE, CLEAN CATCH    Color, Urine YELLOW YELLOW   APPearance HAZY (A) CLEAR   Specific Gravity, Urine 1.004 (L) 1.005 - 1.030   pH 6.0 5.0 - 8.0   Glucose, UA NEGATIVE NEGATIVE mg/dL   Hgb urine dipstick NEGATIVE NEGATIVE   Bilirubin Urine NEGATIVE NEGATIVE   Ketones, ur NEGATIVE NEGATIVE mg/dL   Protein, ur NEGATIVE NEGATIVE mg/dL    Nitrite NEGATIVE NEGATIVE   Leukocytes,Ua LARGE (A) NEGATIVE   RBC / HPF 21-50 0 - 5 RBC/hpf   WBC, UA >50 0 - 5 WBC/hpf    Comment:        Reflex urine culture not performed if WBC <=10, OR if Squamous epithelial cells >5. If Squamous epithelial cells >5 suggest recollection.    Bacteria, UA FEW (A) NONE SEEN   Squamous Epithelial / HPF 0-5 0 - 5 /HPF   WBC Clumps PRESENT    Mucus PRESENT     Comment: Performed at [facility], [address] [place], [place] [address]   No results found.  Pending Labs Unresulted Labs (From admission, onward)     Start     Ordered   10/03/23 0500  Basic metabolic panel  Tomorrow morning,   R        10/02/23 1650   10/03/23 0500  CBC  Tomorrow morning,   R        10/02/23 1650   10/02/23 1522  Urine Culture  Once,   R        10/02/23 1522            Vitals/Pain Today's Vitals   10/02/23 1229 10/02/23 1230 10/02/23 1236 10/02/23 1553  BP:   (!) 165/67 (!) 154/58  Pulse:   65 (!) 58  Resp:   16 18  Temp:   (!) 97.3 F (36.3 C) (!) 97.4 F (36.3 C)  TempSrc:   Oral   SpO2:   100% 100%  Weight:  189 lb (85.7 kg)    Height:  5\' 6"  (1.676 m)    PainSc: 0-No pain       Isolation Precautions No active isolations  Medications Medications  cefTRIAXone (ROCEPHIN) 1 g in sodium chloride 0.9 % 100 mL IVPB (1 g Intravenous New Bag/Given 10/02/23 1649)  aspirin EC tablet 81 mg (has no administration in time range)  simvastatin (ZOCOR) tablet 20 mg (has no administration in time range)  loratadine (CLARITIN) tablet 10 mg (has no administration in time range)  Centrum Silver Ultra Womens TABS 1 tablet (has no administration in time range)  ascorbic acid (VITAMIN C) tablet 1,000 mg (has no administration in time range)  enoxaparin (LOVENOX) injection 40 mg (has  no administration in time range)  acetaminophen (TYLENOL) tablet 650 mg (has no administration in time range)    Or  acetaminophen (TYLENOL) suppository  650 mg (has no administration in time range)  ondansetron (ZOFRAN) tablet 4 mg (has no administration in time range)    Or  ondansetron (ZOFRAN) injection 4 mg (has no administration in time range)  traZODone (DESYREL) tablet 25 mg (has no administration in time range)  albuterol (PROVENTIL) (2.5 MG/3ML) 0.083% nebulizer solution 2.5 mg (has no administration in time range)  0.9 %  sodium chloride infusion (has no administration in time range)    Mobility manual wheelchair

## 2023-10-03 DIAGNOSIS — Z7982 Long term (current) use of aspirin: Secondary | ICD-10-CM | POA: Diagnosis not present

## 2023-10-03 DIAGNOSIS — N179 Acute kidney failure, unspecified: Secondary | ICD-10-CM | POA: Diagnosis not present

## 2023-10-03 DIAGNOSIS — Z1611 Resistance to penicillins: Secondary | ICD-10-CM | POA: Diagnosis not present

## 2023-10-03 DIAGNOSIS — Z888 Allergy status to other drugs, medicaments and biological substances status: Secondary | ICD-10-CM | POA: Diagnosis not present

## 2023-10-03 DIAGNOSIS — Z7401 Bed confinement status: Secondary | ICD-10-CM | POA: Diagnosis not present

## 2023-10-03 DIAGNOSIS — E222 Syndrome of inappropriate secretion of antidiuretic hormone: Secondary | ICD-10-CM | POA: Diagnosis not present

## 2023-10-03 DIAGNOSIS — E871 Hypo-osmolality and hyponatremia: Secondary | ICD-10-CM | POA: Diagnosis not present

## 2023-10-03 DIAGNOSIS — E875 Hyperkalemia: Secondary | ICD-10-CM | POA: Diagnosis not present

## 2023-10-03 DIAGNOSIS — M25551 Pain in right hip: Secondary | ICD-10-CM | POA: Diagnosis not present

## 2023-10-03 DIAGNOSIS — T68XXXA Hypothermia, initial encounter: Secondary | ICD-10-CM | POA: Diagnosis not present

## 2023-10-03 DIAGNOSIS — R338 Other retention of urine: Secondary | ICD-10-CM | POA: Diagnosis not present

## 2023-10-03 DIAGNOSIS — D649 Anemia, unspecified: Secondary | ICD-10-CM | POA: Diagnosis not present

## 2023-10-03 DIAGNOSIS — K5900 Constipation, unspecified: Secondary | ICD-10-CM | POA: Diagnosis not present

## 2023-10-03 DIAGNOSIS — N3000 Acute cystitis without hematuria: Secondary | ICD-10-CM | POA: Diagnosis not present

## 2023-10-03 DIAGNOSIS — N3946 Mixed incontinence: Secondary | ICD-10-CM | POA: Diagnosis present

## 2023-10-03 DIAGNOSIS — R404 Transient alteration of awareness: Secondary | ICD-10-CM | POA: Diagnosis not present

## 2023-10-03 DIAGNOSIS — M25552 Pain in left hip: Secondary | ICD-10-CM | POA: Diagnosis not present

## 2023-10-03 DIAGNOSIS — N1832 Chronic kidney disease, stage 3b: Secondary | ICD-10-CM

## 2023-10-03 DIAGNOSIS — R680 Hypothermia, not associated with low environmental temperature: Secondary | ICD-10-CM | POA: Diagnosis not present

## 2023-10-03 DIAGNOSIS — E8770 Fluid overload, unspecified: Secondary | ICD-10-CM | POA: Diagnosis not present

## 2023-10-03 DIAGNOSIS — Z87440 Personal history of urinary (tract) infections: Secondary | ICD-10-CM | POA: Diagnosis not present

## 2023-10-03 DIAGNOSIS — E0781 Sick-euthyroid syndrome: Secondary | ICD-10-CM | POA: Diagnosis not present

## 2023-10-03 DIAGNOSIS — E785 Hyperlipidemia, unspecified: Secondary | ICD-10-CM | POA: Diagnosis not present

## 2023-10-03 DIAGNOSIS — G9341 Metabolic encephalopathy: Secondary | ICD-10-CM | POA: Diagnosis not present

## 2023-10-03 DIAGNOSIS — R5381 Other malaise: Secondary | ICD-10-CM | POA: Diagnosis not present

## 2023-10-03 DIAGNOSIS — N390 Urinary tract infection, site not specified: Secondary | ICD-10-CM | POA: Diagnosis not present

## 2023-10-03 DIAGNOSIS — R54 Age-related physical debility: Secondary | ICD-10-CM | POA: Diagnosis present

## 2023-10-03 DIAGNOSIS — E1122 Type 2 diabetes mellitus with diabetic chronic kidney disease: Secondary | ICD-10-CM | POA: Diagnosis not present

## 2023-10-03 DIAGNOSIS — Z79899 Other long term (current) drug therapy: Secondary | ICD-10-CM | POA: Diagnosis not present

## 2023-10-03 DIAGNOSIS — R339 Retention of urine, unspecified: Secondary | ICD-10-CM | POA: Diagnosis not present

## 2023-10-03 DIAGNOSIS — F05 Delirium due to known physiological condition: Secondary | ICD-10-CM | POA: Diagnosis not present

## 2023-10-03 DIAGNOSIS — Z792 Long term (current) use of antibiotics: Secondary | ICD-10-CM | POA: Diagnosis not present

## 2023-10-03 DIAGNOSIS — M160 Bilateral primary osteoarthritis of hip: Secondary | ICD-10-CM | POA: Diagnosis not present

## 2023-10-03 DIAGNOSIS — B961 Klebsiella pneumoniae [K. pneumoniae] as the cause of diseases classified elsewhere: Secondary | ICD-10-CM | POA: Diagnosis present

## 2023-10-03 DIAGNOSIS — I129 Hypertensive chronic kidney disease with stage 1 through stage 4 chronic kidney disease, or unspecified chronic kidney disease: Secondary | ICD-10-CM | POA: Diagnosis not present

## 2023-10-03 DIAGNOSIS — L89626 Pressure-induced deep tissue damage of left heel: Secondary | ICD-10-CM | POA: Diagnosis not present

## 2023-10-03 LAB — BASIC METABOLIC PANEL
Anion gap: 9 (ref 5–15)
BUN: 13 mg/dL (ref 8–23)
CO2: 20 mmol/L — ABNORMAL LOW (ref 22–32)
Calcium: 9.4 mg/dL (ref 8.9–10.3)
Chloride: 101 mmol/L (ref 98–111)
Creatinine, Ser: 1.19 mg/dL — ABNORMAL HIGH (ref 0.44–1.00)
GFR, Estimated: 43 mL/min — ABNORMAL LOW (ref >60)
Glucose, Bld: 175 mg/dL — ABNORMAL HIGH (ref 70–99)
Potassium: 4.3 mmol/L (ref 3.5–5.1)
Sodium: 130 mmol/L — ABNORMAL LOW (ref 135–145)

## 2023-10-03 LAB — CBC
HCT: 30.8 % — ABNORMAL LOW (ref 36.0–46.0)
Hemoglobin: 9.8 g/dL — ABNORMAL LOW (ref 12.0–15.0)
MCH: 28.5 pg (ref 26.0–34.0)
MCHC: 31.8 g/dL (ref 30.0–36.0)
MCV: 89.5 fL (ref 80.0–100.0)
Platelets: 199 10*3/uL (ref 150–400)
RBC: 3.44 MIL/uL — ABNORMAL LOW (ref 3.87–5.11)
RDW: 15.5 % (ref 11.5–15.5)
WBC: 8.7 10*3/uL (ref 4.0–10.5)
nRBC: 0 % (ref 0.0–0.2)

## 2023-10-03 MED ORDER — OLOPATADINE HCL 0.1 % OP SOLN
1.0000 [drp] | Freq: Two times a day (BID) | OPHTHALMIC | Status: DC
  Administered 2023-10-03 – 2023-10-15 (×24): 1 [drp] via OPHTHALMIC
  Filled 2023-10-03 (×2): qty 5

## 2023-10-03 MED ORDER — IRBESARTAN 300 MG PO TABS
300.0000 mg | ORAL_TABLET | Freq: Every day | ORAL | Status: DC
  Administered 2023-10-03 – 2023-10-04 (×2): 300 mg via ORAL
  Filled 2023-10-03 (×2): qty 1

## 2023-10-03 MED ORDER — POLYVINYL ALCOHOL 1.4 % OP SOLN: 1.0000 [drp] | OPHTHALMIC | Status: DC | PRN

## 2023-10-03 MED ORDER — SODIUM CHLORIDE 0.9 % IV SOLN: INTRAVENOUS | Status: AC

## 2023-10-03 NOTE — Assessment & Plan Note (Addendum)
-   ARB resumed at discharge - Lasix resumed 12/5 per nephrology  - Continue amlodipine

## 2023-10-03 NOTE — Assessment & Plan Note (Addendum)
Continue Zocor

## 2023-10-03 NOTE — Assessment & Plan Note (Addendum)
-   Level has been fluctuating during hospitalization but appears to be stabilized at least for now - Nephrology has also been consulted, appreciate assistance - Some concern for underlying component of SIADH - Continue sodium chloride tablets; to be discontinued at discharge - Urea added 10/11/2023 per nephrology and discontinued on 10/12/2023 -Sodium improved to 130 on day of discharge.  3 more days of salt tabs given at discharge -Needs repeat BMP within a week; hopefully through home health

## 2023-10-03 NOTE — Assessment & Plan Note (Signed)
-   patient has history of CKD3b. Baseline creat ~ 1.3 - 1.5, eGFR~ 35

## 2023-10-03 NOTE — Evaluation (Signed)
Physical Therapy Evaluation Patient Details Name: Joanne Patterson MRN: [id number] DOB: [birth year] Today's Date: 10/03/2023  History of Present Illness  Pt is a 89 y.o. female admitted with UTI. PMH significant for HTN, type II DM, hyperlipidemia, recurrent UTI and hyponatremia, L heel wound, R ankle sprain 08/2023, falls  Clinical Impression  On eval, pt required Min-Mod A for mobility. She walked ~40 feet with a RW (with close recliner following). Pt presents with general weakness, decreased activity tolerance, and impaired gait/balance. Pt is at risk for falls when mobilizing. Family was present during session-plan is for pt to return home. She is agreeable to HHPT f/u. Will plan to follow pt during this hospital stay        If plan is discharge home, recommend the following: A lot of help with walking and/or transfers;A lot of help with bathing/dressing/bathroom;Assistance with cooking/housework;Assist for transportation;Help with stairs or ramp for entrance   Can travel by private vehicle        Equipment Recommendations Rolling walker (2 wheels)  Recommendations for Other Services  OT consult    Functional Status Assessment Patient has had a recent decline in their functional status and demonstrates the ability to make significant improvements in function in a reasonable and predictable amount of time.     Precautions / Restrictions Precautions Precautions: Fall Restrictions Weight Bearing Restrictions: No      Mobility  Bed Mobility Overal bed mobility: Needs Assistance             General bed mobility comments: oob in recliner    Transfers Overall transfer level: Needs assistance Equipment used: Rolling walker (2 wheels) Transfers: Sit to/from Stand Sit to Stand: Mod assist           General transfer comment: Increased time. Mod multimodal cues. Assist to power up, steady, control descent.    Ambulation/Gait Ambulation/Gait assistance: Min assist, +2  safety/equipment Gait Distance (Feet): 40 Feet Assistive device: Rolling walker (2 wheels) Gait Pattern/deviations: Step-through pattern, Decreased stride length       General Gait Details: Slow gait speed. Fatigues fairly easily-no warning from pt-had to keep asking. Followed closely with recliner. Fall risk.  Stairs            Wheelchair Mobility     Tilt Bed    Modified Rankin (Stroke Patients Only)       Balance Overall balance assessment: Needs assistance, History of Falls         Standing balance support: Reliant on assistive device for balance, During functional activity, Bilateral upper extremity supported Standing balance-Leahy Scale: Poor                               Pertinent Vitals/Pain Pain Assessment Pain Assessment: No/denies pain    Home Living Family/patient expects to be discharged to:: Private residence Living Arrangements: Children Available Help at Discharge: Family;Available 24 hours/day Type of Home: House Home Access: Stairs to enter Entrance Stairs-Rails: Right;Left;Can reach both Entrance Stairs-Number of Steps: back entrance - 2   Home Layout: One level Home Equipment: Agricultural consultant (2 wheels);Shower seat;Grab bars - toilet;Grab bars - tub/shower Additional Comments: 2-3 falls in November, daughter reports decline since last hospitalization in October    Prior Function Prior Level of Function : History of Falls (last six months)             Mobility Comments: frequent falls, uses RW intermittently, pt fearful of falling, will "  freeze" during walking per daughter ADLs Comments: Daughter assists with ADLs and reports pt experiences increased cognitive decline with UTIs. Reports pt needs significant physical and cognitive assist with UTI presentation, and needs assistance feeding     Extremity/Trunk Assessment   Upper Extremity Assessment Upper Extremity Assessment: Defer to OT evaluation    Lower Extremity  Assessment Lower Extremity Assessment: Generalized weakness    Cervical / Trunk Assessment Cervical / Trunk Assessment: Normal  Communication   Communication Communication: Difficulty following commands/understanding Following commands: Follows one step commands inconsistently Cueing Techniques: Verbal cues;Tactile cues;Visual cues;Gestural cues  Cognition Arousal: Alert Behavior During Therapy: Flat affect Overall Cognitive Status: Impaired/Different from baseline Area of Impairment: Orientation, Following commands, Awareness, Problem solving, Memory, Safety/judgement                 Orientation Level: Place, Time, Situation   Memory: Decreased short-term memory Following Commands: Follows one step commands with increased time Safety/Judgement: Decreased awareness of safety, Decreased awareness of deficits   Problem Solving: Difficulty sequencing, Decreased initiation, Requires verbal cues, Requires tactile cues General Comments: significant cueing required        General Comments      Exercises     Assessment/Plan    PT Assessment Patient needs continued PT services  PT Problem List Decreased strength;Decreased cognition;Decreased activity tolerance;Decreased balance;Decreased mobility;Decreased knowledge of use of DME;Decreased safety awareness       PT Treatment Interventions DME instruction;Gait training;Balance training;Functional mobility training;Therapeutic activities;Patient/family education;Therapeutic exercise    PT Goals (Current goals can be found in the Care Plan section)  Acute Rehab PT Goals Patient Stated Goal: per daughter, for pt to get better, stronger and return home PT Goal Formulation: With family Time For Goal Achievement: 10/17/23 Potential to Achieve Goals: Fair    Frequency Min 1X/week     Co-evaluation               AM-PAC PT "6 Clicks" Mobility  Outcome Measure Help needed turning from your back to your side while in  a flat bed without using bedrails?: A Lot Help needed moving from lying on your back to sitting on the side of a flat bed without using bedrails?: A Lot Help needed moving to and from a bed to a chair (including a wheelchair)?: A Lot Help needed standing up from a chair using your arms (e.g., wheelchair or bedside chair)?: A Lot Help needed to walk in hospital room?: A Lot Help needed climbing 3-5 steps with a railing? : Total 6 Click Score: 11    End of Session Equipment Utilized During Treatment: Gait belt Activity Tolerance: Patient limited by fatigue;Patient tolerated treatment well Patient left: in chair;with call bell/phone within reach;with chair alarm set;with family/visitor present   PT Visit Diagnosis: History of falling (Z91.81);Repeated falls (R29.6);Muscle weakness (generalized) (M62.81);Difficulty in walking, not elsewhere classified (R26.2)    Time: 0454-0981 PT Time Calculation (min) (ACUTE ONLY): 16 min   Charges:   PT Evaluation $PT Eval Low Complexity: 1 Low   PT General Charges $$ ACUTE PT VISIT: 1 Visit            Faye Ramsay, PT Acute Rehabilitation  Office: [phone]

## 2023-10-03 NOTE — Assessment & Plan Note (Addendum)
-   Main symptom appears to be hallucinations and altered mentation on admission.  Original urine specimen obtained 09/26/2023 outpatient and sent to [facility] for testing; appears patient told culture was positive on 11/25 and to go to ER - obtained fax report and is placed in chart; unfortunately only says "mixed urogenital flora, > 100k colonies). No other micro data sent -Urine culture inpatient grew Klebsiella and Aerococcus.  Patient completed 10-day course of Rocephin during hospitalization

## 2023-10-03 NOTE — Evaluation (Signed)
SLP Cancellation Note  Patient Details Name: ENIYA CAZIER MRN: [id number] DOB: 19-Jun-1928   Cancelled treatment:       Reason Eval/Treat Not Completed: Other (comment) (Cognitive evaluation ordered, pt has UTI and not being worked up for neuro change; will discontinue order; please reorder if desired.)  Rolena Infante, MS [facility] SLP Acute Rehab Services Office [phone]   Chales Abrahams 10/03/2023, 8:15 AM

## 2023-10-03 NOTE — Hospital Course (Addendum)
Joanne Patterson is a 89 yo female with PMH recurrent UTIs, hypertension, DM II, HLD who presented up on recommendation from Center well after outpatient urine culture came back positive. Patient's daughter also provided collateral information stating that the patient was continuing to become more confused and hallucinating prior to admission.  They had obtained urine specimen on 09/26/2023 and had not yet started/been prescribed antibiotics prior to hospitalization. UA on admission showed large LE, negative nitrite, greater than 50 WBC.  She was started on Rocephin and admitted for further workup and monitoring.  Urine culture grew Klebsiella and Aerococcus. She completed 10-day course of Rocephin during hospitalization.

## 2023-10-03 NOTE — Evaluation (Addendum)
Occupational Therapy Evaluation Patient Details Name: Joanne Patterson MRN: [id number] DOB: Feb 26, 1928 Today's Date: 10/03/2023   History of Present Illness Pt is a 89 y.o. female admitted with UTI. PMH significant for HTN, type II DM, hyperlipidemia, recurrent UTI and hyponatremia, L heel wound, R ankle sprain 08/2023.   Clinical Impression   Prior to hospital admission, pt requires assist with ADLs and used RW for mobility with frequent falls. Pt mostly sedentary in recliner. Pt lives with daughter. Daughter provides PLOF as pt poor historian, and only oriented to self and DOB. Per daughter, pt with gradual functional decline since October of this year. Daughter endorses that pt's current level of cognitive and visual deficits are due to the UTI. RN notified of daughter's reports of visual acuity changes, visual hallucinations and bilateral ankle swelling/generally swollen appearance.   Pt currently requires min-modA for all ADLs, modA to step pivot to recliner with pt spontaneously attempting to sit in recliner, requiring maxA to pivot buttocks to recliner safely. Extensive multimodal cuing is necessary to support task performance and initiation of mobility. Pt would benefit from skilled OT services to address noted impairments and functional limitations (see below for any additional details) in order to maximize safety and independence while minimizing falls risk and caregiver burden. Anticipate the need for follow up OT services upon acute hospital DC - daughter has stated the plan is to resume HH OT/PT upon discharge. Patient will benefit from continued inpatient follow up therapy, <3 hours/day.         If plan is discharge home, recommend the following: A lot of help with walking and/or transfers;A lot of help with bathing/dressing/bathroom;Assistance with cooking/housework;Assistance with feeding;Direct supervision/assist for medications management;Direct supervision/assist for financial  management;Assist for transportation;Help with stairs or ramp for entrance;Supervision due to cognitive status    Functional Status Assessment  Patient has had a recent decline in their functional status and demonstrates the ability to make significant improvements in function in a reasonable and predictable amount of time.  Equipment Recommendations  None recommended by OT    Recommendations for Other Services Other (comment)     Precautions / Restrictions Precautions Precautions: Fall Precaution Comments: poor cognition Restrictions Weight Bearing Restrictions: No      Mobility Bed Mobility Overal bed mobility: Needs Assistance Bed Mobility: Supine to Sit     Supine to sit: Min assist, HOB elevated     General bed mobility comments: cues for sequencing, minA for trunk support and increased time    Transfers Overall transfer level: Needs assistance Equipment used: Rolling walker (2 wheels) Transfers: Bed to chair/wheelchair/BSC, Sit to/from Stand Sit to Stand: Mod assist, From elevated surface     Step pivot transfers: Mod assist     General transfer comment: cues for sequencing and safety, pt requires mod A to power up to stand and spontanously sits halfway through transition, maxA to pivot posterior safety to recliner      Balance Overall balance assessment: History of Falls, Needs assistance Sitting-balance support: Single extremity supported, Feet supported Sitting balance-Leahy Scale: Fair Sitting balance - Comments: washes face sitting EOB with cues   Standing balance support: Reliant on assistive device for balance, During functional activity, Bilateral upper extremity supported Standing balance-Leahy Scale: Poor                             ADL either performed or assessed with clinical judgement   ADL Overall ADL's : Needs  assistance/impaired Eating/Feeding: Minimal assistance Eating/Feeding Details (indicate cue type and reason): needs  cues to initiate self-feeding Grooming: Wash/dry face;Wash/dry hands;Sitting;Minimal assistance;Cueing for sequencing;Cueing for safety                   Toilet Transfer: Moderate assistance;Cueing for sequencing;Stand-pivot;BSC/3in1;Rolling walker (2 wheels) Toilet Transfer Details (indicate cue type and reason): mod assist with power up to stand, requires multimodal cuing for motor planning with slow movements; pt initiating stand pivot transfer using RW, and electing to spontanously sit halfway through transition, requiring maxA to safety sit in recliner Toileting- Clothing Manipulation and Hygiene: Moderate assistance;Sit to/from stand;Sitting/lateral lean       Functional mobility during ADLs: Moderate assistance;Cueing for sequencing;Cueing for safety;Rolling walker (2 wheels) General ADL Comments: Pt's current level of cognitive deficits require significant multimodal cuing for sequencing and safety. Pt limited by generalized weakness and confusion.     Vision Baseline Vision/History: 5 Retinopathy Ability to See in Adequate Light: 3 Highly impaired Patient Visual Report: Other (comment) (daughter reports that vision has changed from  baseline (UTI seems to cause significant changes in pt) and she is having difficulty seeing small/large print.) Additional Comments: does not wear glasses            Pertinent Vitals/Pain Pain Assessment Pain Assessment: 0-10 Pain Score: 3  Pain Location: L calf / leg Pain Descriptors / Indicators: Sharp, Shooting Pain Intervention(s): Monitored during session, Repositioned     Extremity/Trunk Assessment Upper Extremity Assessment Upper Extremity Assessment: Generalized weakness;Right hand dominant (LUE appears to be edemous vs RUE (daughter reports baseline). ROM WFL BUE; bilat grip strength poor, 3/5 bilat strength)   Lower Extremity Assessment Lower Extremity Assessment: Generalized weakness       Communication  Communication Communication: Difficulty following commands/understanding Following commands: Follows one step commands inconsistently Cueing Techniques: Verbal cues;Gestural cues;Tactile cues (daughter assists with continual cues throughout session)   Cognition Arousal: Alert Behavior During Therapy: Flat affect Overall Cognitive Status: Impaired/Different from baseline Area of Impairment: Orientation, Following commands, Awareness, Problem solving, Memory, Safety/judgement                 Orientation Level: Place, Time, Situation   Memory: Decreased short-term memory Following Commands: Follows one step commands with increased time Safety/Judgement: Decreased awareness of safety, Decreased awareness of deficits   Problem Solving: Difficulty sequencing, Decreased initiation, Requires verbal cues, Requires tactile cues General Comments: max multimodal cuing for all tasks     General Comments  Notified RN of L leg pain, bilateral ankle swelling, extremeties overall appearing puffy, vision changes, mobility and recommendations for transfer            Home Living Family/patient expects to be discharged to:: Private residence Living Arrangements: Children Available Help at Discharge: Family;Available 24 hours/day Type of Home: House Home Access: Stairs to enter [employer] of Steps: back entrance - 2 Entrance Stairs-Rails: Right;Left;Can reach both Home Layout: One level     Bathroom Shower/Tub: Other (comment)   Bathroom Toilet: Handicapped height Bathroom Accessibility: Yes How Accessible: Accessible via walker Home Equipment: Rolling Walker (2 wheels);Shower seat;Grab bars - toilet;Grab bars - tub/shower   Additional Comments: 2-3 falls in November, daughter reports decline since last hospitalization in October      Prior Functioning/Environment Prior Level of Function : History of Falls (last six months)             Mobility Comments: frequent  falls, uses RW intermittently, pt fearful of falling, will "freeze" during walking  per daughter ADLs Comments: Daughter assists with ADLs and reports pt experiences increased cognitive decline with UTIs. Reports pt needs significant physical and cognitive assist with UTI presentation, and needs assistance feeding        OT Problem List: Decreased strength;Impaired balance (sitting and/or standing);Decreased range of motion;Impaired vision/perception;Decreased coordination;Decreased cognition;Decreased safety awareness;Decreased knowledge of use of DME or AE      OT Treatment/Interventions: Self-care/ADL training;Therapeutic exercise;Neuromuscular education;DME and/or AE instruction;Therapeutic activities;Cognitive remediation/compensation;Visual/perceptual remediation/compensation;Patient/family education;Balance training    OT Goals(Current goals can be found in the care plan section) Acute Rehab OT Goals Patient Stated Goal: pt unable OT Goal Formulation: Patient unable to participate in goal setting Time For Goal Achievement: 10/20/23 Potential to Achieve Goals: Fair  OT Frequency: Min 1X/week       AM-PAC OT "6 Clicks" Daily Activity     Outcome Measure Help from another person eating meals?: A Little Help from another person taking care of personal grooming?: A Little Help from another person toileting, which includes using toliet, bedpan, or urinal?: A Lot Help from another person bathing (including washing, rinsing, drying)?: A Lot Help from another person to put on and taking off regular upper body clothing?: A Little Help from another person to put on and taking off regular lower body clothing?: A Lot 6 Click Score: 15   End of Session Equipment Utilized During Treatment: Rolling walker (2 wheels);Gait belt Nurse Communication: Mobility status  Activity Tolerance: Patient tolerated treatment well Patient left: in chair;with call bell/phone within reach;with chair alarm  set;with family/visitor present;with nursing/sitter in room  OT Visit Diagnosis: Repeated falls (R29.6);Muscle weakness (generalized) (M62.81);Unsteadiness on feet (R26.81);Other symptoms and signs involving cognitive function                Time: 1308-6578 OT Time Calculation (min): 54 min Charges:  OT General Charges $OT Visit: 1 Visit OT Evaluation $OT Eval Moderate Complexity: 1 Mod OT Treatments $Self Care/Home Management : 23-37 mins Demetrias Goodbar L. Lexys Milliner, OTR/L  10/03/23, 9:59 AM

## 2023-10-03 NOTE — Progress Notes (Signed)
Progress Note    Joanne Patterson   YNW:[id number]  DOB: Jun 17, 1928  DOA: 10/02/2023     0 PCP: Jarrett Soho, PA-C  Initial CC: Hallucinations  Hospital Course: Joanne Patterson is a 89 yo female with PMH recurrent UTIs, hypertension, DM II, HLD who presented up on recommendation from Center well after outpatient urine culture came back positive. Patient's daughter also provided collateral information stating that the patient was continuing to become more confused and hallucinating prior to admission.  They had obtained urine specimen on 09/26/2023 and had not yet started/been prescribed antibiotics prior to hospitalization. UA on admission showed large LE, negative nitrite, greater than 50 WBC.  She was started on Rocephin and admitted for further workup and monitoring. The morning following admission, mentation had already been improving and patient appetite also improved.  Interval History:  Sitting in recliner when seen this morning with daughter present bedside.  Biggest concern from daughter was the worsening of mentation and hallucinations she developed prior to admission. They were unsure what the outpatient cultures showed but were told to present to the ER due to a positive urine culture.  Assessment and Plan: * Acute cystitis - Main symptom appears to be hallucinations and altered mentation on admission.  Original urine specimen obtained 09/26/2023 outpatient and sent to [facility] for testing; appears patient told culture was positive on 11/25 and to go to ER - obtained fax report and is placed in chart; unfortunately only says "mixed urogenital flora, > 100k colonies). No other micro data sent - the repeat urine culture here already showing GNR; continue rocephin for now and watch culture; no blood cultures were obtained on admission and now with doses given, lower yield to obtain   Hyponatremia - Mild and asymptomatic - Continue fluids and trend BMP  Chronic kidney  disease, stage 3b (HCC) - patient has history of CKD3b. Baseline creat ~ 1.3 - 1.5, eGFR~ 35   Hyperlipidemia - Continue Zocor  Essential hypertension - Resume ARB for now.  Continue holding amlodipine and Lasix - Will resume amlodipine if blood pressure further up trends   Old records reviewed in assessment of this patient  Antimicrobials: Rocephin 10/02/2023 >> current  DVT prophylaxis:  enoxaparin (LOVENOX) injection 40 mg Start: 10/02/23 2200 SCDs Start: 10/02/23 1650   Code Status:   Code Status: Full Code  Mobility Assessment (Last 72 Hours)     Mobility Assessment     Row Name 10/03/23 1304 10/03/23 0900 10/03/23 0745 10/02/23 2022 10/02/23 1800   Does patient have an order for bedrest or is patient medically unstable -- -- No - Continue assessment No - Continue assessment No - Continue assessment   What is the highest level of mobility based on the progressive mobility assessment? Level 4 (Walks with assist in room) - Balance while marching in place and cannot step forward and back - Complete Level 3 (Stands with assist) - Balance while standing  and cannot march in place Level 3 (Stands with assist) - Balance while standing  and cannot march in place Level 2 (Chairfast) - Balance while sitting on edge of bed and cannot stand Level 2 (Chairfast) - Balance while sitting on edge of bed and cannot stand   Is the above level different from baseline mobility prior to current illness? -- -- -- Yes - Recommend PT order Yes - Recommend PT order            Barriers to discharge: none Disposition Plan:  Home Status is:  Obs  Objective: Blood pressure (!) 144/64, pulse 76, temperature (!) 97.4 F (36.3 C), temperature source Oral, resp. rate 20, height 5\' 6"  (1.676 m), weight 85.7 kg, SpO2 100%.  Examination:  Physical Exam Constitutional:      Appearance: Normal appearance.  HENT:     Head: Normocephalic and atraumatic.     Mouth/Throat:     Mouth: Mucous membranes  are moist.  Eyes:     Extraocular Movements: Extraocular movements intact.  Cardiovascular:     Rate and Rhythm: Normal rate and regular rhythm.  Pulmonary:     Effort: Pulmonary effort is normal. No respiratory distress.     Breath sounds: Normal breath sounds. No wheezing.  Abdominal:     General: Bowel sounds are normal. There is no distension.     Palpations: Abdomen is soft.     Tenderness: There is no abdominal tenderness.  Musculoskeletal:        General: Swelling (trace LE B/L) present. Normal range of motion.     Cervical back: Normal range of motion and neck supple.  Skin:    General: Skin is warm and dry.  Neurological:     General: No focal deficit present.     Mental Status: She is alert.  Psychiatric:        Mood and Affect: Mood normal.        Behavior: Behavior normal.      Consultants:    Procedures:    Data Reviewed: Results for orders placed or performed during the hospital encounter of 10/02/23 (from the past 24 hour(s))  CBC with Differential/Platelet     Status: Abnormal   Collection Time: 10/02/23  1:24 PM  Result Value Ref Range   WBC 8.5 4.0 - 10.5 K/uL   RBC 3.83 (L) 3.87 - 5.11 MIL/uL   Hemoglobin 11.3 (L) 12.0 - 15.0 g/dL   HCT 16.1 (L) 09.6 - 04.5 %   MCV 86.4 80.0 - 100.0 fL   MCH 29.5 26.0 - 34.0 pg   MCHC 34.1 30.0 - 36.0 g/dL   RDW 40.9 81.1 - 91.4 %   Platelets 217 150 - 400 K/uL   nRBC 0.0 0.0 - 0.2 %   Neutrophils Relative % 59 %   Neutro Abs 5.0 1.7 - 7.7 K/uL   Lymphocytes Relative 33 %   Lymphs Abs 2.8 0.7 - 4.0 K/uL   Monocytes Relative 7 %   Monocytes Absolute 0.6 0.1 - 1.0 K/uL   Eosinophils Relative 1 %   Eosinophils Absolute 0.1 0.0 - 0.5 K/uL   Basophils Relative 0 %   Basophils Absolute 0.0 0.0 - 0.1 K/uL   Immature Granulocytes 0 %   Abs Immature Granulocytes 0.03 0.00 - 0.07 K/uL  Basic metabolic panel     Status: Abnormal   Collection Time: 10/02/23  1:24 PM  Result Value Ref Range   Sodium 128 (L) 135 -  145 mmol/L   Potassium 4.2 3.5 - 5.1 mmol/L   Chloride 98 98 - 111 mmol/L   CO2 24 22 - 32 mmol/L   Glucose, Bld 113 (H) 70 - 99 mg/dL   BUN 11 8 - 23 mg/dL   Creatinine, Ser 7.82 (H) 0.44 - 1.00 mg/dL   Calcium 9.7 8.9 - 95.6 mg/dL   GFR, Estimated 45 (L) >60 mL/min   Anion gap 6 5 - 15  Urinalysis, w/ Reflex to Culture (Infection Suspected) -Urine, Clean Catch     Status: Abnormal   Collection Time:  10/02/23  3:22 PM  Result Value Ref Range   Specimen Source URINE, CLEAN CATCH    Color, Urine YELLOW YELLOW   APPearance HAZY (A) CLEAR   Specific Gravity, Urine 1.004 (L) 1.005 - 1.030   pH 6.0 5.0 - 8.0   Glucose, UA NEGATIVE NEGATIVE mg/dL   Hgb urine dipstick NEGATIVE NEGATIVE   Bilirubin Urine NEGATIVE NEGATIVE   Ketones, ur NEGATIVE NEGATIVE mg/dL   Protein, ur NEGATIVE NEGATIVE mg/dL   Nitrite NEGATIVE NEGATIVE   Leukocytes,Ua LARGE (A) NEGATIVE   RBC / HPF 21-50 0 - 5 RBC/hpf   WBC, UA >50 0 - 5 WBC/hpf   Bacteria, UA FEW (A) NONE SEEN   Squamous Epithelial / HPF 0-5 0 - 5 /HPF   WBC Clumps PRESENT    Mucus PRESENT   Urine Culture     Status: Abnormal (Preliminary result)   Collection Time: 10/02/23  3:22 PM   Specimen: Urine, Clean Catch  Result Value Ref Range   Specimen Description      URINE, CLEAN CATCH Performed at [facility], [address], [place], [place] [address]    Special Requests      NONE Reflexed from [id number] Performed at [facility], [address] [place], [place] [address]    Culture >=100,000 COLONIES/mL GRAM NEGATIVE RODS (A)    Report Status PENDING   Basic metabolic panel     Status: Abnormal   Collection Time: 10/03/23  5:56 AM  Result Value Ref Range   Sodium 130 (L) 135 - 145 mmol/L   Potassium 4.3 3.5 - 5.1 mmol/L   Chloride 101 98 - 111 mmol/L   CO2 20 (L) 22 - 32 mmol/L   Glucose, Bld 175 (H) 70 - 99 mg/dL   BUN 13 8 - 23 mg/dL   Creatinine, Ser 3.08 (H) 0.44 - 1.00 mg/dL   Calcium 9.4 8.9 - 65.7  mg/dL   GFR, Estimated 43 (L) >60 mL/min   Anion gap 9 5 - 15  CBC     Status: Abnormal   Collection Time: 10/03/23  5:56 AM  Result Value Ref Range   WBC 8.7 4.0 - 10.5 K/uL   RBC 3.44 (L) 3.87 - 5.11 MIL/uL   Hemoglobin 9.8 (L) 12.0 - 15.0 g/dL   HCT 84.6 (L) 96.2 - 95.2 %   MCV 89.5 80.0 - 100.0 fL   MCH 28.5 26.0 - 34.0 pg   MCHC 31.8 30.0 - 36.0 g/dL   RDW 84.1 32.4 - 40.1 %   Platelets 199 150 - 400 K/uL   nRBC 0.0 0.0 - 0.2 %    I have reviewed pertinent nursing notes, vitals, labs, and images as necessary. I have ordered labwork to follow up on as indicated.  I have reviewed the last notes from staff over past 24 hours. I have discussed patient's care plan and test results with nursing staff, CM/SW, and other staff as appropriate.  Time spent: Greater than 50% of the 55 minute visit was spent in counseling/coordination of care for the patient as laid out in the A&P.   LOS: 0 days   Lewie Chamber, MD [facility] 10/03/2023, 1:14 PM

## 2023-10-04 DIAGNOSIS — N390 Urinary tract infection, site not specified: Secondary | ICD-10-CM | POA: Diagnosis not present

## 2023-10-04 LAB — MAGNESIUM: Magnesium: 2.1 mg/dL (ref 1.7–2.4)

## 2023-10-04 LAB — BASIC METABOLIC PANEL WITH GFR
Anion gap: 11 (ref 5–15)
BUN: 16 mg/dL (ref 8–23)
CO2: 21 mmol/L — ABNORMAL LOW (ref 22–32)
Calcium: 10.1 mg/dL (ref 8.9–10.3)
Chloride: 100 mmol/L (ref 98–111)
Creatinine, Ser: 1.23 mg/dL — ABNORMAL HIGH (ref 0.44–1.00)
GFR, Estimated: 41 mL/min — ABNORMAL LOW
Glucose, Bld: 113 mg/dL — ABNORMAL HIGH (ref 70–99)
Potassium: 4.1 mmol/L (ref 3.5–5.1)
Sodium: 132 mmol/L — ABNORMAL LOW (ref 135–145)

## 2023-10-04 LAB — CBC WITH DIFFERENTIAL/PLATELET
Abs Immature Granulocytes: 0.05 10*3/uL (ref 0.00–0.07)
Basophils Absolute: 0 10*3/uL (ref 0.0–0.1)
Basophils Relative: 0 %
Eosinophils Absolute: 0.1 10*3/uL (ref 0.0–0.5)
Eosinophils Relative: 1 %
HCT: 32.8 % — ABNORMAL LOW (ref 36.0–46.0)
Hemoglobin: 10.5 g/dL — ABNORMAL LOW (ref 12.0–15.0)
Immature Granulocytes: 1 %
Lymphocytes Relative: 32 %
Lymphs Abs: 2.8 10*3/uL (ref 0.7–4.0)
MCH: 28.5 pg (ref 26.0–34.0)
MCHC: 32 g/dL (ref 30.0–36.0)
MCV: 89.1 fL (ref 80.0–100.0)
Monocytes Absolute: 0.7 10*3/uL (ref 0.1–1.0)
Monocytes Relative: 8 %
Neutro Abs: 5.1 10*3/uL (ref 1.7–7.7)
Neutrophils Relative %: 58 %
Platelets: 220 10*3/uL (ref 150–400)
RBC: 3.68 MIL/uL — ABNORMAL LOW (ref 3.87–5.11)
RDW: 15.1 % (ref 11.5–15.5)
WBC: 8.6 10*3/uL (ref 4.0–10.5)
nRBC: 0 % (ref 0.0–0.2)

## 2023-10-04 MED ORDER — QUETIAPINE FUMARATE 25 MG PO TABS
12.5000 mg | ORAL_TABLET | Freq: Every day | ORAL | Status: DC
  Administered 2023-10-04: 12.5 mg via ORAL
  Filled 2023-10-04: qty 1

## 2023-10-04 MED ORDER — QUETIAPINE FUMARATE 25 MG PO TABS
12.5000 mg | ORAL_TABLET | Freq: Two times a day (BID) | ORAL | Status: DC | PRN
  Administered 2023-10-04 – 2023-10-05 (×2): 12.5 mg via ORAL
  Filled 2023-10-04 (×2): qty 1

## 2023-10-04 MED ORDER — AMLODIPINE BESYLATE 10 MG PO TABS
10.0000 mg | ORAL_TABLET | Freq: Every day | ORAL | Status: DC
  Administered 2023-10-04 – 2023-10-15 (×12): 10 mg via ORAL
  Filled 2023-10-04 (×12): qty 1

## 2023-10-04 MED ORDER — HYDRALAZINE HCL 25 MG PO TABS
25.0000 mg | ORAL_TABLET | Freq: Three times a day (TID) | ORAL | Status: DC
  Administered 2023-10-04 – 2023-10-15 (×32): 25 mg via ORAL
  Filled 2023-10-04 (×33): qty 1

## 2023-10-04 MED ORDER — MELATONIN 5 MG PO TABS
5.0000 mg | ORAL_TABLET | Freq: Once | ORAL | Status: AC
  Administered 2023-10-04: 5 mg via ORAL
  Filled 2023-10-04: qty 1

## 2023-10-04 NOTE — Plan of Care (Signed)

## 2023-10-04 NOTE — Progress Notes (Signed)
Mobility Specialist - Progress Note   10/04/23 1047  Mobility  Activity Stood at bedside  Level of Assistance Contact guard assist, steadying assist  Assistive Device Front wheel walker  Activity Response Tolerated well  Mobility Referral Yes  $Mobility charge 1 Mobility  Mobility Specialist Start Time (ACUTE ONLY) 1028  Mobility Specialist Stop Time (ACUTE ONLY) 1046  Mobility Specialist Time Calculation (min) (ACUTE ONLY) 18 min   Pt received in recliner and agreeable to mobility after some motivation. Pt was modA from STS. Pt tolerated standing for ~83min. Able to take one step back & once step forwards. No complaints during session. Pt to recliner after session with all needs met. Family in room.   [facility]

## 2023-10-04 NOTE — Progress Notes (Signed)
Mobility Specialist - Progress Note   10/04/23 1328  Mobility  Activity Transferred to/from [facility];Ambulated with assistance in hallway;Ambulated with assistance in room  Level of Assistance Minimal assist, patient does 75% or more  Assistive Device Front wheel walker  Distance Ambulated (ft) 10 ft  Activity Response Tolerated well  Mobility Referral Yes  $Mobility charge 1 Mobility  Mobility Specialist Start Time (ACUTE ONLY) 1301  Mobility Specialist Stop Time (ACUTE ONLY) 1327  Mobility Specialist Time Calculation (min) (ACUTE ONLY) 26 min   Pt received in recliner and agreeable to mobility. Prior to ambulating, assisted pt to [facility]. Pt was modA +2 from STS & CG during session. Wheeled pt back to room in recliner d/t fatigue. No complaints during session. Pt to recliner after session. Chair alarm on & daughter in room.    [facility]

## 2023-10-04 NOTE — Progress Notes (Addendum)
PROGRESS NOTE    Joanne Patterson  ZOX:[id number] DOB: [birth year] DOA: 10/02/2023 PCP: Jarrett Soho, PA-C   Brief Narrative:  88 yo female with PMH recurrent UTIs, hypertension, DM II, HLD who presented up on recommendation from Center well after outpatient urine culture came back positive. Patient's daughter also provided collateral information stating that the patient was continuing to become more confused and hallucinating prior to admission.  They had obtained urine specimen on 09/26/2023 and had not yet started/been prescribed antibiotics prior to hospitalization. UA on admission showed large LE, negative nitrite, greater than 50 WBC.  She was started on Rocephin and admitted for further workup and monitoring.  Assessment & Plan:   Principal Problem:   Acute cystitis Active Problems:   Hyponatremia   Essential hypertension   Hyperlipidemia   Chronic kidney disease, stage 3b (HCC)  GNR UTI- - Main symptom appears to be hallucinations and altered mentation on admission.  Original urine specimen obtained 09/26/2023 outpatient and sent to [facility] for testing; appears patient told culture was positive on 11/25 and to go to ER - obtained fax report and is placed in chart; unfortunately only says "mixed urogenital flora, > 100k colonies). No other micro data sent - the repeat urine culture here already showing GNR sensitivity pending continue rocephin for now and follow culture  no blood cultures were obtained on admission and now with doses given, lower yield to obtain    Hyponatremia improving  - Mild and asymptomatic - Continue fluids and trend BMP   Chronic kidney disease, stage 3b (HCC) - patient has history of CKD3b. Baseline creat ~ 1.3 - 1.5, eGFR~ 35   Hyperlipidemia - Continue Zocor   Essential hypertension-hold arb and lasix Start norvasc Add hydralazine  Deep tissue pressure injury left heel present on admission  Pressure Injury 10/02/23 Heel Left Deep  Tissue Pressure Injury - Purple or maroon localized area of discolored intact skin or blood-filled blister due to damage of underlying soft tissue from pressure and/or shear. (Active)  10/02/23 1809  Location: Heel  Location Orientation: Left  Staging: Deep Tissue Pressure Injury - Purple or maroon localized area of discolored intact skin or blood-filled blister due to damage of underlying soft tissue from pressure and/or shear.  Wound Description (Comments):   Present on Admission: Yes  Dressing Type None 10/03/23 0745     Estimated body mass index is 30.51 kg/m as calculated from the following:   Height as of this encounter: 5\' 6"  (1.676 m).   Weight as of this encounter: 85.7 kg.  DVT prophylaxis: lovenox Code Status: full Family Communication:dw daughter Disposition Plan:  Status is: Inpatient Remains inpatient appropriate because:    Consultants: none  Procedures: none Antimicrobials:rocephin  Subjective: Daughter at bed side Patient still hallucinating did not sleep 3 nights Good appetite  Objective: Vitals:   10/03/23 0658 10/03/23 1345 10/03/23 2045 10/04/23 0600  BP: (!) 144/64 (!) 156/58 (!) 159/90 (!) 168/63  Pulse: 76 74 71 72  Resp: 20 18 14 14   Temp: (!) 97.4 F (36.3 C) (!) 97.4 F (36.3 C) (!) 97.4 F (36.3 C) (!) 97.3 F (36.3 C)  TempSrc: Oral Oral Oral Oral  SpO2: 100% 100% 100% 100%  Weight:      Height:        Intake/Output Summary (Last 24 hours) at 10/04/2023 1154 Last data filed at 10/04/2023 1008 Gross per 24 hour  Intake 240 ml  Output 2 ml  Net 238 ml   [employer]  10/02/23 1230  Weight: 85.7 kg    Examination:  General exam: Appears to be hallucinating  Respiratory system: Clear to auscultation. Respiratory effort normal. Cardiovascular system: S1 & S2 heard, RRR. No JVD, murmurs, rubs, gallops or clicks. No pedal edema. Gastrointestinal system: Abdomen is nondistended, soft and nontender. No organomegaly or masses  felt. Normal bowel sounds heard. Central nervous system: confused hallucinating Moves all extremities Extremities:trace edema   Data Reviewed: I have personally reviewed following labs and imaging studies  CBC: Recent Labs  Lab 10/02/23 1324 10/03/23 0556 10/04/23 0555  WBC 8.5 8.7 8.6  NEUTROABS 5.0  --  5.1  HGB 11.3* 9.8* 10.5*  HCT 33.1* 30.8* 32.8*  MCV 86.4 89.5 89.1  PLT 217 199 220   Basic Metabolic Panel: Recent Labs  Lab 10/02/23 1324 10/03/23 0556 10/04/23 0555  NA 128* 130* 132*  K 4.2 4.3 4.1  CL 98 101 100  CO2 24 20* 21*  GLUCOSE 113* 175* 113*  BUN 11 13 16   CREATININE 1.14* 1.19* 1.23*  CALCIUM 9.7 9.4 10.1  MG  --   --  2.1   GFR: Estimated Creatinine Clearance: 31.5 mL/min (A) (by C-G formula based on SCr of 1.23 mg/dL (H)). Liver Function Tests: No results for input(s): "AST", "ALT", "ALKPHOS", "BILITOT", "PROT", "ALBUMIN" in the last 168 hours. No results for input(s): "LIPASE", "AMYLASE" in the last 168 hours. No results for input(s): "AMMONIA" in the last 168 hours. Coagulation Profile: No results for input(s): "INR", "PROTIME" in the last 168 hours. Cardiac Enzymes: No results for input(s): "CKTOTAL", "CKMB", "CKMBINDEX", "TROPONINI" in the last 168 hours. BNP (last 3 results) No results for input(s): "PROBNP" in the last 8760 hours. HbA1C: No results for input(s): "HGBA1C" in the last 72 hours. CBG: No results for input(s): "GLUCAP" in the last 168 hours. Lipid Profile: No results for input(s): "CHOL", "HDL", "LDLCALC", "TRIG", "CHOLHDL", "LDLDIRECT" in the last 72 hours. Thyroid Function Tests: No results for input(s): "TSH", "T4TOTAL", "FREET4", "T3FREE", "THYROIDAB" in the last 72 hours. Anemia Panel: No results for input(s): "VITAMINB12", "FOLATE", "FERRITIN", "TIBC", "IRON", "RETICCTPCT" in the last 72 hours. Sepsis Labs: No results for input(s): "PROCALCITON", "LATICACIDVEN" in the last 168 hours.  Recent Results (from the  past 240 hour(s))  Urine Culture     Status: Abnormal (Preliminary result)   Collection Time: 10/02/23  3:22 PM   Specimen: Urine, Clean Catch  Result Value Ref Range Status   Specimen Description   Final    URINE, CLEAN CATCH Performed at [facility], [address], [place], [place] [address]    Special Requests   Final    NONE Reflexed from [id number] Performed at [facility], [address], [place], [place] [address]    Culture (A)  Final    >=100,000 COLONIES/mL GRAM NEGATIVE RODS IDENTIFICATION AND SUSCEPTIBILITIES TO FOLLOW Performed at [facility], [address] [place], [place] [address]    Report Status PENDING  Incomplete     Radiology Studies: No results found.   Scheduled Meds:  amLODipine  10 mg Oral Daily   vitamin C  1,000 mg Oral Daily   aspirin EC  81 mg Oral Q M,W,F   enoxaparin (LOVENOX) injection  40 mg Subcutaneous Q24H   irbesartan  300 mg Oral Daily   multivitamin with minerals  1 tablet Oral Q breakfast   olopatadine  1 drop Both Eyes BID   QUEtiapine  12.5 mg Oral QHS  simvastatin  20 mg Oral QPM   Continuous Infusions:  cefTRIAXone (ROCEPHIN)  IV 1 g (10/03/23 1657)     LOS: 1 day    Time spent: 38 min  Alwyn Ren, MD 10/04/2023, 11:54 AM

## 2023-10-05 DIAGNOSIS — N390 Urinary tract infection, site not specified: Secondary | ICD-10-CM | POA: Diagnosis not present

## 2023-10-05 LAB — CBC WITH DIFFERENTIAL/PLATELET
Abs Immature Granulocytes: 0.02 10*3/uL (ref 0.00–0.07)
Basophils Absolute: 0 10*3/uL (ref 0.0–0.1)
Basophils Relative: 0 %
Eosinophils Absolute: 0.1 10*3/uL (ref 0.0–0.5)
Eosinophils Relative: 2 %
HCT: 32.9 % — ABNORMAL LOW (ref 36.0–46.0)
Hemoglobin: 10.6 g/dL — ABNORMAL LOW (ref 12.0–15.0)
Immature Granulocytes: 0 %
Lymphocytes Relative: 35 %
Lymphs Abs: 2.3 10*3/uL (ref 0.7–4.0)
MCH: 28.6 pg (ref 26.0–34.0)
MCHC: 32.2 g/dL (ref 30.0–36.0)
MCV: 88.9 fL (ref 80.0–100.0)
Monocytes Absolute: 0.4 10*3/uL (ref 0.1–1.0)
Monocytes Relative: 7 %
Neutro Abs: 3.7 10*3/uL (ref 1.7–7.7)
Neutrophils Relative %: 56 %
Platelets: 208 10*3/uL (ref 150–400)
RBC: 3.7 MIL/uL — ABNORMAL LOW (ref 3.87–5.11)
RDW: 15.4 % (ref 11.5–15.5)
WBC: 6.6 10*3/uL (ref 4.0–10.5)
nRBC: 0 % (ref 0.0–0.2)

## 2023-10-05 LAB — URINE CULTURE

## 2023-10-05 LAB — BASIC METABOLIC PANEL
Anion gap: 11 (ref 5–15)
BUN: 16 mg/dL (ref 8–23)
CO2: 19 mmol/L — ABNORMAL LOW (ref 22–32)
Calcium: 9.9 mg/dL (ref 8.9–10.3)
Chloride: 98 mmol/L (ref 98–111)
Creatinine, Ser: 1.21 mg/dL — ABNORMAL HIGH (ref 0.44–1.00)
GFR, Estimated: 42 mL/min — ABNORMAL LOW (ref >60)
Glucose, Bld: 108 mg/dL — ABNORMAL HIGH (ref 70–99)
Potassium: 4.3 mmol/L (ref 3.5–5.1)
Sodium: 128 mmol/L — ABNORMAL LOW (ref 135–145)

## 2023-10-05 LAB — MAGNESIUM: Magnesium: 2.4 mg/dL (ref 1.7–2.4)

## 2023-10-05 MED ORDER — QUETIAPINE FUMARATE 25 MG PO TABS
25.0000 mg | ORAL_TABLET | Freq: Every day | ORAL | Status: DC
  Administered 2023-10-05 – 2023-10-11 (×7): 25 mg via ORAL
  Filled 2023-10-05 (×7): qty 1

## 2023-10-05 NOTE — Progress Notes (Signed)
PROGRESS NOTE    Joanne Patterson  LOV:[id number] DOB: 1927-12-23 DOA: 10/02/2023 PCP: Jarrett Soho, PA-C   Brief Narrative:  89 yo female with PMH recurrent UTIs, hypertension, DM II, HLD who presented up on recommendation from Center well after outpatient urine culture came back positive. Patient's daughter also provided collateral information stating that the patient was continuing to become more confused and hallucinating prior to admission.  They had obtained urine specimen on 09/26/2023 and had not yet started/been prescribed antibiotics prior to hospitalization. UA on admission showed large LE, negative nitrite, greater than 50 WBC.  She was started on Rocephin and admitted for further workup and monitoring.  Assessment & Plan:   Principal Problem:   Acute cystitis Active Problems:   Hyponatremia   Essential hypertension   Hyperlipidemia   Chronic kidney disease, stage 3b (HCC)   Lower urinary tract infectious disease   UTI urine culture growing Klebsiella and Aerococcus urinae and Corynebacterium stratum Klebsiella sensitive to Rocephin - Main symptom appears to be hallucinations and altered mentation on admission.  Original urine specimen obtained 09/26/2023 outpatient and sent to [facility] for testing; appears patient told culture was positive on 11/25 and to go to ER - obtained fax report and is placed in chart; unfortunately only says "mixed urogenital flora, > 100k colonies). No other micro data sent - the repeat urine culture here already showing GNR sensitivity pending continue rocephin for now and follow culture  no blood cultures were obtained on admission and now with doses given, lower yield to obtain    Hyponatremia improving  - Mild and asymptomatic - Continue fluids and trend BMP   Chronic kidney disease, stage 3b (HCC) - patient has history of CKD3b. Baseline creat ~ 1.3 - 1.5, eGFR~ 35   Hyperlipidemia - Continue Zocor   Essential hypertension-hold  arb and lasix Start norvasc And hydralazine  Deep tissue pressure injury left heel present on admission  Pressure Injury 10/02/23 Heel Left Deep Tissue Pressure Injury - Purple or maroon localized area of discolored intact skin or blood-filled blister due to damage of underlying soft tissue from pressure and/or shear. (Active)  10/02/23 1809  Location: Heel  Location Orientation: Left  Staging: Deep Tissue Pressure Injury - Purple or maroon localized area of discolored intact skin or blood-filled blister due to damage of underlying soft tissue from pressure and/or shear.  Wound Description (Comments):   Present on Admission: Yes  Dressing Type None 10/05/23 0755     Estimated body mass index is 30.51 kg/m as calculated from the following:   Height as of this encounter: 5\' 6"  (1.676 m).   Weight as of this encounter: 85.7 kg.  DVT prophylaxis: lovenox Code Status: full Family Communication:dw daughter Disposition Plan:  Status is: Inpatient Remains inpatient appropriate because:    Consultants: none  Procedures: none Antimicrobials:rocephin  Subjective:  Patient has been restless Seroquel 12.5 put her to sleep only for 30 minutes apparently  She is trying to sleep this morning not had good sleep for 3 nights or 4 nights  Objective: Vitals:   10/04/23 1345 10/04/23 2028 10/05/23 0557 10/05/23 1353  BP: (!) 159/68 (!) 159/80 (!) 105/56 (!) 130/49  Pulse: 69 87 76 65  Resp: 14 16 16 16   Temp:      TempSrc:      SpO2: 98% 96% 100% 100%  Weight:      Height:        Intake/Output Summary (Last 24 hours) at 10/05/2023 1402 Last  data filed at 10/04/2023 1842 Gross per 24 hour  Intake 120 ml  Output 0 ml  Net 120 ml   Filed Weights   10/02/23 1230  Weight: 85.7 kg    Examination:  General exam: Appears to be hallucinating  Respiratory system: Clear to auscultation. Respiratory effort normal. Cardiovascular system: S1 & S2 heard, RRR. No JVD, murmurs, rubs,  gallops or clicks. No pedal edema. Gastrointestinal system: Abdomen is nondistended, soft and nontender. No organomegaly or masses felt. Normal bowel sounds heard. Central nervous system: confused hallucinating Moves all extremities Extremities:trace edema   Data Reviewed: I have personally reviewed following labs and imaging studies  CBC: Recent Labs  Lab 10/02/23 1324 10/03/23 0556 10/04/23 0555 10/05/23 0606  WBC 8.5 8.7 8.6 6.6  NEUTROABS 5.0  --  5.1 3.7  HGB 11.3* 9.8* 10.5* 10.6*  HCT 33.1* 30.8* 32.8* 32.9*  MCV 86.4 89.5 89.1 88.9  PLT 217 199 220 208   Basic Metabolic Panel: Recent Labs  Lab 10/02/23 1324 10/03/23 0556 10/04/23 0555 10/05/23 0606  NA 128* 130* 132* 128*  K 4.2 4.3 4.1 4.3  CL 98 101 100 98  CO2 24 20* 21* 19*  GLUCOSE 113* 175* 113* 108*  BUN 11 13 16 16   CREATININE 1.14* 1.19* 1.23* 1.21*  CALCIUM 9.7 9.4 10.1 9.9  MG  --   --  2.1 2.4   GFR: Estimated Creatinine Clearance: 32.1 mL/min (A) (by C-G formula based on SCr of 1.21 mg/dL (H)). Liver Function Tests: No results for input(s): "AST", "ALT", "ALKPHOS", "BILITOT", "PROT", "ALBUMIN" in the last 168 hours. No results for input(s): "LIPASE", "AMYLASE" in the last 168 hours. No results for input(s): "AMMONIA" in the last 168 hours. Coagulation Profile: No results for input(s): "INR", "PROTIME" in the last 168 hours. Cardiac Enzymes: No results for input(s): "CKTOTAL", "CKMB", "CKMBINDEX", "TROPONINI" in the last 168 hours. BNP (last 3 results) No results for input(s): "PROBNP" in the last 8760 hours. HbA1C: No results for input(s): "HGBA1C" in the last 72 hours. CBG: No results for input(s): "GLUCAP" in the last 168 hours. Lipid Profile: No results for input(s): "CHOL", "HDL", "LDLCALC", "TRIG", "CHOLHDL", "LDLDIRECT" in the last 72 hours. Thyroid Function Tests: No results for input(s): "TSH", "T4TOTAL", "FREET4", "T3FREE", "THYROIDAB" in the last 72 hours. Anemia Panel: No  results for input(s): "VITAMINB12", "FOLATE", "FERRITIN", "TIBC", "IRON", "RETICCTPCT" in the last 72 hours. Sepsis Labs: No results for input(s): "PROCALCITON", "LATICACIDVEN" in the last 168 hours.  Recent Results (from the past 240 hour(s))  Urine Culture     Status: Abnormal   Collection Time: 10/02/23  3:22 PM   Specimen: Urine, Clean Catch  Result Value Ref Range Status   Specimen Description   Final    URINE, CLEAN CATCH Performed at [facility], [address], [place], [place] [address]    Special Requests   Final    NONE Reflexed from [id number] Performed at [facility], [address], [place], [place] [address]    Culture (A)  Final    >=100,000 COLONIES/mL KLEBSIELLA PNEUMONIAE >=100,000 COLONIES/mL AEROCOCCUS URINAE    Report Status 10/05/2023 FINAL  Final   Organism ID, Bacteria KLEBSIELLA PNEUMONIAE (A)  Final      Susceptibility   Klebsiella pneumoniae - MIC*    AMPICILLIN >=32 RESISTANT Resistant     CEFAZOLIN <=4 SENSITIVE Sensitive     CEFEPIME <=0.12 SENSITIVE Sensitive     CEFTRIAXONE <=0.25 SENSITIVE Sensitive  CIPROFLOXACIN <=0.25 SENSITIVE Sensitive     GENTAMICIN <=1 SENSITIVE Sensitive     IMIPENEM <=0.25 SENSITIVE Sensitive     NITROFURANTOIN 32 SENSITIVE Sensitive     TRIMETH/SULFA <=20 SENSITIVE Sensitive     AMPICILLIN/SULBACTAM 4 SENSITIVE Sensitive     PIP/TAZO <=4 SENSITIVE Sensitive ug/mL    * >=100,000 COLONIES/mL KLEBSIELLA PNEUMONIAE     Radiology Studies: No results found.   Scheduled Meds:  amLODipine  10 mg Oral Daily   vitamin C  1,000 mg Oral Daily   aspirin EC  81 mg Oral Q M,W,F   enoxaparin (LOVENOX) injection  40 mg Subcutaneous Q24H   hydrALAZINE  25 mg Oral Q8H   multivitamin with minerals  1 tablet Oral Q breakfast   olopatadine  1 drop Both Eyes BID   QUEtiapine  12.5 mg Oral QHS   simvastatin  20 mg Oral QPM   Continuous Infusions:  cefTRIAXone (ROCEPHIN)  IV 1 g (10/04/23 1709)      LOS: 2 days    Time spent: 38 min  Alwyn Ren, MD 10/05/2023, 2:02 PM

## 2023-10-06 DIAGNOSIS — N390 Urinary tract infection, site not specified: Secondary | ICD-10-CM | POA: Diagnosis not present

## 2023-10-06 LAB — CBC WITH DIFFERENTIAL/PLATELET
Abs Immature Granulocytes: 0.04 10*3/uL (ref 0.00–0.07)
Basophils Absolute: 0 10*3/uL (ref 0.0–0.1)
Basophils Relative: 0 %
Eosinophils Absolute: 0.1 10*3/uL (ref 0.0–0.5)
Eosinophils Relative: 1 %
HCT: 29.2 % — ABNORMAL LOW (ref 36.0–46.0)
Hemoglobin: 9.5 g/dL — ABNORMAL LOW (ref 12.0–15.0)
Immature Granulocytes: 1 %
Lymphocytes Relative: 20 %
Lymphs Abs: 1.7 10*3/uL (ref 0.7–4.0)
MCH: 28.8 pg (ref 26.0–34.0)
MCHC: 32.5 g/dL (ref 30.0–36.0)
MCV: 88.5 fL (ref 80.0–100.0)
Monocytes Absolute: 0.7 10*3/uL (ref 0.1–1.0)
Monocytes Relative: 8 %
Neutro Abs: 6 10*3/uL (ref 1.7–7.7)
Neutrophils Relative %: 70 %
Platelets: 183 10*3/uL (ref 150–400)
RBC: 3.3 MIL/uL — ABNORMAL LOW (ref 3.87–5.11)
RDW: 15.4 % (ref 11.5–15.5)
WBC: 8.5 10*3/uL (ref 4.0–10.5)
nRBC: 0 % (ref 0.0–0.2)

## 2023-10-06 LAB — BASIC METABOLIC PANEL
Anion gap: 9 (ref 5–15)
BUN: 21 mg/dL (ref 8–23)
CO2: 22 mmol/L (ref 22–32)
Calcium: 9.6 mg/dL (ref 8.9–10.3)
Chloride: 99 mmol/L (ref 98–111)
Creatinine, Ser: 1.19 mg/dL — ABNORMAL HIGH (ref 0.44–1.00)
GFR, Estimated: 43 mL/min — ABNORMAL LOW (ref >60)
Glucose, Bld: 84 mg/dL (ref 70–99)
Potassium: 4.3 mmol/L (ref 3.5–5.1)
Sodium: 130 mmol/L — ABNORMAL LOW (ref 135–145)

## 2023-10-06 LAB — MAGNESIUM: Magnesium: 2.4 mg/dL (ref 1.7–2.4)

## 2023-10-06 NOTE — Progress Notes (Signed)
Physical Therapy Treatment Patient Details Name: Joanne Patterson MRN: [id number] DOB: 1928-04-05 Today's Date: 10/06/2023   History of Present Illness Pt is a 89 y.o. female admitted with UTI. PMH significant for HTN, type II DM, hyperlipidemia, recurrent UTI and hyponatremia, L heel wound, R ankle sprain 08/2023, falls    PT Comments  Pt on BSC on arrival with nursing staff.  Staff report increased difficulty with transfer.  Pt assisted with standing and transfer back to bed while nursing staff performed pericare. Pt appears very anxious with mobilizing which significantly limited her ability to participate today.  Daughter into room end of session and states this is not her baseline cognition (although she does have fear of falling prior to admission).  Daughter reports pt did not sleep Mon-Wed and has since received sleeping aide.  RN reports pt asleep most of the day today.  Pt requiring significantly more assist today compared to last session. Updated d/c recommendation due to current assist level and high fall risk.  Patient will benefit from continued inpatient follow up therapy, <3 hours/day.        If plan is discharge home, recommend the following: Assistance with cooking/housework;Assist for transportation;Help with stairs or ramp for entrance;A lot of help with walking and/or transfers;A lot of help with bathing/dressing/bathroom   Can travel by private vehicle        Equipment Recommendations  Rolling walker (2 wheels)    Recommendations for Other Services       Precautions / Restrictions Precautions Precautions: Fall     Mobility  Bed Mobility   Bed Mobility: Sit to Supine       Sit to supine: Mod assist   General bed mobility comments: assist for LEs onto bed, total assist to scoot up EOB    Transfers Overall transfer level: Needs assistance Equipment used: Rolling walker (2 wheels) Transfers: Sit to/from Stand, Bed to chair/wheelchair/BSC Sit to Stand:  Max assist, +2 physical assistance Stand pivot transfers: Max assist, +2 physical assistance         General transfer comment: multimodal cues for positioning and technique, assist to rise and steady (NT provided hygiene), pt appears very anxious despite encouragement and reassurance provided; pt struggling to follow multimodal cues due to anxiety and fatigue so safely assisted back to bed (RN pulled bed closer to pt for safety)    Ambulation/Gait                   Stairs             Wheelchair Mobility     Tilt Bed    Modified Rankin (Stroke Patients Only)       Balance Overall balance assessment: Needs assistance, History of Falls Sitting-balance support: Feet supported Sitting balance-Leahy Scale: Fair     Standing balance support: Reliant on assistive device for balance, During functional activity, Bilateral upper extremity supported Standing balance-Leahy Scale: Zero                              Cognition Arousal: Alert Behavior During Therapy: Anxious Overall Cognitive Status: Impaired/Different from baseline Area of Impairment: Following commands, Problem solving, Safety/judgement                 Orientation Level: Place, Time, Situation, Disoriented to     Following Commands: Follows one step commands with increased time Safety/Judgement: Decreased awareness of safety, Decreased awareness of deficits   Problem  Solving: Difficulty sequencing, Decreased initiation, Requires verbal cues, Requires tactile cues, Slow processing General Comments: pt appears very fearful of falling, required repeated cues with increased time; daughter arrived end of session and reports pt was fearful of falling before admission but able to ambulate, also states this is not pt's baseline cognition today - states pt didn't sleep Mon-Wed and she received sleeping aide so current cognition possibly due to this        Exercises      General Comments         Pertinent Vitals/Pain Pain Assessment Pain Assessment: No/denies pain Pain Location: no specific c/o pain Pain Intervention(s): Monitored during session, Repositioned    Home Living                          Prior Function            PT Goals (current goals can now be found in the care plan section) Progress towards PT goals: Progressing toward goals    Frequency    Min 1X/week      PT Plan      Co-evaluation              AM-PAC PT "6 Clicks" Mobility   Outcome Measure  Help needed turning from your back to your side while in a flat bed without using bedrails?: A Lot Help needed moving from lying on your back to sitting on the side of a flat bed without using bedrails?: A Lot Help needed moving to and from a bed to a chair (including a wheelchair)?: A Lot Help needed standing up from a chair using your arms (e.g., wheelchair or bedside chair)?: A Lot Help needed to walk in hospital room?: Total Help needed climbing 3-5 steps with a railing? : Total 6 Click Score: 10    End of Session Equipment Utilized During Treatment: Gait belt Activity Tolerance: Patient tolerated treatment well;Patient limited by fatigue Patient left: with call bell/phone within reach;with bed alarm set;in bed;with family/visitor present Nurse Communication: Mobility status PT Visit Diagnosis: Muscle weakness (generalized) (M62.81);Unsteadiness on feet (R26.81)     Time: 4782-9562 PT Time Calculation (min) (ACUTE ONLY): 14 min  Charges:    $Therapeutic Activity: 8-22 mins PT General Charges $$ ACUTE PT VISIT: 1 Visit                    Thomasene Mohair PT, DPT Physical Therapist Acute Rehabilitation Services Office: [phone]    [name] 10/06/2023, 4:21 PM

## 2023-10-06 NOTE — Plan of Care (Signed)

## 2023-10-06 NOTE — Progress Notes (Deleted)
Physical Therapy Treatment Patient Details Name: Joanne Patterson MRN: [id number] DOB: [birth year] Today's Date: 10/06/2023   History of Present Illness Pt is a 89 y.o. female admitted with UTI. PMH significant for HTN, type II DM, hyperlipidemia, recurrent UTI and hyponatremia, L heel wound, R ankle sprain 08/2023, falls    PT Comments  Pt very HOH and reports feeling better today.  Pt ambulated 120 ft in hallway with RW and contact guard assist for safety.  Pt's mobility and cognition appears improved compared to prior sessions.  Pt hopeful for d/c home soon.  Continue to recommend HHPT upon d/c.      If plan is discharge home, recommend the following: Assistance with cooking/housework;Assist for transportation;Help with stairs or ramp for entrance;A little help with walking and/or transfers;A little help with bathing/dressing/bathroom   Can travel by private vehicle        Equipment Recommendations  Rolling walker (2 wheels)    Recommendations for Other Services       Precautions / Restrictions Precautions Precautions: Fall Precaution Comments: very HOH     Mobility  Bed Mobility Overal bed mobility: Needs Assistance Bed Mobility: Supine to Sit, Sit to Supine     Supine to sit: Supervision, HOB elevated Sit to supine: Supervision, HOB elevated        Transfers Overall transfer level: Needs assistance Equipment used: Rolling walker (2 wheels) Transfers: Sit to/from Stand Sit to Stand: Contact guard assist           General transfer comment: CGA for safety, cues for hand placement and positioning    Ambulation/Gait Ambulation/Gait assistance: Contact guard assist Gait Distance (Feet): 120 Feet Assistive device: Rolling walker (2 wheels) Gait Pattern/deviations: Step-through pattern, Decreased stride length, Trunk flexed       General Gait Details: slow but steady with RW, no symptoms reported, distance to tolerance   Stairs              Wheelchair Mobility     Tilt Bed    Modified Rankin (Stroke Patients Only)       Balance Overall balance assessment: Needs assistance, History of Falls         Standing balance support: Reliant on assistive device for balance, During functional activity, Bilateral upper extremity supported Standing balance-Leahy Scale: Poor                              Cognition Arousal: Alert Behavior During Therapy: WFL for tasks assessed/performed Overall Cognitive Status: Within Functional Limits for tasks assessed                                 General Comments: able to state her age and appears appropriate however she stated some of her surgical medical history (from the 40s and 40s) but none listed in chart history (so uncertain of accuracy); no family present at time of session        Exercises      General Comments        Pertinent Vitals/Pain Pain Assessment Pain Assessment: No/denies pain Pain Intervention(s): Monitored during session, Repositioned    Home Living                          Prior Function            PT Goals (current goals can  now be found in the care plan section) Progress towards PT goals: Progressing toward goals    Frequency    Min 1X/week      PT Plan      Co-evaluation              AM-PAC PT "6 Clicks" Mobility   Outcome Measure  Help needed turning from your back to your side while in a flat bed without using bedrails?: A Little Help needed moving from lying on your back to sitting on the side of a flat bed without using bedrails?: A Little Help needed moving to and from a bed to a chair (including a wheelchair)?: A Little Help needed standing up from a chair using your arms (e.g., wheelchair or bedside chair)?: A Little Help needed to walk in hospital room?: A Little Help needed climbing 3-5 steps with a railing? : A Lot 6 Click Score: 17    End of Session Equipment Utilized  During Treatment: Gait belt Activity Tolerance: Patient tolerated treatment well Patient left: in bed;with call bell/phone within reach;with bed alarm set   PT Visit Diagnosis: Difficulty in walking, not elsewhere classified (R26.2);Muscle weakness (generalized) (M62.81)     Time: 2542-7062 PT Time Calculation (min) (ACUTE ONLY): 15 min  Charges:    $Gait Training: 8-22 mins PT General Charges $$ ACUTE PT VISIT: 1 Visit                    Paulino Door, DPT Physical Therapist Acute Rehabilitation Services Office: [phone]   Joanne Patterson 10/06/2023, 3:50 PM

## 2023-10-06 NOTE — Progress Notes (Signed)
PROGRESS NOTE    Joanne Patterson  VWU:[id number] DOB: 1928-03-21 DOA: 10/02/2023 PCP: Jarrett Soho, PA-C   Brief Narrative:  89 yo female with PMH recurrent UTIs, hypertension, DM II, HLD who presented up on recommendation from [facility] after outpatient urine culture came back positive. Patient's daughter also provided collateral information stating that the patient was continuing to become more confused and hallucinating prior to admission.  They had obtained urine specimen on 09/26/2023 and had not yet started/been prescribed antibiotics prior to hospitalization. UA on admission showed large LE, negative nitrite, greater than 50 WBC.  She was started on Rocephin and admitted for further workup and monitoring.  Assessment & Plan:   Principal Problem:   Acute cystitis Active Problems:   Hyponatremia   Essential hypertension   Hyperlipidemia   Chronic kidney disease, stage 3b (HCC)   Lower urinary tract infectious disease   UTI urine culture growing Klebsiella and Aerococcus urinae and Corynebacterium stratum Klebsiella sensitive to Rocephin - Main symptom appears to be hallucinations and altered mentation on admission.  Original urine specimen obtained 09/26/2023 outpatient and sent to [facility] for testing; appears patient told culture was positive on 11/25 and to go to ER - obtained fax report and is placed in chart; unfortunately only says "mixed urogenital flora, > 100k colonies). No other micro data sent  no blood cultures were obtained on admission and now with doses given, lower yield to obtain    Hyponatremia improving  - Mild and asymptomatic - Continue fluids and trend BMP   Chronic kidney disease, stage 3b (HCC) - patient has history of CKD3b. Baseline creat ~ 1.3 - 1.5, eGFR~ 35   Hyperlipidemia - Continue Zocor   Essential hypertension-hold arb and lasix Start norvasc And hydralazine  Deep tissue pressure injury left heel present on admission  Pressure  Injury 10/02/23 Heel Left Deep Tissue Pressure Injury - Purple or maroon localized area of discolored intact skin or blood-filled blister due to damage of underlying soft tissue from pressure and/or shear. (Active)  10/02/23 1809  Location: Heel  Location Orientation: Left  Staging: Deep Tissue Pressure Injury - Purple or maroon localized area of discolored intact skin or blood-filled blister due to damage of underlying soft tissue from pressure and/or shear.  Wound Description (Comments):   Present on Admission: Yes  Dressing Type None 10/05/23 2035     Estimated body mass index is 30.51 kg/m as calculated from the following:   Height as of this encounter: 5\' 6"  (1.676 m).   Weight as of this encounter: 85.7 kg.  DVT prophylaxis: lovenox Code Status: full Family Communication:dw daughter Disposition Plan:  Status is: Inpatient Remains inpatient appropriate because:    Consultants: none  Procedures: none Antimicrobials:rocephin  Subjective: She slept better last night daughter feels that she is improving  Objective: Vitals:   10/05/23 0557 10/05/23 1353 10/05/23 2038 10/06/23 0552  BP: (!) 105/56 (!) 130/49 (!) 150/58 (!) 144/57  Pulse: 76 65 69 73  Resp: 16 16 16 16   Temp:   (!) 97.4 F (36.3 C) (!) 97.3 F (36.3 C)  TempSrc:   Oral Oral  SpO2: 100% 100%  100%  Weight:      Height:       No intake or output data in the 24 hours ending 10/06/23 1223  Filed Weights   10/02/23 1230  Weight: 85.7 kg    Examination:  General exam: Resting in bed Respiratory system: Clear to auscultation. Respiratory effort normal. Cardiovascular system:  S1 & S2 heard, RRR. No JVD, murmurs, rubs, gallops or clicks. No pedal edema. Gastrointestinal system: Abdomen is nondistended, soft and nontender. No organomegaly or masses felt. Normal bowel sounds heard. Central nervous system: Sleeping  Extremities:trace edema   Data Reviewed: I have personally reviewed following labs and  imaging studies  CBC: Recent Labs  Lab 10/02/23 1324 10/03/23 0556 10/04/23 0555 10/05/23 0606 10/06/23 0600  WBC 8.5 8.7 8.6 6.6 8.5  NEUTROABS 5.0  --  5.1 3.7 6.0  HGB 11.3* 9.8* 10.5* 10.6* 9.5*  HCT 33.1* 30.8* 32.8* 32.9* 29.2*  MCV 86.4 89.5 89.1 88.9 88.5  PLT 217 199 220 208 183   Basic Metabolic Panel: Recent Labs  Lab 10/02/23 1324 10/03/23 0556 10/04/23 0555 10/05/23 0606 10/06/23 0600  NA 128* 130* 132* 128* 130*  K 4.2 4.3 4.1 4.3 4.3  CL 98 101 100 98 99  CO2 24 20* 21* 19* 22  GLUCOSE 113* 175* 113* 108* 84  BUN 11 13 16 16 21   CREATININE 1.14* 1.19* 1.23* 1.21* 1.19*  CALCIUM 9.7 9.4 10.1 9.9 9.6  MG  --   --  2.1 2.4 2.4   GFR: Estimated Creatinine Clearance: 32.6 mL/min (A) (by C-G formula based on SCr of 1.19 mg/dL (H)). Liver Function Tests: No results for input(s): "AST", "ALT", "ALKPHOS", "BILITOT", "PROT", "ALBUMIN" in the last 168 hours. No results for input(s): "LIPASE", "AMYLASE" in the last 168 hours. No results for input(s): "AMMONIA" in the last 168 hours. Coagulation Profile: No results for input(s): "INR", "PROTIME" in the last 168 hours. Cardiac Enzymes: No results for input(s): "CKTOTAL", "CKMB", "CKMBINDEX", "TROPONINI" in the last 168 hours. BNP (last 3 results) No results for input(s): "PROBNP" in the last 8760 hours. HbA1C: No results for input(s): "HGBA1C" in the last 72 hours. CBG: No results for input(s): "GLUCAP" in the last 168 hours. Lipid Profile: No results for input(s): "CHOL", "HDL", "LDLCALC", "TRIG", "CHOLHDL", "LDLDIRECT" in the last 72 hours. Thyroid Function Tests: No results for input(s): "TSH", "T4TOTAL", "FREET4", "T3FREE", "THYROIDAB" in the last 72 hours. Anemia Panel: No results for input(s): "VITAMINB12", "FOLATE", "FERRITIN", "TIBC", "IRON", "RETICCTPCT" in the last 72 hours. Sepsis Labs: No results for input(s): "PROCALCITON", "LATICACIDVEN" in the last 168 hours.  Recent Results (from the past  240 hour(s))  Urine Culture     Status: Abnormal   Collection Time: 10/02/23  3:22 PM   Specimen: Urine, Clean Catch  Result Value Ref Range Status   Specimen Description   Final    URINE, CLEAN CATCH Performed at [facility], [address], [place], [place] [address]    Special Requests   Final    NONE Reflexed from [id number] Performed at [facility], [address], [place], [place] [address]    Culture (A)  Final    >=100,000 COLONIES/mL KLEBSIELLA PNEUMONIAE >=100,000 COLONIES/mL AEROCOCCUS URINAE    Report Status 10/05/2023 FINAL  Final   Organism ID, Bacteria KLEBSIELLA PNEUMONIAE (A)  Final      Susceptibility   Klebsiella pneumoniae - MIC*    AMPICILLIN >=32 RESISTANT Resistant     CEFAZOLIN <=4 SENSITIVE Sensitive     CEFEPIME <=0.12 SENSITIVE Sensitive     CEFTRIAXONE <=0.25 SENSITIVE Sensitive     CIPROFLOXACIN <=0.25 SENSITIVE Sensitive     GENTAMICIN <=1 SENSITIVE Sensitive     IMIPENEM <=0.25 SENSITIVE Sensitive     NITROFURANTOIN 32 SENSITIVE Sensitive     TRIMETH/SULFA <=20 SENSITIVE Sensitive  AMPICILLIN/SULBACTAM 4 SENSITIVE Sensitive     PIP/TAZO <=4 SENSITIVE Sensitive ug/mL    * >=100,000 COLONIES/mL KLEBSIELLA PNEUMONIAE     Radiology Studies: No results found.   Scheduled Meds:  amLODipine  10 mg Oral Daily   vitamin C  1,000 mg Oral Daily   aspirin EC  81 mg Oral Q M,W,F   enoxaparin (LOVENOX) injection  40 mg Subcutaneous Q24H   hydrALAZINE  25 mg Oral Q8H   multivitamin with minerals  1 tablet Oral Q breakfast   olopatadine  1 drop Both Eyes BID   QUEtiapine  25 mg Oral QHS   simvastatin  20 mg Oral QPM   Continuous Infusions:  cefTRIAXone (ROCEPHIN)  IV 1 g (10/05/23 1643)     LOS: 3 days    Time spent: 38 min  Alwyn Ren, MD 10/06/2023, 12:23 PM

## 2023-10-06 NOTE — Plan of Care (Signed)
  Problem: Nutrition: Goal: Adequate nutrition will be maintained Outcome: Progressing   Problem: Coping: Goal: Level of anxiety will decrease Outcome: Progressing   Problem: Elimination: Goal: Will not experience complications related to bowel motility Outcome: Progressing Goal: Will not experience complications related to urinary retention Outcome: Progressing   Problem: Pain Management: Goal: General experience of comfort will improve Outcome: Progressing   Problem: Safety: Goal: Ability to remain free from injury will improve Outcome: Progressing

## 2023-10-07 DIAGNOSIS — N390 Urinary tract infection, site not specified: Secondary | ICD-10-CM | POA: Diagnosis not present

## 2023-10-07 LAB — CBC WITH DIFFERENTIAL/PLATELET
Abs Immature Granulocytes: 0.02 10*3/uL (ref 0.00–0.07)
Basophils Absolute: 0 10*3/uL (ref 0.0–0.1)
Basophils Relative: 0 %
Eosinophils Absolute: 0.1 10*3/uL (ref 0.0–0.5)
Eosinophils Relative: 2 %
HCT: 27.5 % — ABNORMAL LOW (ref 36.0–46.0)
Hemoglobin: 8.9 g/dL — ABNORMAL LOW (ref 12.0–15.0)
Immature Granulocytes: 0 %
Lymphocytes Relative: 28 %
Lymphs Abs: 2 10*3/uL (ref 0.7–4.0)
MCH: 28.9 pg (ref 26.0–34.0)
MCHC: 32.4 g/dL (ref 30.0–36.0)
MCV: 89.3 fL (ref 80.0–100.0)
Monocytes Absolute: 0.6 10*3/uL (ref 0.1–1.0)
Monocytes Relative: 9 %
Neutro Abs: 4.3 10*3/uL (ref 1.7–7.7)
Neutrophils Relative %: 61 %
Platelets: 179 10*3/uL (ref 150–400)
RBC: 3.08 MIL/uL — ABNORMAL LOW (ref 3.87–5.11)
RDW: 15.8 % — ABNORMAL HIGH (ref 11.5–15.5)
WBC: 7.1 10*3/uL (ref 4.0–10.5)
nRBC: 0 % (ref 0.0–0.2)

## 2023-10-07 LAB — BASIC METABOLIC PANEL
Anion gap: 9 (ref 5–15)
BUN: 20 mg/dL (ref 8–23)
CO2: 20 mmol/L — ABNORMAL LOW (ref 22–32)
Calcium: 9.2 mg/dL (ref 8.9–10.3)
Chloride: 101 mmol/L (ref 98–111)
Creatinine, Ser: 1.26 mg/dL — ABNORMAL HIGH (ref 0.44–1.00)
GFR, Estimated: 40 mL/min — ABNORMAL LOW (ref >60)
Glucose, Bld: 75 mg/dL (ref 70–99)
Potassium: 4.4 mmol/L (ref 3.5–5.1)
Sodium: 130 mmol/L — ABNORMAL LOW (ref 135–145)

## 2023-10-07 LAB — MAGNESIUM: Magnesium: 2.5 mg/dL — ABNORMAL HIGH (ref 1.7–2.4)

## 2023-10-07 NOTE — Plan of Care (Signed)

## 2023-10-07 NOTE — Plan of Care (Signed)
   Problem: Clinical Measurements: Goal: Ability to maintain clinical measurements within normal limits will improve Outcome: Progressing Goal: Will remain free from infection Outcome: Progressing Goal: Diagnostic test results will improve Outcome: Progressing Goal: Respiratory complications will improve Outcome: Progressing Goal: Cardiovascular complication will be avoided Outcome: Progressing   Problem: Activity: Goal: Risk for activity intolerance will decrease Outcome: Progressing   Problem: Nutrition: Goal: Adequate nutrition will be maintained Outcome: Progressing   Problem: Coping: Goal: Level of anxiety will decrease Outcome: Progressing   Problem: Pain Management: Goal: General experience of comfort will improve Outcome: Progressing   Problem: Safety: Goal: Ability to remain free from injury will improve Outcome: Progressing   Problem: Skin Integrity: Goal: Risk for impaired skin integrity will decrease Outcome: Progressing

## 2023-10-07 NOTE — Progress Notes (Signed)
**Note Joanne Patterson** PROGRESS NOTE    [name]  [id number]:[id number] DOB: 21-Sep-1928 DOA: 10/02/2023 PCP: Jarrett Soho, PA-C   Brief Narrative:  89 yo female with PMH recurrent UTIs, hypertension, DM II, HLD who presented up on recommendation from [facility] after outpatient urine culture came back positive. Patient's daughter also provided collateral information stating that the patient was continuing to become more confused and hallucinating prior to admission.  They had obtained urine specimen on 09/26/2023 and had not yet started/been prescribed antibiotics prior to hospitalization. UA on admission showed large LE, negative nitrite, greater than 50 WBC.  She was started on Rocephin and admitted for further workup and monitoring.  Assessment & Plan:   Principal Problem:   Acute cystitis Active Problems:   Hyponatremia   Essential hypertension   Hyperlipidemia   Chronic kidney disease, stage 3b (HCC)   Lower urinary tract infectious disease   UTI urine culture growing Klebsiella and Aerococcus urinae and Corynebacterium stratum Klebsiella sensitive to Rocephin - Main symptom appears to be hallucinations and altered mentation on admission.  Original urine specimen obtained 09/26/2023 outpatient and sent to [facility] for testing; appears patient told culture was positive on 11/25 and to go to ER - obtained fax report and is placed in chart; unfortunately only says "mixed urogenital flora, > 100k colonies). No other micro data sent  no blood cultures were obtained on admission and now with doses given, lower yield to obtain    Hyponatremia improving  - Mild and asymptomatic - Continue fluids and trend BMP   Chronic kidney disease, stage 3b (HCC) - patient has history of CKD3b. Baseline creat ~ 1.3 - 1.5, eGFR~ 35   Hyperlipidemia - Continue Zocor   Essential hypertension-hold arb and lasix Start norvasc And hydralazine  Deep tissue pressure injury left heel present on admission  Pressure  Injury 10/02/23 Heel Left Deep Tissue Pressure Injury - Purple or maroon localized area of discolored intact skin or blood-filled blister due to damage of underlying soft tissue from pressure and/or shear. (Active)  10/02/23 1809  Location: Heel  Location Orientation: Left  Staging: Deep Tissue Pressure Injury - Purple or maroon localized area of discolored intact skin or blood-filled blister due to damage of underlying soft tissue from pressure and/or shear.  Wound Description (Comments):   Present on Admission: Yes  Dressing Type Foam - Lift dressing to assess site every shift 10/06/23 2000     Estimated body mass index is 30.51 kg/m as calculated from the following:   Height as of this encounter: 5\' 6"  (1.676 m).   Weight as of this encounter: 85.7 kg.  DVT prophylaxis: lovenox Code Status: full Family Communication:dw daughter Disposition Plan:  Status is: Inpatient Remains inpatient appropriate because:    Consultants: none  Procedures: none Antimicrobials:rocephin  Subjective: She slept better last night daughter feels that she is improving  Objective: Vitals:   10/06/23 2049 10/07/23 0506 10/07/23 0849 10/07/23 1305  BP: (!) 158/70 (!) 151/42 (!) 145/41 (!) 129/52  Pulse: 74 65 64 74  Resp: 14   18  Temp: (!) 97.4 F (36.3 C) 97.6 F (36.4 C)  (!) 97.4 F (36.3 C)  TempSrc: Oral Oral  Oral  SpO2: 99% 100%  100%  Weight:      Height:        Intake/Output Summary (Last 24 hours) at 10/07/2023 1353 Last data filed at 10/07/2023 0616 Gross per 24 hour  Intake 540 ml  Output --  Net 540 ml  Filed Weights   10/02/23 1230  Weight: 85.7 kg    Examination:  General exam: Resting in bed Respiratory system: Clear to auscultation. Respiratory effort normal. Cardiovascular system: S1 & S2 heard, RRR. No JVD, murmurs, rubs, gallops or clicks. No pedal edema. Gastrointestinal system: Abdomen is nondistended, soft and nontender. No organomegaly or masses felt.  Normal bowel sounds heard. Central nervous system: Sleeping  Extremities:trace edema   Data Reviewed: I have personally reviewed following labs and imaging studies  CBC: Recent Labs  Lab 10/02/23 1324 10/03/23 0556 10/04/23 0555 10/05/23 0606 10/06/23 0600 10/07/23 0605  WBC 8.5 8.7 8.6 6.6 8.5 7.1  NEUTROABS 5.0  --  5.1 3.7 6.0 4.3  HGB 11.3* 9.8* 10.5* 10.6* 9.5* 8.9*  HCT 33.1* 30.8* 32.8* 32.9* 29.2* 27.5*  MCV 86.4 89.5 89.1 88.9 88.5 89.3  PLT 217 199 220 208 183 179   Basic Metabolic Panel: Recent Labs  Lab 10/03/23 0556 10/04/23 0555 10/05/23 0606 10/06/23 0600 10/07/23 0605  NA 130* 132* 128* 130* 130*  K 4.3 4.1 4.3 4.3 4.4  CL 101 100 98 99 101  CO2 20* 21* 19* 22 20*  GLUCOSE 175* 113* 108* 84 75  BUN 13 16 16 21 20   CREATININE 1.19* 1.23* 1.21* 1.19* 1.26*  CALCIUM 9.4 10.1 9.9 9.6 9.2  MG  --  2.1 2.4 2.4 2.5*   GFR: Estimated Creatinine Clearance: 30.8 mL/min (A) (by C-G formula based on SCr of 1.26 mg/dL (H)). Liver Function Tests: No results for input(s): "AST", "ALT", "ALKPHOS", "BILITOT", "PROT", "ALBUMIN" in the last 168 hours. No results for input(s): "LIPASE", "AMYLASE" in the last 168 hours. No results for input(s): "AMMONIA" in the last 168 hours. Coagulation Profile: No results for input(s): "INR", "PROTIME" in the last 168 hours. Cardiac Enzymes: No results for input(s): "CKTOTAL", "CKMB", "CKMBINDEX", "TROPONINI" in the last 168 hours. BNP (last 3 results) No results for input(s): "PROBNP" in the last 8760 hours. HbA1C: No results for input(s): "HGBA1C" in the last 72 hours. CBG: No results for input(s): "GLUCAP" in the last 168 hours. Lipid Profile: No results for input(s): "CHOL", "HDL", "LDLCALC", "TRIG", "CHOLHDL", "LDLDIRECT" in the last 72 hours. Thyroid Function Tests: No results for input(s): "TSH", "T4TOTAL", "FREET4", "T3FREE", "THYROIDAB" in the last 72 hours. Anemia Panel: No results for input(s): "VITAMINB12",  "FOLATE", "FERRITIN", "TIBC", "IRON", "RETICCTPCT" in the last 72 hours. Sepsis Labs: No results for input(s): "PROCALCITON", "LATICACIDVEN" in the last 168 hours.  Recent Results (from the past 240 hour(s))  Urine Culture     Status: Abnormal   Collection Time: 10/02/23  3:22 PM   Specimen: Urine, Clean Catch  Result Value Ref Range Status   Specimen Description   Final    URINE, CLEAN CATCH Performed at [facility], [address] [place], [place] [address]    Special Requests   Final    NONE Reflexed from [id number] Performed at [facility], [address] [place], [place] [address]    Culture (A)  Final    >=100,000 COLONIES/mL KLEBSIELLA PNEUMONIAE >=100,000 COLONIES/mL AEROCOCCUS URINAE    Report Status 10/05/2023 FINAL  Final   Organism ID, Bacteria KLEBSIELLA PNEUMONIAE (A)  Final      Susceptibility   Klebsiella pneumoniae - MIC*    AMPICILLIN >=32 RESISTANT Resistant     CEFAZOLIN <=4 SENSITIVE Sensitive     CEFEPIME <=0.12 SENSITIVE Sensitive     CEFTRIAXONE <=0.25 SENSITIVE Sensitive     CIPROFLOXACIN <=0.25 SENSITIVE  Sensitive     GENTAMICIN <=1 SENSITIVE Sensitive     IMIPENEM <=0.25 SENSITIVE Sensitive     NITROFURANTOIN 32 SENSITIVE Sensitive     TRIMETH/SULFA <=20 SENSITIVE Sensitive     AMPICILLIN/SULBACTAM 4 SENSITIVE Sensitive     PIP/TAZO <=4 SENSITIVE Sensitive ug/mL    * >=100,000 COLONIES/mL KLEBSIELLA PNEUMONIAE     Radiology Studies: No results found.   Scheduled Meds:  amLODipine  10 mg Oral Daily   vitamin C  1,000 mg Oral Daily   aspirin EC  81 mg Oral Q M,W,F   enoxaparin (LOVENOX) injection  40 mg Subcutaneous Q24H   hydrALAZINE  25 mg Oral Q8H   multivitamin with minerals  1 tablet Oral Q breakfast   olopatadine  1 drop Both Eyes BID   QUEtiapine  25 mg Oral QHS   simvastatin  20 mg Oral QPM   Continuous Infusions:  cefTRIAXone (ROCEPHIN)  IV Stopped (10/06/23 1734)     LOS: 4 days    Time spent: 38  min  Alwyn Ren, MD 10/07/2023, 1:53 PM

## 2023-10-07 NOTE — TOC Initial Note (Addendum)
Transition of Care [facility]) - Initial/Assessment Note    Patient Details  Name: Joanne Patterson MRN: [id number] Date of Birth: July 03, 1928  Transition of [facility]) CM/SW Contact:    Darleene Cleaver, LCSW Phone Number: 10/07/2023, 5:53 PM  Clinical Narrative:                  Patient has dementia, and confusion, CSW completed assessment by speaking to patient's daughter Waynetta Sandy [phone], and chart review.  Patient is a 89 year old female who is alert and oriented x1.  Patient lives with her daughter, and currently receiving HH PT, OT, and RN, from [facility].    CSW spoke to patient's daughter and informed her that PT is recommending SNF placement for short term rehab.    Per patient's daughter does not want patient to go to SNF.    Per patient's daughter she is currently receiving PT, OT, and RN services at home from [facility].  Per patient's daughter she has been happy with the therapy, but have not been satisfied with the nurses.  CSW advised that she speak to the administrator to address her concerns.  Patient's daughter said she has spoken to the administrator already.  CSW discussed any DME needs, per patient's daughter during last hospitalization in October patient received a wheelchair and bedside commode from [facility].  Per patient's daughter she would like a rolling walker.  CSW informed her that because patient received a wheelchair last hospital admission, patient will not be eligible for a walker through insurance, but she can pay private for one.  Patient's daughter expressed understanding, CSW informed her to check online to see what is available, and also CSW can have [employer] call her and tell her how much it would be for the rolling walker under private pay.  Patient's daughter said that would be fine to have [employer] call her and discuss cost.  Per patient's daughter she will need EMS transport back home.  CSW attempted to contact [facility] to inform them about patient's  daughters concerns, but unable to leave a message will follow up at a later time.  TOC to continue to follow patient's progress throughout discharge planning.  Expected Discharge Plan: Home w Home Health Services Barriers to Discharge: Continued Medical Work up   Patient Goals and CMS Choice Patient states their goals for this hospitalization and ongoing recovery are:: To return back home with home health. CMS Medicare.gov Compare Post Acute Care list provided to:: Patient Represenative (must comment) (Daughter [name]) Choice offered to / list presented to : Adult Children [place] ownership interest in [facility].provided to:: Adult Children    Expected Discharge Plan and Services     Post Acute Care Choice: Durable Medical Equipment, Home Health Living arrangements for the past 2 months: Single Family Home                 DME Arranged: Walker rolling DME Agency: [employer]         HH Agency: [facility]        Prior Living Arrangements/Services Living arrangements for the past 2 months: Single Family Home Lives with:: Adult Children Patient language and need for interpreter reviewed:: Yes Do you feel safe going back to the place where you live?: Yes      Need for Family Participation in Patient Care: No (Comment) Care giver support system in place?: No (comment) Current home services: Home OT, Home PT, Home RN Criminal Activity/Legal Involvement Pertinent  to Current Situation/Hospitalization: No - Comment as needed  Activities of Daily Living   ADL Screening (condition at time of admission) Independently performs ADLs?: No Does the patient have a NEW difficulty with bathing/dressing/toileting/self-feeding that is expected to last >3 days?: Yes (Initiates electronic notice to provider for possible OT consult) Does the patient have a NEW difficulty with getting in/out of bed, walking, or climbing stairs that is expected to last >3 days?:  Yes (Initiates electronic notice to provider for possible PT consult) Does the patient have a NEW difficulty with communication that is expected to last >3 days?: Yes (Initiates electronic notice to provider for possible SLP consult) Is the patient deaf or have difficulty hearing?: No Does the patient have difficulty seeing, even when wearing glasses/contacts?: Yes Does the patient have difficulty concentrating, remembering, or making decisions?: Yes  Permission Sought/Granted Permission sought to share information with : Family Supports Permission granted to share information with : Yes, Release of Information Signed, Yes, Verbal Permission Granted  Share Information with NAMETherisa Doyne Daughter [phone]  [phone]  Permission granted to share info w AGENCY: Home Health        Emotional Assessment Appearance:: Appears stated age Attitude/Demeanor/Rapport: Other (comment) (Confused) Affect (typically observed): Stable, Appropriate Orientation: : Oriented to Self Alcohol / Substance Use: Not Applicable Psych Involvement: No (comment)  Admission diagnosis:  Lower urinary tract infectious disease [N39.0] Hyponatremia [E87.1] UTI (urinary tract infection) [N39.0] Acute cystitis [N30.00] Patient Active Problem List   Diagnosis Date Noted   Lower urinary tract infectious disease 10/04/2023   Acute cystitis 10/02/2023   Abnormal LFTs 08/29/2023   Hyponatremia 08/16/2023   Essential hypertension 08/16/2023   Hyperlipidemia 08/16/2023   Chronic kidney disease, stage 3b (HCC) 08/16/2023   PCP:  Jarrett Soho, PA-C Pharmacy:   [id number] [place], [place] - [address] AT [facility] OF [facility] & [address] [place] [address] Phone: [phone] Fax: [phone]     Social Determinants of Health (SDOH) Social History: SDOH Screenings   Food Insecurity: No Food Insecurity (10/02/2023)  Housing: Low Risk   (10/02/2023)  Transportation Needs: Unmet Transportation Needs (10/02/2023)  Utilities: Not At Risk (10/02/2023)  Tobacco Use: Low Risk  (10/02/2023)   SDOH Interventions:     Readmission Risk Interventions     No data to display

## 2023-10-08 DIAGNOSIS — E871 Hypo-osmolality and hyponatremia: Secondary | ICD-10-CM | POA: Diagnosis not present

## 2023-10-08 LAB — CBC WITH DIFFERENTIAL/PLATELET
Abs Immature Granulocytes: 0.04 10*3/uL (ref 0.00–0.07)
Basophils Absolute: 0 10*3/uL (ref 0.0–0.1)
Basophils Relative: 0 %
Eosinophils Absolute: 0.1 10*3/uL (ref 0.0–0.5)
Eosinophils Relative: 1 %
HCT: 29.1 % — ABNORMAL LOW (ref 36.0–46.0)
Hemoglobin: 9.7 g/dL — ABNORMAL LOW (ref 12.0–15.0)
Immature Granulocytes: 1 %
Lymphocytes Relative: 26 %
Lymphs Abs: 2.1 10*3/uL (ref 0.7–4.0)
MCH: 28.6 pg (ref 26.0–34.0)
MCHC: 33.3 g/dL (ref 30.0–36.0)
MCV: 85.8 fL (ref 80.0–100.0)
Monocytes Absolute: 0.7 10*3/uL (ref 0.1–1.0)
Monocytes Relative: 9 %
Neutro Abs: 5.1 10*3/uL (ref 1.7–7.7)
Neutrophils Relative %: 63 %
Platelets: 204 10*3/uL (ref 150–400)
RBC: 3.39 MIL/uL — ABNORMAL LOW (ref 3.87–5.11)
RDW: 15.6 % — ABNORMAL HIGH (ref 11.5–15.5)
WBC: 8 10*3/uL (ref 4.0–10.5)
nRBC: 0 % (ref 0.0–0.2)

## 2023-10-08 LAB — BASIC METABOLIC PANEL
Anion gap: 7 (ref 5–15)
BUN: 21 mg/dL (ref 8–23)
CO2: 22 mmol/L (ref 22–32)
Calcium: 9.4 mg/dL (ref 8.9–10.3)
Chloride: 98 mmol/L (ref 98–111)
Creatinine, Ser: 1.26 mg/dL — ABNORMAL HIGH (ref 0.44–1.00)
GFR, Estimated: 40 mL/min — ABNORMAL LOW (ref >60)
Glucose, Bld: 84 mg/dL (ref 70–99)
Potassium: 4.7 mmol/L (ref 3.5–5.1)
Sodium: 127 mmol/L — ABNORMAL LOW (ref 135–145)

## 2023-10-08 LAB — MAGNESIUM: Magnesium: 2.3 mg/dL (ref 1.7–2.4)

## 2023-10-08 MED ORDER — SODIUM CHLORIDE 1 G PO TABS
1.0000 g | ORAL_TABLET | Freq: Three times a day (TID) | ORAL | Status: DC
  Administered 2023-10-08 – 2023-10-15 (×21): 1 g via ORAL
  Filled 2023-10-08 (×20): qty 1

## 2023-10-08 NOTE — TOC Progression Note (Signed)
Transition of Care [facility]) - Progression Note    Patient Details  Name: Joanne Patterson MRN: [id number] Date of Birth: 03-17-1928  Transition of [facility] CM/SW Contact  Darleene Cleaver, [place] Phone Number: 10/08/2023, 4:16 PM  Clinical Narrative:     CSW updated Katina from [facility] about patient's daughter's concerns with [facility] nursing.  She will update the weekend admin on call to follow up with patient's daughter.  CSW also spoke to [place] at [address] to ask if he would contact patient's daughter to give her a quote on a rolling walker.  Per Vaughan Basta, he will call daughter and give her the price for a private pay rolling walker.  TOC to continue to follow patient throughout discharge planning.   Expected Discharge Plan: Home w Home Health Services Barriers to Discharge: Continued Medical Work up  Expected Discharge Plan and Services   Home with home health PT, OT, nursing, and social worker.   Post Acute Care Choice: Durable Medical Equipment, Home Health Living arrangements for the past 2 months: Single Family Home                 DME Arranged: Walker rolling DME Agency: [employer]         HH Agency: [facility]         Social Determinants of Health (SDOH) Interventions SDOH Screenings   Food Insecurity: No Food Insecurity (10/02/2023)  Housing: Low Risk  (10/02/2023)  Transportation Needs: No Transportation Needs (10/07/2023)  Recent Concern: Transportation Needs - Unmet Transportation Needs (10/02/2023)  Utilities: Not At Risk (10/02/2023)  Tobacco Use: Low Risk  (10/02/2023)    Readmission Risk Interventions     No data to display

## 2023-10-08 NOTE — Progress Notes (Signed)
PROGRESS NOTE    Joanne Patterson  ZOX:[id number] DOB: 08/17/1928 DOA: 10/02/2023 PCP: Jarrett Soho, PA-C   Brief Narrative:  89 yo female with PMH recurrent UTIs, hypertension, DM II, HLD who presented up on recommendation from [facility] after outpatient urine culture came back positive. Patient's daughter also provided collateral information stating that the patient was continuing to become more confused and hallucinating prior to admission.  They had obtained urine specimen on 09/26/2023 and had not yet started/been prescribed antibiotics prior to hospitalization. UA on admission showed large LE, negative nitrite, greater than 50 WBC.  She was started on Rocephin and admitted for further workup and monitoring.  Assessment & Plan:   Principal Problem:   Acute cystitis Active Problems:   Hyponatremia   Essential hypertension   Hyperlipidemia   Chronic kidney disease, stage 3b (HCC)   Lower urinary tract infectious disease   UTI urine culture growing Klebsiella and Aerococcus urinae and Corynebacterium stratum Klebsiella sensitive to Rocephin - Main symptom appears to be hallucinations and altered mentation on admission.  Original urine specimen obtained 09/26/2023 outpatient and sent to [facility] for testing; appears patient told culture was positive on 11/25 and to go to ER - obtained fax report and is placed in chart; unfortunately only says "mixed urogenital flora, > 100k colonies). No other micro data sent  no blood cultures were obtained on admission and now with doses given, lower yield to obtain  Plan for home tomorrow.   Hyponatremia-her sodium has been up and down during this hospital stay currently it is 127.  Will start sodium chloride tablets and follow-up levels in a.m.  Chronic kidney disease, stage 3b (HCC) - patient has history of CKD3b. Baseline creat ~ 1.3 - 1.5, eGFR~ 35   Hyperlipidemia - Continue Zocor   Essential hypertension-hold arb and lasix Start  norvasc And hydralazine  Deep tissue pressure injury left heel present on admission  Pressure Injury 10/02/23 Heel Left Deep Tissue Pressure Injury - Purple or maroon localized area of discolored intact skin or blood-filled blister due to damage of underlying soft tissue from pressure and/or shear. (Active)  10/02/23 1809  Location: Heel  Location Orientation: Left  Staging: Deep Tissue Pressure Injury - Purple or maroon localized area of discolored intact skin or blood-filled blister due to damage of underlying soft tissue from pressure and/or shear.  Wound Description (Comments):   Present on Admission: Yes  Dressing Type Foam - Lift dressing to assess site every shift 10/07/23 2138     Estimated body mass index is 30.51 kg/m as calculated from the following:   Height as of this encounter: 5\' 6"  (1.676 m).   Weight as of this encounter: 85.7 kg.  DVT prophylaxis: lovenox Code Status: full Family Communication:dw daughter Disposition Plan:  Status is: Inpatient Remains inpatient appropriate because:    Consultants: none  Procedures: none Antimicrobials:rocephin  Subjective:   She is awake  smiling answering questions appropriately Objective: Vitals:   10/07/23 1305 10/07/23 2026 10/08/23 0448 10/08/23 1040  BP: (!) 129/52 (!) 141/83 (!) 132/52 (!) 177/68  Pulse: 74 70 76   Resp: 18 16 16    Temp: (!) 97.4 F (36.3 C) (!) 97.5 F (36.4 C) 98 F (36.7 C)   TempSrc: Oral Oral Oral   SpO2: 100% 96% 98%   Weight:      Height:        Intake/Output Summary (Last 24 hours) at 10/08/2023 1147 Last data filed at 10/07/2023 1714 Gross per 24  hour  Intake 100 ml  Output --  Net 100 ml    Filed Weights   10/02/23 1230  Weight: 85.7 kg    Examination:  General exam: Resting in bed Respiratory system: Clear to auscultation. Respiratory effort normal. Cardiovascular system: S1 & S2 heard, RRR. No JVD, murmurs, rubs, gallops or clicks. No pedal  edema. Gastrointestinal system: Abdomen is nondistended, soft and nontender. No organomegaly or masses felt. Normal bowel sounds heard. Central nervous system: Sleeping  Extremities:trace edema   Data Reviewed: I have personally reviewed following labs and imaging studies  CBC: Recent Labs  Lab 10/04/23 0555 10/05/23 0606 10/06/23 0600 10/07/23 0605 10/08/23 0542  WBC 8.6 6.6 8.5 7.1 8.0  NEUTROABS 5.1 3.7 6.0 4.3 5.1  HGB 10.5* 10.6* 9.5* 8.9* 9.7*  HCT 32.8* 32.9* 29.2* 27.5* 29.1*  MCV 89.1 88.9 88.5 89.3 85.8  PLT 220 208 183 179 204   Basic Metabolic Panel: Recent Labs  Lab 10/04/23 0555 10/05/23 0606 10/06/23 0600 10/07/23 0605 10/08/23 0542  NA 132* 128* 130* 130* 127*  K 4.1 4.3 4.3 4.4 4.7  CL 100 98 99 101 98  CO2 21* 19* 22 20* 22  GLUCOSE 113* 108* 84 75 84  BUN 16 16 21 20 21   CREATININE 1.23* 1.21* 1.19* 1.26* 1.26*  CALCIUM 10.1 9.9 9.6 9.2 9.4  MG 2.1 2.4 2.4 2.5* 2.3   GFR: Estimated Creatinine Clearance: 30.8 mL/min (A) (by C-G formula based on SCr of 1.26 mg/dL (H)). Liver Function Tests: No results for input(s): "AST", "ALT", "ALKPHOS", "BILITOT", "PROT", "ALBUMIN" in the last 168 hours. No results for input(s): "LIPASE", "AMYLASE" in the last 168 hours. No results for input(s): "AMMONIA" in the last 168 hours. Coagulation Profile: No results for input(s): "INR", "PROTIME" in the last 168 hours. Cardiac Enzymes: No results for input(s): "CKTOTAL", "CKMB", "CKMBINDEX", "TROPONINI" in the last 168 hours. BNP (last 3 results) No results for input(s): "PROBNP" in the last 8760 hours. HbA1C: No results for input(s): "HGBA1C" in the last 72 hours. CBG: No results for input(s): "GLUCAP" in the last 168 hours. Lipid Profile: No results for input(s): "CHOL", "HDL", "LDLCALC", "TRIG", "CHOLHDL", "LDLDIRECT" in the last 72 hours. Thyroid Function Tests: No results for input(s): "TSH", "T4TOTAL", "FREET4", "T3FREE", "THYROIDAB" in the last 72  hours. Anemia Panel: No results for input(s): "VITAMINB12", "FOLATE", "FERRITIN", "TIBC", "IRON", "RETICCTPCT" in the last 72 hours. Sepsis Labs: No results for input(s): "PROCALCITON", "LATICACIDVEN" in the last 168 hours.  Recent Results (from the past 240 hour(s))  Urine Culture     Status: Abnormal   Collection Time: 10/02/23  3:22 PM   Specimen: Urine, Clean Catch  Result Value Ref Range Status   Specimen Description   Final    URINE, CLEAN CATCH Performed at [facility], [address] [place], [place] [address]    Special Requests   Final    NONE Reflexed from [id number] Performed at [facility], [address] [place], [place] [address]    Culture (A)  Final    >=100,000 COLONIES/mL KLEBSIELLA PNEUMONIAE >=100,000 COLONIES/mL AEROCOCCUS URINAE    Report Status 10/05/2023 FINAL  Final   Organism ID, Bacteria KLEBSIELLA PNEUMONIAE (A)  Final      Susceptibility   Klebsiella pneumoniae - MIC*    AMPICILLIN >=32 RESISTANT Resistant     CEFAZOLIN <=4 SENSITIVE Sensitive     CEFEPIME <=0.12 SENSITIVE Sensitive     CEFTRIAXONE <=0.25 SENSITIVE Sensitive  CIPROFLOXACIN <=0.25 SENSITIVE Sensitive     GENTAMICIN <=1 SENSITIVE Sensitive     IMIPENEM <=0.25 SENSITIVE Sensitive     NITROFURANTOIN 32 SENSITIVE Sensitive     TRIMETH/SULFA <=20 SENSITIVE Sensitive     AMPICILLIN/SULBACTAM 4 SENSITIVE Sensitive     PIP/TAZO <=4 SENSITIVE Sensitive ug/mL    * >=100,000 COLONIES/mL KLEBSIELLA PNEUMONIAE     Radiology Studies: No results found.   Scheduled Meds:  amLODipine  10 mg Oral Daily   vitamin C  1,000 mg Oral Daily   aspirin EC  81 mg Oral Q M,W,F   enoxaparin (LOVENOX) injection  40 mg Subcutaneous Q24H   hydrALAZINE  25 mg Oral Q8H   multivitamin with minerals  1 tablet Oral Q breakfast   olopatadine  1 drop Both Eyes BID   QUEtiapine  25 mg Oral QHS   simvastatin  20 mg Oral QPM   Continuous Infusions:  cefTRIAXone (ROCEPHIN)  IV  Stopped (10/07/23 1653)     LOS: 5 days    Time spent: 38 min  Alwyn Ren, MD 10/08/2023, 11:47 AM

## 2023-10-08 NOTE — Plan of Care (Signed)

## 2023-10-09 ENCOUNTER — Inpatient Hospital Stay (HOSPITAL_COMMUNITY): Payer: Medicare Other

## 2023-10-09 DIAGNOSIS — N390 Urinary tract infection, site not specified: Secondary | ICD-10-CM | POA: Diagnosis not present

## 2023-10-09 LAB — BASIC METABOLIC PANEL
Anion gap: 8 (ref 5–15)
Anion gap: 12 (ref 5–15)
BUN: 27 mg/dL — ABNORMAL HIGH (ref 8–23)
BUN: 29 mg/dL — ABNORMAL HIGH (ref 8–23)
CO2: 18 mmol/L — ABNORMAL LOW (ref 22–32)
CO2: 17 mmol/L — ABNORMAL LOW (ref 22–32)
Calcium: 9.1 mg/dL (ref 8.9–10.3)
Calcium: 9.9 mg/dL (ref 8.9–10.3)
Chloride: 98 mmol/L (ref 98–111)
Chloride: 98 mmol/L (ref 98–111)
Creatinine, Ser: 1.35 mg/dL — ABNORMAL HIGH (ref 0.44–1.00)
Creatinine, Ser: 1.38 mg/dL — ABNORMAL HIGH (ref 0.44–1.00)
GFR, Estimated: 37 mL/min — ABNORMAL LOW (ref >60)
GFR, Estimated: 36 mL/min — ABNORMAL LOW (ref >60)
Glucose, Bld: 100 mg/dL — ABNORMAL HIGH (ref 70–99)
Glucose, Bld: 124 mg/dL — ABNORMAL HIGH (ref 70–99)
Potassium: 5.9 mmol/L — ABNORMAL HIGH (ref 3.5–5.1)
Potassium: 6.7 mmol/L (ref 3.5–5.1)
Sodium: 124 mmol/L — ABNORMAL LOW (ref 135–145)
Sodium: 127 mmol/L — ABNORMAL LOW (ref 135–145)

## 2023-10-09 LAB — SODIUM, URINE, RANDOM: Sodium, Ur: 44 mmol/L

## 2023-10-09 LAB — OSMOLALITY: Osmolality: 267 mosm/kg — ABNORMAL LOW (ref 275–295)

## 2023-10-09 LAB — OSMOLALITY, URINE: Osmolality, Ur: 261 mosm/kg — ABNORMAL LOW (ref 300–900)

## 2023-10-09 LAB — TSH: TSH: 6.354 u[IU]/mL — ABNORMAL HIGH (ref 0.350–4.500)

## 2023-10-09 MED ORDER — ORAL CARE MOUTH RINSE: 15.0000 mL | OROMUCOSAL | Status: DC | PRN

## 2023-10-09 MED ORDER — FUROSEMIDE 10 MG/ML IJ SOLN
40.0000 mg | Freq: Once | INTRAMUSCULAR | Status: AC
  Administered 2023-10-09: 40 mg via INTRAVENOUS
  Filled 2023-10-09: qty 4

## 2023-10-09 MED ORDER — ORAL CARE MOUTH RINSE
15.0000 mL | OROMUCOSAL | Status: DC
  Administered 2023-10-09 – 2023-10-15 (×24): 15 mL via OROMUCOSAL

## 2023-10-09 MED ORDER — SODIUM ZIRCONIUM CYCLOSILICATE 10 G PO PACK
10.0000 g | PACK | Freq: Three times a day (TID) | ORAL | Status: AC
  Administered 2023-10-09 – 2023-10-10 (×3): 10 g via ORAL
  Filled 2023-10-09 (×3): qty 1

## 2023-10-09 MED ORDER — ENOXAPARIN SODIUM 30 MG/0.3ML IJ SOSY
30.0000 mg | PREFILLED_SYRINGE | INTRAMUSCULAR | Status: DC
  Administered 2023-10-09 – 2023-10-13 (×5): 30 mg via SUBCUTANEOUS
  Filled 2023-10-09 (×5): qty 0.3

## 2023-10-09 NOTE — Consult Note (Signed)
[facility] Nephrology Consultation Note  Requesting MD: Dr. Jerelene Redden Reason for consult: Hyponatremia.  HPI:  Joanne Patterson is a 89 y.o. female with past medical history significant for HTN, DM, HLD, recurrent UTI, hyponatremia, CKD, presented with UTI, seen as a consultation for the evaluation of hyponatremia. The patient was found to have UTI and urine culture growing Klebsiella and Aerococcus sensitive to Rocephin.  Apparently there was some change in mental status. The sodium level was 128 on admission.  She was started on salt tablet but serum sodium level down trended to 124 today therefore the nephrology consult was obtained. Of note, I have seen the patient in 08/2023 for hyponatremia when hydrochlorothiazide was discontinued.  TSH and cortisol level was normal.  There was concern about SIADH therefore received a dose of tolvaptan without much improvement.  Also treated with salt tablet and diuretics.  She responded well with furosemide.  The patient has CKD 3B and follows with Dr. Melanee Spry at [facility] with baseline creatinine level fluctuating anywhere between 1.3-1.7. Today's lab also showed potassium level of 5.9, CO2 18 and a creatinine level of 1.35. Apparently patient was diagnosed with a bladder spasm and was placed on Flomax by urologist.  She is not on Flomax anymore and now dealing with bladder retention. I have discussed with the patient's daughter and nurse who are present at the bedside.  Also discussed with the patient granddaughter who was on the phone as well.   PMHx:  History reviewed. No pertinent past medical history.  History reviewed. No pertinent surgical history.  Family Hx: History reviewed. No pertinent family history.  Social History:  reports that she has never smoked. She has never used smokeless tobacco. No history on file for alcohol use and drug use.  Allergies:  Allergies  Allergen Reactions    Diphenhydramine Other (See Comments)    Hallucinations, sleepiness    Medications: Prior to Admission medications   Medication Sig Start Date End Date Taking? Authorizing Provider  amLODipine (NORVASC) 10 MG tablet Take 10 mg by mouth daily.   Yes [provider]  Ascorbic Acid (VITAMIN C) 1000 MG tablet Take 1,000 mg by mouth daily.   Yes [provider]  aspirin EC 81 MG tablet Take 81 mg by mouth every Monday, Wednesday, and Friday. Swallow whole.   Yes [provider]  furosemide (LASIX) 20 MG tablet Take 0.5 tablets (10 mg total) by mouth daily. 08/28/23  Yes Sheikh, Omair Latif, DO  loratadine (CLARITIN) 10 MG tablet Take 10 mg by mouth daily as needed for allergies or rhinitis.   Yes [provider]  Multiple Vitamins-Minerals (CENTRUM SILVER ULTRA WOMENS) TABS Take 1 tablet by mouth daily with breakfast.   Yes [provider]  olmesartan (BENICAR) 40 MG tablet Take 1 tablet (40 mg total) by mouth daily. 08/27/23 08/26/24 Yes Sheikh, Omair Latif, DO  Omega-3 Fatty Acids (FISH OIL) 1000 MG CAPS Take 1,000 mg by mouth daily.   Yes [provider]  PATADAY 0.2 % SOLN Place 1 drop into both eyes 2 (two) times daily.   Yes [provider]  polyvinyl alcohol (LIQUIFILM TEARS) 1.4 % ophthalmic solution Place 1 drop into both eyes as needed for dry eyes. 08/27/23  Yes Sheikh, Omair Latif, DO  REFRESH TEARS PF 0.5-0.9 % SOLN Place 1 drop into both eyes in the morning.   Yes [provider]  senna (SENOKOT) 8.6 MG TABS tablet Take 2 tablets (17.2 mg  total) by mouth daily. Patient taking differently: Take 2 tablets by mouth daily as needed for mild constipation. 08/28/23  Yes Sheikh, Omair Latif, DO  TYLENOL 500 MG tablet Take 500-1,000 mg by mouth every 8 (eight) hours as needed for mild pain (pain score 1-3) or headache.   Yes [provider]  simvastatin (ZOCOR) 20 MG tablet Take 1 tablet (20 mg total) by mouth every  evening. Patient not taking: Reported on 10/02/2023 09/03/23   Marguerita Merles Latif, DO    I have reviewed the patient's current medications.  Labs: Renal Panel: Recent Labs  Lab 10/04/23 0555 10/05/23 0606 10/06/23 0600 10/07/23 0605 10/08/23 0542 10/09/23 1144  NA 132* 128* 130* 130* 127* 124*  K 4.1 4.3 4.3 4.4 4.7 5.9*  CL 100 98 99 101 98 98  CO2 21* 19* 22 20* 22 18*  GLUCOSE 113* 108* 84 75 84 100*  BUN 16 16 21 20 21  27*  CREATININE 1.23* 1.21* 1.19* 1.26* 1.26* 1.35*  CALCIUM 10.1 9.9 9.6 9.2 9.4 9.1  MG 2.1 2.4 2.4 2.5* 2.3  --      CBC:    Latest Ref Rng & Units 10/08/2023    5:42 AM 10/07/2023    6:05 AM 10/06/2023    6:00 AM  CBC  WBC 4.0 - 10.5 K/uL 8.0  7.1  8.5   Hemoglobin 12.0 - 15.0 g/dL 9.7  8.9  9.5   Hematocrit 36.0 - 46.0 % 29.1  27.5  29.2   Platelets 150 - 400 K/uL 204  179  183      Anemia Panel:  Recent Labs    08/22/23 1506 08/23/23 0453 10/04/23 0555 10/05/23 0606 10/06/23 0600 10/07/23 0605 10/08/23 0542  HGB  --    < > 10.5* 10.6* 9.5* 8.9* 9.7*  MCV  --    < > 89.1 88.9 88.5 89.3 85.8  VITAMINB12 1,001*  --   --   --   --   --   --   FOLATE 30.8  --   --   --   --   --   --   FERRITIN 136  --   --   --   --   --   --   TIBC 252  --   --   --   --   --   --   IRON 19*  --   --   --   --   --   --   RETICCTPCT 1.5  --   --   --   --   --   --    < > = values in this interval not displayed.    No results for input(s): "AST", "ALT", "ALKPHOS", "BILITOT", "PROT", "ALBUMIN" in the last 168 hours.  No results found for: "HGBA1C"  ROS:  Pertinent items noted in HPI and remainder of comprehensive ROS otherwise negative.  Physical Exam: Vitals:   10/09/23 0831 10/09/23 1529  BP: (!) 150/69 (!) 138/50  Pulse: 74 78  Resp: 18 16  Temp: 98.1 F (36.7 C) 97.6 F (36.4 C)  SpO2: 96% 99%     General exam: Appears calm and comfortable  Respiratory system: Clear to auscultation. Respiratory effort normal. No wheezing or  crackle Cardiovascular system: S1 & S2 heard, RRR.  Peripheral edema present.  Gastrointestinal system: Abdomen is nondistended, soft and nontender. Normal bowel sounds heard. Central nervous system: Alert and oriented. No focal neurological deficits. Extremities: Symmetric 5 x  5 power. Skin: No rashes, lesions or ulcers Psychiatry: Judgement and insight appear normal. Mood & affect appropriate.   Assessment/Plan:  # Acute on chronic hyponatremia, hypervolemic on exam: Patient with history of chronic hyponatremia thought to be due to combination of HCTZ and SIADH in the past.  Probably also contributed by urinary retention.  TSH abnormal, may need Synthroid, defer to primary team. Check urine sodium, urine osmolality. Bladder scan and may need urinary catheter. Order a dose of furosemide IV today.  This would help potassium as well. Agree with continuing salt tablet and fluid restriction. Further management depending on the lab results.  # CKD 3B: Serum creatinine level around baseline.  Continue to monitor.  # Hyperkalemia: Agree with Lokelma.  IV Lasix should help.  # Acute urinary retention: Apparently patient was diagnosed with bladder spasm and follows with urologist.  She has appointment upcoming.  Getting kidney ultrasound.  The family requesting urology consult while patient is in the hospital.  May need to back on Flomax, defer to urologist.  # History of hypertension: BP acceptable.  Not on ACE inhibitor or ARB.  I had a long discussion with the patient's daughter who was presented at the bedside and granddaughter over the phone.  Also discussed with the nurse and the primary team.  Thank you for the consult, we will continue to follow.   Syreeta Figler Jaynie Collins 10/09/2023, 4:38 PM  [facility].

## 2023-10-09 NOTE — Progress Notes (Addendum)
PROGRESS NOTE    Joanne Patterson  JYN:[id number] DOB: 1928-07-17 DOA: 10/02/2023 PCP: Jarrett Soho, PA-C   Brief Narrative:  89 yo female with PMH recurrent UTIs, hypertension, DM II, HLD who presented up on recommendation from Center well after outpatient urine culture came back positive. Patient's daughter also provided collateral information stating that the patient was continuing to become more confused and hallucinating prior to admission.  They had obtained urine specimen on 09/26/2023 and had not yet started/been prescribed antibiotics prior to hospitalization. UA on admission showed large LE, negative nitrite, greater than 50 WBC.  She was started on Rocephin and admitted for further workup and monitoring.  Assessment & Plan:   Principal Problem:   Acute cystitis Active Problems:   Hyponatremia   Essential hypertension   Hyperlipidemia   Chronic kidney disease, stage 3b (HCC)   Lower urinary tract infectious disease   UTI urine culture growing Klebsiella and Aerococcus urinae and Corynebacterium stratum Klebsiella sensitive to Rocephin - Main symptom appears to be hallucinations and altered mentation on admission.  Original urine specimen obtained 09/26/2023 outpatient and sent to [facility] for testing; appears patient told culture was positive on 11/25 and to go to ER - obtained fax report and is placed in chart; unfortunately only says "mixed urogenital flora, > 100k colonies). No other micro data sent  no blood cultures were obtained on admission and now with doses given, lower yield to obtain    Hyponatremia-her sodium has been up and down during this hospital stay  Na 124 from 127 from 130 Continue sodium chloride tablets Check urine sodium, urine osmolality, serum osmolality, TSH, cortisol  Hyper kalemia will treat with Lokelma  Chronic kidney disease, stage 3b (HCC) - patient has history of CKD3b. Baseline creat ~ 1.3 - 1.5, eGFR~ 35   Hyperlipidemia -  Continue Zocor   Essential hypertension-hold arb and lasix Start norvasc And hydralazine  Deep tissue pressure injury left heel present on admission  Pressure Injury 10/02/23 Heel Left Deep Tissue Pressure Injury - Purple or maroon localized area of discolored intact skin or blood-filled blister due to damage of underlying soft tissue from pressure and/or shear. (Active)  10/02/23 1809  Location: Heel  Location Orientation: Left  Staging: Deep Tissue Pressure Injury - Purple or maroon localized area of discolored intact skin or blood-filled blister due to damage of underlying soft tissue from pressure and/or shear.  Wound Description (Comments):   Present on Admission: Yes  Dressing Type Foam - Lift dressing to assess site every shift 10/07/23 2138     Estimated body mass index is 30.51 kg/m as calculated from the following:   Height as of this encounter: 5\' 6"  (1.676 m).   Weight as of this encounter: 85.7 kg.  DVT prophylaxis: lovenox Code Status: full Family Communication:dw daughter Disposition Plan:  Status is: Inpatient Remains inpatient appropriate because: aki hyponatremia   Consultants: dr Ronalee Belts  Procedures: none Antimicrobials:rocephin  Subjective:   She is awake  smiling answering questions appropriately Objective: Vitals:   10/09/23 0509 10/09/23 0605 10/09/23 0606 10/09/23 0831  BP: (!) 132/46 (!) 143/55 (!) 143/55 (!) 150/69  Pulse: 72  78 74  Resp: 16  18 18   Temp: 98.3 F (36.8 C)   98.1 F (36.7 C)  TempSrc: Oral   Oral  SpO2: 97%  97% 96%  Weight:      Height:        Intake/Output Summary (Last 24 hours) at 10/09/2023 1252 Last data filed at  10/08/2023 2232 Gross per 24 hour  Intake 480 ml  Output --  Net 480 ml    Filed Weights   10/02/23 1230  Weight: 85.7 kg    Examination:  General exam: Resting in bed Respiratory system: Clear to auscultation. Respiratory effort normal. Cardiovascular system: S1 & S2 heard, RRR. No JVD,  murmurs, rubs, gallops or clicks. No pedal edema. Gastrointestinal system: Abdomen is nondistended, soft and nontender. No organomegaly or masses felt. Normal bowel sounds heard. Central nervous system: Sleeping  Extremities:trace edema   Data Reviewed: I have personally reviewed following labs and imaging studies  CBC: Recent Labs  Lab 10/04/23 0555 10/05/23 0606 10/06/23 0600 10/07/23 0605 10/08/23 0542  WBC 8.6 6.6 8.5 7.1 8.0  NEUTROABS 5.1 3.7 6.0 4.3 5.1  HGB 10.5* 10.6* 9.5* 8.9* 9.7*  HCT 32.8* 32.9* 29.2* 27.5* 29.1*  MCV 89.1 88.9 88.5 89.3 85.8  PLT 220 208 183 179 204   Basic Metabolic Panel: Recent Labs  Lab 10/04/23 0555 10/05/23 0606 10/06/23 0600 10/07/23 0605 10/08/23 0542 10/09/23 1144  NA 132* 128* 130* 130* 127* 124*  K 4.1 4.3 4.3 4.4 4.7 5.9*  CL 100 98 99 101 98 98  CO2 21* 19* 22 20* 22 18*  GLUCOSE 113* 108* 84 75 84 100*  BUN 16 16 21 20 21  27*  CREATININE 1.23* 1.21* 1.19* 1.26* 1.26* 1.35*  CALCIUM 10.1 9.9 9.6 9.2 9.4 9.1  MG 2.1 2.4 2.4 2.5* 2.3  --    GFR: Estimated Creatinine Clearance: 28.7 mL/min (A) (by C-G formula based on SCr of 1.35 mg/dL (H)). Liver Function Tests: No results for input(s): "AST", "ALT", "ALKPHOS", "BILITOT", "PROT", "ALBUMIN" in the last 168 hours. No results for input(s): "LIPASE", "AMYLASE" in the last 168 hours. No results for input(s): "AMMONIA" in the last 168 hours. Coagulation Profile: No results for input(s): "INR", "PROTIME" in the last 168 hours. Cardiac Enzymes: No results for input(s): "CKTOTAL", "CKMB", "CKMBINDEX", "TROPONINI" in the last 168 hours. BNP (last 3 results) No results for input(s): "PROBNP" in the last 8760 hours. HbA1C: No results for input(s): "HGBA1C" in the last 72 hours. CBG: No results for input(s): "GLUCAP" in the last 168 hours. Lipid Profile: No results for input(s): "CHOL", "HDL", "LDLCALC", "TRIG", "CHOLHDL", "LDLDIRECT" in the last 72 hours. Thyroid Function  Tests: No results for input(s): "TSH", "T4TOTAL", "FREET4", "T3FREE", "THYROIDAB" in the last 72 hours. Anemia Panel: No results for input(s): "VITAMINB12", "FOLATE", "FERRITIN", "TIBC", "IRON", "RETICCTPCT" in the last 72 hours. Sepsis Labs: No results for input(s): "PROCALCITON", "LATICACIDVEN" in the last 168 hours.  Recent Results (from the past 240 hour(s))  Urine Culture     Status: Abnormal   Collection Time: 10/02/23  3:22 PM   Specimen: Urine, Clean Catch  Result Value Ref Range Status   Specimen Description   Final    URINE, CLEAN CATCH Performed at [facility], [address] [place], [place] [address]    Special Requests   Final    NONE Reflexed from [id number] Performed at [facility], [address], [place], [place] [address]    Culture (A)  Final    >=100,000 COLONIES/mL KLEBSIELLA PNEUMONIAE >=100,000 COLONIES/mL AEROCOCCUS URINAE    Report Status 10/05/2023 FINAL  Final   Organism ID, Bacteria KLEBSIELLA PNEUMONIAE (A)  Final      Susceptibility   Klebsiella pneumoniae - MIC*    AMPICILLIN >=32 RESISTANT Resistant     CEFAZOLIN <=4 SENSITIVE Sensitive  CEFEPIME <=0.12 SENSITIVE Sensitive     CEFTRIAXONE <=0.25 SENSITIVE Sensitive     CIPROFLOXACIN <=0.25 SENSITIVE Sensitive     GENTAMICIN <=1 SENSITIVE Sensitive     IMIPENEM <=0.25 SENSITIVE Sensitive     NITROFURANTOIN 32 SENSITIVE Sensitive     TRIMETH/SULFA <=20 SENSITIVE Sensitive     AMPICILLIN/SULBACTAM 4 SENSITIVE Sensitive     PIP/TAZO <=4 SENSITIVE Sensitive ug/mL    * >=100,000 COLONIES/mL KLEBSIELLA PNEUMONIAE     Radiology Studies: No results found.   Scheduled Meds:  amLODipine  10 mg Oral Daily   vitamin C  1,000 mg Oral Daily   aspirin EC  81 mg Oral Q M,W,F   enoxaparin (LOVENOX) injection  40 mg Subcutaneous Q24H   hydrALAZINE  25 mg Oral Q8H   multivitamin with minerals  1 tablet Oral Q breakfast   olopatadine  1 drop Both Eyes BID   mouth rinse  15  mL Mouth Rinse 4 times per day   QUEtiapine  25 mg Oral QHS   simvastatin  20 mg Oral QPM   sodium chloride  1 g Oral TID WC   sodium zirconium cyclosilicate  10 g Oral TID   Continuous Infusions:  cefTRIAXone (ROCEPHIN)  IV 1 g (10/08/23 1700)     LOS: 6 days    Time spent: 38 min  Alwyn Ren, MD 10/09/2023, 12:52 PM

## 2023-10-09 NOTE — Plan of Care (Signed)
Patient has experienced urinary retention today. Her bladder was scanned and had . Staff assisted patient to sit on side of bed and then to standing position, with goal of using BSC. She was unable to safely pivot and experienced incontinence while standing. Dr. Ashley Royalty was notified. Urine tests pending collection; to attempt I&O when bladder refills.  Problem: Clinical Measurements: Goal: Ability to maintain clinical measurements within normal limits will improve Outcome: Progressing   Problem: Elimination: Goal: Will not experience complications related to urinary retention Outcome: Not Progressing   Problem: Pain Management: Goal: General experience of comfort will improve Outcome: Progressing   Problem: Safety: Goal: Ability to remain free from injury will improve Outcome: Progressing   Haydee Salter, RN 10/09/23 4:47 PM

## 2023-10-09 NOTE — Progress Notes (Signed)
Renal ultrasound was recently completed and tech advises patient bladder full. Dr. Ronalee Belts has requested that RN utilize pure wick to collect urine. Pure wick in place and awaiting output. Haydee Salter, RN 10/09/23 5:50 PM

## 2023-10-09 NOTE — Progress Notes (Signed)
Date and time results received: 10/09/23 2009   Test: Potassium Critical Value: 6.7  Name of Provider Notified: Chinita Greenland NP  Orders Received? Or Actions Taken?:

## 2023-10-09 NOTE — Progress Notes (Signed)
PT Cancellation Note  Patient Details Name: REDENA BUGAJ MRN: [id number] DOB: Mar 01, 1928   Cancelled Treatment:    Reason Eval/Treat Not Completed:  Chart reviewed. Bil hip xrays ordered today. Will hold PT for imaging results.    Faye Ramsay, PT Acute Rehabilitation  Office: [phone]

## 2023-10-10 DIAGNOSIS — N390 Urinary tract infection, site not specified: Secondary | ICD-10-CM | POA: Diagnosis not present

## 2023-10-10 LAB — BASIC METABOLIC PANEL
Anion gap: 8 (ref 5–15)
BUN: 29 mg/dL — ABNORMAL HIGH (ref 8–23)
CO2: 22 mmol/L (ref 22–32)
Calcium: 9.4 mg/dL (ref 8.9–10.3)
Chloride: 93 mmol/L — ABNORMAL LOW (ref 98–111)
Creatinine, Ser: 1.39 mg/dL — ABNORMAL HIGH (ref 0.44–1.00)
GFR, Estimated: 35 mL/min — ABNORMAL LOW (ref >60)
Glucose, Bld: 126 mg/dL — ABNORMAL HIGH (ref 70–99)
Potassium: 4.4 mmol/L (ref 3.5–5.1)
Sodium: 123 mmol/L — ABNORMAL LOW (ref 135–145)

## 2023-10-10 LAB — COMPREHENSIVE METABOLIC PANEL
ALT: 397 U/L — ABNORMAL HIGH (ref 0–44)
AST: 415 U/L — ABNORMAL HIGH (ref 15–41)
Albumin: 2.9 g/dL — ABNORMAL LOW (ref 3.5–5.0)
Alkaline Phosphatase: 254 U/L — ABNORMAL HIGH (ref 38–126)
Anion gap: 10 (ref 5–15)
BUN: 30 mg/dL — ABNORMAL HIGH (ref 8–23)
CO2: 20 mmol/L — ABNORMAL LOW (ref 22–32)
Calcium: 9.7 mg/dL (ref 8.9–10.3)
Chloride: 97 mmol/L — ABNORMAL LOW (ref 98–111)
Creatinine, Ser: 1.4 mg/dL — ABNORMAL HIGH (ref 0.44–1.00)
GFR, Estimated: 35 mL/min — ABNORMAL LOW (ref >60)
Glucose, Bld: 93 mg/dL (ref 70–99)
Potassium: 4.5 mmol/L (ref 3.5–5.1)
Sodium: 127 mmol/L — ABNORMAL LOW (ref 135–145)
Total Bilirubin: 0.4 mg/dL (ref <1.2)
Total Protein: 6.5 g/dL (ref 6.5–8.1)

## 2023-10-10 LAB — CORTISOL: Cortisol, Plasma: 19.6 ug/dL

## 2023-10-10 LAB — T4, FREE: Free T4: 1.15 ng/dL — ABNORMAL HIGH (ref 0.61–1.12)

## 2023-10-10 MED ORDER — TAMSULOSIN HCL 0.4 MG PO CAPS
0.4000 mg | ORAL_CAPSULE | Freq: Every day | ORAL | Status: DC
  Administered 2023-10-10 – 2023-10-15 (×6): 0.4 mg via ORAL
  Filled 2023-10-10 (×6): qty 1

## 2023-10-10 MED ORDER — FUROSEMIDE 10 MG/ML IJ SOLN
40.0000 mg | Freq: Once | INTRAMUSCULAR | Status: AC
  Administered 2023-10-10: 40 mg via INTRAVENOUS
  Filled 2023-10-10: qty 4

## 2023-10-10 NOTE — Plan of Care (Signed)

## 2023-10-10 NOTE — Plan of Care (Signed)
  Problem: Health Behavior/Discharge Planning: Goal: Ability to manage health-related needs will improve Outcome: Progressing   Problem: Clinical Measurements: Goal: Respiratory complications will improve Outcome: Progressing Goal: Cardiovascular complication will be avoided Outcome: Progressing   Problem: Safety: Goal: Ability to remain free from injury will improve Outcome: Progressing   Problem: Skin Integrity: Goal: Risk for impaired skin integrity will decrease Outcome: Progressing

## 2023-10-10 NOTE — Consult Note (Signed)
Urology Consult Note   Requesting Attending Physician:  Alwyn Ren, MD Service Providing Consult: Urology  Consulting Attending: Dr. Alvester Morin   Reason for Consult:  urinary retention  HPI: Joanne Patterson is seen in consultation for reasons noted above at the request of Alwyn Ren, MD. 89 year old female presenting to [facility] emergency department for positive urine culture, weakness, and ultimately admitted for hyponatremia.  [facility] was contacted to assess whether patient needed to restart Flomax and assess for urinary retention.  Patient is known to our practice and recently saw Dr. Sherron Monday at the beginning of October who placed her on suppressive trimethoprim, with plans for pelvic examination and diagnostic cystoscopy on her next visit.  This was the only medication provided.  I discussed her history and present state with her daughter and granddaughter by phone.  All questions were answered to their satisfaction.  The patient was alert but spoke very little and was resting comfortably in the chair on rounds.  ------------------  Assessment:  89 y.o. female with suspected urinary retention   Recommendations:  #Mixed incontinence We did not start the Flomax but patient's family stated that she did appreciate some benefit from it so it is reasonable to trial again while she is in hospital. Renal ultrasound collected 12/2 shows no hydronephrosis bilaterally with normal bladder distention. Patient presently voiding with pure wick. Nursing notes postvoid residual of greater than 500.  Considering her volume overload this may be a spurious reading.   Serum creatinine keeping with her baseline. Recommend trending daily labs and will consider placing Foley catheter if serum creatinine begins to go up. Keep outpatient appt with Urology in two days, if medically appropriate for discharge  Case and plan discussed with Dr. Alvester Morin  Past Medical History: History  reviewed. No pertinent past medical history.  Past Surgical History:  History reviewed. No pertinent surgical history.  Medication: Current Facility-Administered Medications  Medication Dose Route Frequency Provider Last Rate Last Admin   acetaminophen (TYLENOL) tablet 650 mg  650 mg Oral Q6H PRN Kirby Crigler, Mir M, MD   650 mg at 10/09/23 1417   Or   acetaminophen (TYLENOL) suppository 650 mg  650 mg Rectal Q6H PRN Kirby Crigler, Mir M, MD       albuterol (PROVENTIL) (2.5 MG/3ML) 0.083% nebulizer solution 2.5 mg  2.5 mg Nebulization Q2H PRN Kirby Crigler, Mir M, MD       amLODipine (NORVASC) tablet 10 mg  10 mg Oral Daily Alwyn Ren, MD   10 mg at 10/10/23 7829   ascorbic acid (VITAMIN C) tablet 1,000 mg  1,000 mg Oral Daily Kirby Crigler, Mir M, MD   1,000 mg at 10/10/23 5621   aspirin EC tablet 81 mg  81 mg Oral Q M,W,F Kirby Crigler, Mir M, MD   81 mg at 10/09/23 1011   cefTRIAXone (ROCEPHIN) 1 g in sodium chloride 0.9 % 100 mL IVPB  1 g Intravenous Q24H Lorre Nick, MD 200 mL/hr at 10/09/23 1801 1 g at 10/09/23 1801   enoxaparin (LOVENOX) injection 30 mg  30 mg Subcutaneous Q24H Lynden Ang, RPH   30 mg at 10/09/23 2223   hydrALAZINE (APRESOLINE) tablet 25 mg  25 mg Oral Q8H Alwyn Ren, MD   25 mg at 10/10/23 0511   loratadine (CLARITIN) tablet 10 mg  10 mg Oral Daily PRN Maryln Gottron, MD       multivitamin with minerals tablet 1 tablet  1 tablet Oral Q breakfast Kirby Crigler, Mir M,  MD   1 tablet at 10/10/23 0918   olopatadine (PATANOL) 0.1 % ophthalmic solution 1 drop  1 drop Both Eyes BID Lewie Chamber, MD   1 drop at 10/10/23 0918   ondansetron (ZOFRAN) tablet 4 mg  4 mg Oral Q6H PRN Kirby Crigler, Mir M, MD       Or   ondansetron [facility]) injection 4 mg  4 mg Intravenous Q6H PRN Maryln Gottron, MD       Oral care mouth rinse  15 mL Mouth Rinse 4 times per day Alwyn Ren, MD   15 mL at 10/10/23 1156   Oral care mouth rinse  15 mL Mouth Rinse PRN Alwyn Ren, MD       polyvinyl alcohol (LIQUIFILM TEARS) 1.4 % ophthalmic solution 1 drop  1 drop Both Eyes PRN Lewie Chamber, MD       QUEtiapine (SEROQUEL) tablet 25 mg  25 mg Oral QHS Alwyn Ren, MD   25 mg at 10/09/23 2223   simvastatin (ZOCOR) tablet 20 mg  20 mg Oral QPM Kirby Crigler, Mir M, MD   20 mg at 10/09/23 1805   sodium chloride tablet 1 g  1 g Oral TID WC Alwyn Ren, MD   1 g at 10/10/23 1156   tamsulosin (FLOMAX) capsule 0.4 mg  0.4 mg Oral Daily Wyline Mood, NP       traZODone (DESYREL) tablet 25 mg  25 mg Oral QHS PRN Maryln Gottron, MD   25 mg at 10/05/23 2038    Allergies: Allergies  Allergen Reactions   Diphenhydramine Other (See Comments)    Hallucinations, sleepiness    Social History: Social History   Tobacco Use   Smoking status: Never   Smokeless tobacco: Never  Vaping Use   Vaping status: Never Used    Family History History reviewed. No pertinent family history.  Review of Systems  Genitourinary:  Positive for frequency and urgency.     Objective   Vital signs in last 24 hours: BP (!) 127/55 (BP Location: Right Arm)   Pulse 70   Temp 97.7 F (36.5 C) (Oral)   Resp 16   Ht 5\' 6"  (1.676 m)   Wt 85.7 kg   SpO2 100%   BMI 30.51 kg/m   Physical Exam General: NAD, A&O, resting, appropriate HEENT: [place]/AT Pulmonary: Normal work of breathing Cardiovascular:  no cyanosis    Most Recent Labs: Lab Results  Component Value Date   WBC 8.0 10/08/2023   HGB 9.7 (L) 10/08/2023   HCT 29.1 (L) 10/08/2023   PLT 204 10/08/2023    Lab Results  Component Value Date   NA 127 (L) 10/10/2023   K 4.5 10/10/2023   CL 97 (L) 10/10/2023   CO2 20 (L) 10/10/2023   BUN 30 (H) 10/10/2023   CREATININE 1.40 (H) 10/10/2023   CALCIUM 9.7 10/10/2023   MG 2.3 10/08/2023   PHOS 3.2 08/27/2023    No results found for: "INR", "APTT"   Urine Culture: @[id number] (laburin,org,[id number],[id number])@   IMAGING: US RENAL  Result  Date: 10/09/2023 CLINICAL DATA:  Acute kidney injury EXAM: RENAL / URINARY TRACT ULTRASOUND COMPLETE COMPARISON:  03/03/2023 FINDINGS: Right Kidney: Renal measurements: 8.7 x 5 x 6.4 cm = volume: 146 mL. Diffuse parenchymal thinning with increased parenchymal echotexture suggesting chronic medical renal disease. Small circumscribed cysts are demonstrated, largest measuring 3.1 cm maximal diameter. No change since prior study. No imaging follow-up is indicated. No hydronephrosis or solid mass.  Left Kidney: Renal measurements: 9.5 x 5.7 x 6 cm = volume: 170 mL. Diffuse parenchymal thinning with increased parenchymal echotexture suggesting chronic medical renal disease. Small circumscribed cysts are demonstrated, largest measuring 3.4 cm maximal diameter. No change since prior study. No imaging follow-up is indicated. No hydronephrosis or solid mass. Bladder: Appears normal for degree of bladder distention. Other: None. IMPRESSION: Bilateral renal parenchymal thinning and increased echotexture suggesting chronic medical renal disease. No hydronephrosis. Electronically Signed   By: Burman Nieves M.D.   On: 10/09/2023 22:11   DG HIPS BILAT WITH PELVIS 2V  Result Date: 10/09/2023 CLINICAL DATA:  Hip pain EXAM: DG HIP (WITH OR WITHOUT PELVIS) 2V BILAT COMPARISON:  None Available. FINDINGS: SI joint degenerative change. Pubic symphysis appears intact. No acute fracture or malalignment. Probable stool within the rectum overlying the pubic symphysis. Mild bilateral hip degenerative changes. Vascular calcifications. IMPRESSION: Mild bilateral hip degenerative changes. Electronically Signed   By: Jasmine Pang M.D.   On: 10/09/2023 15:06    ------  Elmon Kirschner, NP Pager: [phone]   Please contact the urology consult pager with any further questions/concerns.

## 2023-10-10 NOTE — Progress Notes (Signed)
Physical Therapy Treatment Patient Details Name: Joanne Patterson MRN: [id number] DOB: 1927-11-21 Today's Date: 10/10/2023   History of Present Illness Pt is a 89 y.o. female admitted with UTI. PMH significant for HTN, type II DM, hyperlipidemia, recurrent UTI and hyponatremia, L heel wound, R ankle sprain 08/2023, falls. Pt indicating B LE pain and imaging of pelvis and B hips negative for acute findings 10/10/2023.     PT Comments  Pt admitted with above diagnosis.  Pt currently with functional limitations due to the deficits listed below (see PT Problem List). Pt appears to have regressed at this time with therapy intervention due to B LE and abdominal pain as well as fear of falling. Pt required increased time for all functional mobility tasks with multimodal cues and encouragement. Pt required mod A for bed mobility. Mod A for sit to stand  from elevated EOB, mod A for initial standing balance and min A x 2 for safety with gait tasks at [facility] for 5 feet with specific cues for LE advancement, encouragement and assist with RW management with recliner close. Pt left seated in recliner and all needs in place, daughter present and NP from urology in room. Patient will benefit from continued inpatient follow up therapy, <3 hours/day.  Pt will benefit from acute skilled PT to increase their independence and safety with mobility to allow discharge.      If plan is discharge home, recommend the following: Assistance with cooking/housework;Assist for transportation;Help with stairs or ramp for entrance;A lot of help with walking and/or transfers;A lot of help with bathing/dressing/bathroom   Can travel by private vehicle     No  Equipment Recommendations  Rolling walker (2 wheels)    Recommendations for Other Services       Precautions / Restrictions Precautions Precautions: Fall Precaution Comments: poor cognition Restrictions Weight Bearing Restrictions: No     Mobility  Bed Mobility Overal  bed mobility: Needs Assistance Bed Mobility: Sit to Supine     Supine to sit: Min assist, HOB elevated, Used rails     General bed mobility comments: increased time, cues, mod A for trunk and LEs to EOB, pt able to scoot in sitting to EOB with min A and cues and progressed to CGA    Transfers Overall transfer level: Needs assistance Equipment used: Rolling walker (2 wheels) Transfers: Sit to/from Stand Sit to Stand: Mod assist, From elevated surface           General transfer comment: multimodal cues increased time and pull to stand at RW, mod A for initial standing balance pt reported fear of falling and required constant cues for posture    Ambulation/Gait Ambulation/Gait assistance: Min assist, +2 safety/equipment Gait Distance (Feet): 5 Feet Assistive device: Rolling walker (2 wheels) Gait Pattern/deviations: Step-to pattern, Antalgic, Trunk flexed Gait velocity: decreased     General Gait Details: max cues for encouragement , step to pattern with cues for LE advacement and A for RW and balance, daughter provided some assist with PT and recliner close behind   Stairs             Wheelchair Mobility     Tilt Bed    Modified Rankin (Stroke Patients Only)       Balance Overall balance assessment: Needs assistance, History of Falls Sitting-balance support: Feet supported Sitting balance-Leahy Scale: Fair     Standing balance support: Reliant on assistive device for balance, During functional activity, Bilateral upper extremity supported Standing balance-Leahy Scale: Zero  Cognition Arousal: Alert Behavior During Therapy: Flat affect Overall Cognitive Status: Impaired/Different from baseline Area of Impairment: Following commands, Problem solving, Safety/judgement                 Orientation Level: Place, Time, Situation, Disoriented to   Memory: Decreased short-term memory Following Commands: Follows  one step commands with increased time Safety/Judgement: Decreased awareness of safety, Decreased awareness of deficits   Problem Solving: Difficulty sequencing, Decreased initiation, Requires verbal cues, Requires tactile cues, Slow processing General Comments: pt appears very fearful of falling, required repeated cues with increased time; daughter able to provide encouragement and assist with cues for participation        Exercises      General Comments        Pertinent Vitals/Pain Pain Assessment Pain Assessment: Faces Faces Pain Scale: Hurts whole lot Pain Location: B LE R > L and lower abdomen Pain Descriptors / Indicators: Constant, Cramping, Discomfort, Dull, Grimacing, Crying, Throbbing Pain Intervention(s): Monitored during session, Limited activity within patient's tolerance, Repositioned    Home Living                          Prior Function            PT Goals (current goals can now be found in the care plan section) Acute Rehab PT Goals Patient Stated Goal: per daughter, for pt to get better, stronger and return home PT Goal Formulation: With family Time For Goal Achievement: 10/17/23 Potential to Achieve Goals: Fair Progress towards PT goals: Not progressing toward goals - comment    Frequency    Min 1X/week      PT Plan      Co-evaluation              AM-PAC PT "6 Clicks" Mobility   Outcome Measure  Help needed turning from your back to your side while in a flat bed without using bedrails?: A Lot Help needed moving from lying on your back to sitting on the side of a flat bed without using bedrails?: A Lot Help needed moving to and from a bed to a chair (including a wheelchair)?: A Lot Help needed standing up from a chair using your arms (e.g., wheelchair or bedside chair)?: A Lot Help needed to walk in hospital room?: A Lot Help needed climbing 3-5 steps with a railing? : Total 6 Click Score: 11    End of Session Equipment  Utilized During Treatment: Gait belt Activity Tolerance: Patient limited by fatigue;Patient limited by pain Patient left: with call bell/phone within reach;with family/visitor present;in chair;with chair alarm set Nurse Communication: Mobility status PT Visit Diagnosis: Muscle weakness (generalized) (M62.81);Unsteadiness on feet (R26.81);History of falling (Z91.81);Pain;Difficulty in walking, not elsewhere classified (R26.2)     Time: 1610-9604 PT Time Calculation (min) (ACUTE ONLY): 24 min  Charges:    $Gait Training: 8-22 mins $Therapeutic Activity: 8-22 mins PT General Charges $$ ACUTE PT VISIT: 1 Visit                     Johnny Bridge, PT Acute Rehab    Joanne Patterson 10/10/2023, 4:12 PM

## 2023-10-10 NOTE — Progress Notes (Signed)
[name]  Assessment/ Plan: Pt is a 89 y.o. yo female   with past medical history significant for HTN, DM, HLD, recurrent UTI, hyponatremia, CKD, presented with UTI, seen as a consultation for the evaluation of hyponatremia.   # Acute on chronic hyponatremia, hypervolemic on exam: Patient with history of chronic hyponatremia thought to be due to combination of HCTZ and SIADH in the past.  Probably also contributed by urinary retention.  TSH abnormal, may need Synthroid, defer to primary team.  Urine lites and osmolality with mixed picture. Treated with a dose of IV Lasix yesterday with improvement of sodium level to 127.  I will order another dose of IV Lasix today and possibly changing to p.o. tomorrow pending the lab results.  Also on salt tablets which can be discontinued soon.  She will likely need higher dose of Lasix on discharge. Follow labs.  # CKD 3B: Serum creatinine level around baseline.  Continue to monitor.   # Hyperkalemia: Improved with Lokelma and Lasix.  Recommend low potassium diet for now.   # Acute urinary retention: Apparently patient was diagnosed with bladder spasm and follows with urologist.  She has appointment upcoming.  Kidney ultrasound with bilateral cortical thinning but no hydronephrosis. The family requesting urology consult while patient is in the hospital.  May need to back on Flomax, defer to urologist.   # History of hypertension: BP acceptable.  Not on ACE inhibitor or ARB.  Subjective: Seen and examined at bedside.  Patient denies nausea, vomiting, chest pain, shortness of breath.  Urine output is not accurately measured only documented 50 cc.  Received a dose of Lasix yesterday.  Patient's daughter was presented at the bedside with her. Objective Vital signs in last 24 hours: Vitals:   10/09/23 2134 10/09/23 2223 10/09/23 2227 10/10/23 0509  BP: (!) 129/51 (!) 129/51 (!) 143/68 (!) 127/55  Pulse: 71  70 70   Resp: 18   16  Temp: 97.6 F (36.4 C)   97.7 F (36.5 C)  TempSrc: Oral   Oral  SpO2: 100%   100%  Weight:      Height:       Weight change:   Intake/Output Summary (Last 24 hours) at 10/10/2023 1020 Last data filed at 10/10/2023 0945 Gross per 24 hour  Intake 820 ml  Output 50 ml  Net 770 ml       Labs: RENAL PANEL Recent Labs  Lab 10/04/23 0555 10/05/23 0606 10/06/23 0600 10/07/23 0605 10/08/23 0542 10/09/23 1144 10/09/23 1939 10/09/23 2336 10/10/23 0609  NA 132* 128* 130* 130* 127* 124* 127* 123* 127*  K 4.1 4.3 4.3 4.4 4.7 5.9* 6.7* 4.4 4.5  CL 100 98 99 101 98 98 98 93* 97*  CO2 21* 19* 22 20* 22 18* 17* 22 20*  GLUCOSE 113* 108* 84 75 84 100* 124* 126* 93  BUN 16 16 21 20 21  27* 29* 29* 30*  CREATININE 1.23* 1.21* 1.19* 1.26* 1.26* 1.35* 1.38* 1.39* 1.40*  CALCIUM 10.1 9.9 9.6 9.2 9.4 9.1 9.9 9.4 9.7  MG 2.1 2.4 2.4 2.5* 2.3  --   --   --   --   ALBUMIN  --   --   --   --   --   --   --   --  2.9*    Liver Function Tests: Recent Labs  Lab 10/10/23 0609  AST 415*  ALT 397*  ALKPHOS 254*  BILITOT 0.4  PROT 6.5  ALBUMIN 2.9*   No results for input(s): "LIPASE", "AMYLASE" in the last 168 hours. No results for input(s): "AMMONIA" in the last 168 hours. CBC: Recent Labs    08/22/23 1506 08/23/23 0453 10/04/23 0555 10/05/23 0606 10/06/23 0600 10/07/23 0605 10/08/23 0542  HGB  --    < > 10.5* 10.6* 9.5* 8.9* 9.7*  MCV  --    < > 89.1 88.9 88.5 89.3 85.8  VITAMINB12 1,001*  --   --   --   --   --   --   FOLATE 30.8  --   --   --   --   --   --   FERRITIN 136  --   --   --   --   --   --   TIBC 252  --   --   --   --   --   --   IRON 19*  --   --   --   --   --   --   RETICCTPCT 1.5  --   --   --   --   --   --    < > = values in this interval not displayed.    Cardiac Enzymes: No results for input(s): "CKTOTAL", "CKMB", "CKMBINDEX", "TROPONINI" in the last 168 hours. CBG: No results for input(s): "GLUCAP" in the last 168 hours.  Iron  Studies: No results for input(s): "IRON", "TIBC", "TRANSFERRIN", "FERRITIN" in the last 72 hours. Studies/Results: US RENAL  Result Date: 10/09/2023 CLINICAL DATA:  Acute kidney injury EXAM: RENAL / URINARY TRACT ULTRASOUND COMPLETE COMPARISON:  03/03/2023 FINDINGS: Right Kidney: Renal measurements: 8.7 x 5 x 6.4 cm = volume: 146 mL. Diffuse parenchymal thinning with increased parenchymal echotexture suggesting chronic medical renal disease. Small circumscribed cysts are demonstrated, largest measuring 3.1 cm maximal diameter. No change since prior study. No imaging follow-up is indicated. No hydronephrosis or solid mass. Left Kidney: Renal measurements: 9.5 x 5.7 x 6 cm = volume: 170 mL. Diffuse parenchymal thinning with increased parenchymal echotexture suggesting chronic medical renal disease. Small circumscribed cysts are demonstrated, largest measuring 3.4 cm maximal diameter. No change since prior study. No imaging follow-up is indicated. No hydronephrosis or solid mass. Bladder: Appears normal for degree of bladder distention. Other: None. IMPRESSION: Bilateral renal parenchymal thinning and increased echotexture suggesting chronic medical renal disease. No hydronephrosis. Electronically Signed   By: Burman Nieves M.D.   On: 10/09/2023 22:11   DG HIPS BILAT WITH PELVIS 2V  Result Date: 10/09/2023 CLINICAL DATA:  Hip pain EXAM: DG HIP (WITH OR WITHOUT PELVIS) 2V BILAT COMPARISON:  None Available. FINDINGS: SI joint degenerative change. Pubic symphysis appears intact. No acute fracture or malalignment. Probable stool within the rectum overlying the pubic symphysis. Mild bilateral hip degenerative changes. Vascular calcifications. IMPRESSION: Mild bilateral hip degenerative changes. Electronically Signed   By: Jasmine Pang M.D.   On: 10/09/2023 15:06    Medications: Infusions:  cefTRIAXone (ROCEPHIN)  IV 1 g (10/09/23 1801)    Scheduled Medications:  amLODipine  10 mg Oral Daily   vitamin C   1,000 mg Oral Daily   aspirin EC  81 mg Oral Q M,W,F   enoxaparin (LOVENOX) injection  30 mg Subcutaneous Q24H   hydrALAZINE  25 mg Oral Q8H   multivitamin with minerals  1 tablet Oral Q breakfast   olopatadine  1 drop Both Eyes BID   mouth rinse  15 mL Mouth Rinse 4  times per day   QUEtiapine  25 mg Oral QHS   simvastatin  20 mg Oral QPM   sodium chloride  1 g Oral TID WC    have reviewed scheduled and prn medications.  Physical Exam: General:NAD, comfortable Heart:RRR, s1s2 nl Lungs:clear b/l, no crackle Abdomen:soft, Non-tender, non-distended Extremities: Bilateral upper and lower extremity edema present. Neurology: Alert, awake and following commands  Joanne Patterson Jaynie Collins 10/10/2023,10:20 AM  LOS: 7 days

## 2023-10-10 NOTE — Progress Notes (Signed)
PROGRESS NOTE    Joanne Patterson  ZOX:[id number] DOB: 09-21-1928 DOA: 10/02/2023 PCP: Jarrett Soho, PA-C   Brief Narrative:  89 yo female with PMH recurrent UTIs, hypertension, DM II, HLD who presented up on recommendation from Center well after outpatient urine culture came back positive. Patient's daughter also provided collateral information stating that the patient was continuing to become more confused and hallucinating prior to admission.  They had obtained urine specimen on 09/26/2023 and had not yet started/been prescribed antibiotics prior to hospitalization. UA on admission showed large LE, negative nitrite, greater than 50 WBC.  She was started on Rocephin and admitted for further workup and monitoring.  Assessment & Plan:   Principal Problem:   Acute cystitis Active Problems:   Hyponatremia   Essential hypertension   Hyperlipidemia   Chronic kidney disease, stage 3b (HCC)   Lower urinary tract infectious disease  Complicated UTI - urine culture growing Klebsiella and Aerococcus urinae and Corynebacterium stratum Klebsiella sensitive to Rocephin-patient presented with hallucination and change in mental status on admission. Last dose rocephin 12/4   Hypervolemic Hyponatremia-her sodium has been up and down during this hospital stay  Na 124 from 127 from 130 She has acute on chronic hyponatremia related to HCTZ and SIADH in the past per nephrology Continue sodium chloride tablets She was started on Lasix 12/2 sodium improved to 127 after Lasix Plasma cortisol 19.6 within normal limits TSH 6.35 Nephrology consulted appreciate input urine sodium, urine osmolality, serum osmolality noted  Hyperkalemia treated with Lokelma and lasix  Chronic kidney disease, stage 3b (HCC) - patient has history of CKD3b. Baseline creat ~ 1.3 - 1.5, eGFR~ 35   Hyperlipidemia - Continue Zocor   Essential hypertension-hold arb and lasix Start norvasc And hydralazine  Deep tissue  pressure injury left heel present on admission  Acute urinary retention-appreciate urology input Will try her on Flomax while she is in the hospital Currently has pure wick in place If cr trends up consider putting Foley catheter and following up with urology as an outpatient.  Abnormal TSH check T 3 and T 4 (Likely euthyroid state no change in treatment)  Elevated LFT S Hold statin  Recheck in am   Pressure Injury 10/02/23 Heel Left Deep Tissue Pressure Injury - Purple or maroon localized area of discolored intact skin or blood-filled blister due to damage of underlying soft tissue from pressure and/or shear. (Active)  10/02/23 1809  Location: Heel  Location Orientation: Left  Staging: Deep Tissue Pressure Injury - Purple or maroon localized area of discolored intact skin or blood-filled blister due to damage of underlying soft tissue from pressure and/or shear.  Wound Description (Comments):   Present on Admission: Yes  Dressing Type None 10/09/23 1955     Estimated body mass index is 30.51 kg/m as calculated from the following:   Height as of this encounter: 5\' 6"  (1.676 m).   Weight as of this encounter: 85.7 kg.  DVT prophylaxis: lovenox Code Status: full Family Communication:dw daughter Disposition Plan:  Status is: Inpatient Remains inpatient appropriate because: aki hyponatremia   Consultants: dr Ronalee Belts  Procedures: none Antimicrobials:rocephin  Subjective:  Sleep was interupted  No sob Purewick in  Lasix given yest Daughter at bed side  Objective: Vitals:   10/09/23 2134 10/09/23 2223 10/09/23 2227 10/10/23 0509  BP: (!) 129/51 (!) 129/51 (!) 143/68 (!) 127/55  Pulse: 71  70 70  Resp: 18   16  Temp: 97.6 F (36.4 C)   97.7 F (  36.5 C)  TempSrc: Oral   Oral  SpO2: 100%   100%  Weight:      Height:        Intake/Output Summary (Last 24 hours) at 10/10/2023 1206 Last data filed at 10/10/2023 0945 Gross per 24 hour  Intake 820 ml  Output 250 ml   Net 570 ml    Filed Weights   10/02/23 1230  Weight: 85.7 kg    Examination:  General exam: Resting in bed Respiratory system: Clear to auscultation. Respiratory effort normal. Cardiovascular system: S1 & S2 heard, RRR. No JVD, murmurs, rubs, gallops or clicks. No pedal edema. Gastrointestinal system: Abdomen is nondistended, soft and nontender. No organomegaly or masses felt. Normal bowel sounds heard. Central nervous system: awake alert   Extremities:trace edema   Data Reviewed: I have personally reviewed following labs and imaging studies  CBC: Recent Labs  Lab 10/04/23 0555 10/05/23 0606 10/06/23 0600 10/07/23 0605 10/08/23 0542  WBC 8.6 6.6 8.5 7.1 8.0  NEUTROABS 5.1 3.7 6.0 4.3 5.1  HGB 10.5* 10.6* 9.5* 8.9* 9.7*  HCT 32.8* 32.9* 29.2* 27.5* 29.1*  MCV 89.1 88.9 88.5 89.3 85.8  PLT 220 208 183 179 204   Basic Metabolic Panel: Recent Labs  Lab 10/04/23 0555 10/05/23 0606 10/06/23 0600 10/07/23 0605 10/08/23 0542 10/09/23 1144 10/09/23 1939 10/09/23 2336 10/10/23 0609  NA 132* 128* 130* 130* 127* 124* 127* 123* 127*  K 4.1 4.3 4.3 4.4 4.7 5.9* 6.7* 4.4 4.5  CL 100 98 99 101 98 98 98 93* 97*  CO2 21* 19* 22 20* 22 18* 17* 22 20*  GLUCOSE 113* 108* 84 75 84 100* 124* 126* 93  BUN 16 16 21 20 21  27* 29* 29* 30*  CREATININE 1.23* 1.21* 1.19* 1.26* 1.26* 1.35* 1.38* 1.39* 1.40*  CALCIUM 10.1 9.9 9.6 9.2 9.4 9.1 9.9 9.4 9.7  MG 2.1 2.4 2.4 2.5* 2.3  --   --   --   --    GFR: Estimated Creatinine Clearance: 27.7 mL/min (A) (by C-G formula based on SCr of 1.4 mg/dL (H)). Liver Function Tests: Recent Labs  Lab 10/10/23 0609  AST 415*  ALT 397*  ALKPHOS 254*  BILITOT 0.4  PROT 6.5  ALBUMIN 2.9*   No results for input(s): "LIPASE", "AMYLASE" in the last 168 hours. No results for input(s): "AMMONIA" in the last 168 hours. Coagulation Profile: No results for input(s): "INR", "PROTIME" in the last 168 hours. Cardiac Enzymes: No results for input(s):  "CKTOTAL", "CKMB", "CKMBINDEX", "TROPONINI" in the last 168 hours. BNP (last 3 results) No results for input(s): "PROBNP" in the last 8760 hours. HbA1C: No results for input(s): "HGBA1C" in the last 72 hours. CBG: No results for input(s): "GLUCAP" in the last 168 hours. Lipid Profile: No results for input(s): "CHOL", "HDL", "LDLCALC", "TRIG", "CHOLHDL", "LDLDIRECT" in the last 72 hours. Thyroid Function Tests: Recent Labs    10/09/23 1259  TSH 6.354*   Anemia Panel: No results for input(s): "VITAMINB12", "FOLATE", "FERRITIN", "TIBC", "IRON", "RETICCTPCT" in the last 72 hours. Sepsis Labs: No results for input(s): "PROCALCITON", "LATICACIDVEN" in the last 168 hours.  Recent Results (from the past 240 hour(s))  Urine Culture     Status: Abnormal   Collection Time: 10/02/23  3:22 PM   Specimen: Urine, Clean Catch  Result Value Ref Range Status   Specimen Description   Final    URINE, CLEAN CATCH Performed at [facility], [address] [place], [place] [address]  Special Requests   Final    NONE Reflexed from [id number] Performed at [facility], [address], [place], [place] [address]    Culture (A)  Final    >=100,000 COLONIES/mL KLEBSIELLA PNEUMONIAE >=100,000 COLONIES/mL AEROCOCCUS URINAE    Report Status 10/05/2023 FINAL  Final   Organism ID, Bacteria KLEBSIELLA PNEUMONIAE (A)  Final      Susceptibility   Klebsiella pneumoniae - MIC*    AMPICILLIN >=32 RESISTANT Resistant     CEFAZOLIN <=4 SENSITIVE Sensitive     CEFEPIME <=0.12 SENSITIVE Sensitive     CEFTRIAXONE <=0.25 SENSITIVE Sensitive     CIPROFLOXACIN <=0.25 SENSITIVE Sensitive     GENTAMICIN <=1 SENSITIVE Sensitive     IMIPENEM <=0.25 SENSITIVE Sensitive     NITROFURANTOIN 32 SENSITIVE Sensitive     TRIMETH/SULFA <=20 SENSITIVE Sensitive     AMPICILLIN/SULBACTAM 4 SENSITIVE Sensitive     PIP/TAZO <=4 SENSITIVE Sensitive ug/mL    * >=100,000 COLONIES/mL KLEBSIELLA PNEUMONIAE      Radiology Studies: US RENAL  Result Date: 10/09/2023 CLINICAL DATA:  Acute kidney injury EXAM: RENAL / URINARY TRACT ULTRASOUND COMPLETE COMPARISON:  03/03/2023 FINDINGS: Right Kidney: Renal measurements: 8.7 x 5 x 6.4 cm = volume: 146 mL. Diffuse parenchymal thinning with increased parenchymal echotexture suggesting chronic medical renal disease. Small circumscribed cysts are demonstrated, largest measuring 3.1 cm maximal diameter. No change since prior study. No imaging follow-up is indicated. No hydronephrosis or solid mass. Left Kidney: Renal measurements: 9.5 x 5.7 x 6 cm = volume: 170 mL. Diffuse parenchymal thinning with increased parenchymal echotexture suggesting chronic medical renal disease. Small circumscribed cysts are demonstrated, largest measuring 3.4 cm maximal diameter. No change since prior study. No imaging follow-up is indicated. No hydronephrosis or solid mass. Bladder: Appears normal for degree of bladder distention. Other: None. IMPRESSION: Bilateral renal parenchymal thinning and increased echotexture suggesting chronic medical renal disease. No hydronephrosis. Electronically Signed   By: Burman Nieves M.D.   On: 10/09/2023 22:11   DG HIPS BILAT WITH PELVIS 2V  Result Date: 10/09/2023 CLINICAL DATA:  Hip pain EXAM: DG HIP (WITH OR WITHOUT PELVIS) 2V BILAT COMPARISON:  None Available. FINDINGS: SI joint degenerative change. Pubic symphysis appears intact. No acute fracture or malalignment. Probable stool within the rectum overlying the pubic symphysis. Mild bilateral hip degenerative changes. Vascular calcifications. IMPRESSION: Mild bilateral hip degenerative changes. Electronically Signed   By: Jasmine Pang M.D.   On: 10/09/2023 15:06     Scheduled Meds:  amLODipine  10 mg Oral Daily   vitamin C  1,000 mg Oral Daily   aspirin EC  81 mg Oral Q M,W,F   enoxaparin (LOVENOX) injection  30 mg Subcutaneous Q24H   hydrALAZINE  25 mg Oral Q8H   multivitamin with minerals  1  tablet Oral Q breakfast   olopatadine  1 drop Both Eyes BID   mouth rinse  15 mL Mouth Rinse 4 times per day   QUEtiapine  25 mg Oral QHS   simvastatin  20 mg Oral QPM   sodium chloride  1 g Oral TID WC   Continuous Infusions:  cefTRIAXone (ROCEPHIN)  IV 1 g (10/09/23 1801)     LOS: 7 days    Time spent: 38 min  Alwyn Ren, MD 10/10/2023, 12:06 PM

## 2023-10-11 DIAGNOSIS — R338 Other retention of urine: Secondary | ICD-10-CM | POA: Diagnosis not present

## 2023-10-11 DIAGNOSIS — E0781 Sick-euthyroid syndrome: Secondary | ICD-10-CM

## 2023-10-11 DIAGNOSIS — G9341 Metabolic encephalopathy: Secondary | ICD-10-CM

## 2023-10-11 DIAGNOSIS — R5381 Other malaise: Secondary | ICD-10-CM

## 2023-10-11 DIAGNOSIS — N3000 Acute cystitis without hematuria: Secondary | ICD-10-CM | POA: Diagnosis not present

## 2023-10-11 DIAGNOSIS — E871 Hypo-osmolality and hyponatremia: Secondary | ICD-10-CM | POA: Diagnosis not present

## 2023-10-11 LAB — COMPREHENSIVE METABOLIC PANEL
ALT: 369 U/L — ABNORMAL HIGH (ref 0–44)
AST: 324 U/L — ABNORMAL HIGH (ref 15–41)
Albumin: 2.7 g/dL — ABNORMAL LOW (ref 3.5–5.0)
Alkaline Phosphatase: 293 U/L — ABNORMAL HIGH (ref 38–126)
Anion gap: 11 (ref 5–15)
BUN: 40 mg/dL — ABNORMAL HIGH (ref 8–23)
CO2: 21 mmol/L — ABNORMAL LOW (ref 22–32)
Calcium: 9.4 mg/dL (ref 8.9–10.3)
Chloride: 92 mmol/L — ABNORMAL LOW (ref 98–111)
Creatinine, Ser: 1.63 mg/dL — ABNORMAL HIGH (ref 0.44–1.00)
GFR, Estimated: 29 mL/min — ABNORMAL LOW (ref >60)
Glucose, Bld: 124 mg/dL — ABNORMAL HIGH (ref 70–99)
Potassium: 4.1 mmol/L (ref 3.5–5.1)
Sodium: 124 mmol/L — ABNORMAL LOW (ref 135–145)
Total Bilirubin: 0.6 mg/dL (ref <1.2)
Total Protein: 6.2 g/dL — ABNORMAL LOW (ref 6.5–8.1)

## 2023-10-11 LAB — CBC
HCT: 25.5 % — ABNORMAL LOW (ref 36.0–46.0)
Hemoglobin: 8.9 g/dL — ABNORMAL LOW (ref 12.0–15.0)
MCH: 29.3 pg (ref 26.0–34.0)
MCHC: 34.9 g/dL (ref 30.0–36.0)
MCV: 83.9 fL (ref 80.0–100.0)
Platelets: 202 10*3/uL (ref 150–400)
RBC: 3.04 MIL/uL — ABNORMAL LOW (ref 3.87–5.11)
RDW: 15 % (ref 11.5–15.5)
WBC: 8 10*3/uL (ref 4.0–10.5)
nRBC: 0 % (ref 0.0–0.2)

## 2023-10-11 LAB — T3, FREE: T3, Free: 1.4 pg/mL — ABNORMAL LOW (ref 2.0–4.4)

## 2023-10-11 MED ORDER — UREA 15 G PO PACK
15.0000 g | PACK | Freq: Two times a day (BID) | ORAL | Status: DC
  Administered 2023-10-11 (×2): 15 g via ORAL
  Filled 2023-10-11 (×3): qty 1

## 2023-10-11 MED ORDER — LACTULOSE 10 GM/15ML PO SOLN
30.0000 g | Freq: Two times a day (BID) | ORAL | Status: DC
  Administered 2023-10-11 – 2023-10-14 (×4): 30 g via ORAL
  Filled 2023-10-11 (×7): qty 45

## 2023-10-11 MED ORDER — SENNOSIDES-DOCUSATE SODIUM 8.6-50 MG PO TABS
1.0000 | ORAL_TABLET | Freq: Two times a day (BID) | ORAL | Status: DC
  Administered 2023-10-11 – 2023-10-15 (×7): 1 via ORAL
  Filled 2023-10-11 (×8): qty 1

## 2023-10-11 NOTE — Assessment & Plan Note (Addendum)
-   Evaluated by urology as well, appreciate assistance - Still having ongoing incomplete bladder emptying with high residuals but she is however voiding - due to hypothermia and increased bladder scanning again on 12/6, decision made for foley placement when seen by urology; agree with placement - will check UA off foley specimen

## 2023-10-11 NOTE — Assessment & Plan Note (Addendum)
-   Multifactorial at this point from mostly hospital delirium given prolonged hospitalization but also some contribution from electrolyte derangements; lower suspicion from untreated UTI -Continue delirium precautions and reorienting patient is able -Ideally would like to get patient home back to normal environment -Okay to continue Seroquel and trazodone at night for now -Increased trazodone and seroquel dose on 12/5 and patient did sleep better but remained somnolent most of 12/6; will decrease doses if she awakens enough to take meds again, but will need to allow time for wash out - also given excessive lethargy on 12/6, will also check NH3 and VBG for thoroughness.  Both normal and reassuring -Mentation was improved on 10/14/2023 and patient was awake, alert, and talking better -Even more awake and alert on 10/15/2023

## 2023-10-11 NOTE — Assessment & Plan Note (Signed)
-   TSH 6.354 - FT4 considered normal 1.15; low free T3 as expected given rapid physiologic metabolism -No further workup indicated - Have recommended daughter to have repeat TSH and free T4 outpatient once patient is back to baseline from acute illness, tentatively in approximately 2 months

## 2023-10-11 NOTE — Progress Notes (Addendum)
[name]  Assessment/ Plan: Pt is a 89 y.o. yo female   with past medical history significant for HTN, DM, HLD, recurrent UTI, hyponatremia, CKD, presented with UTI, seen as a consultation for the evaluation of hyponatremia.   # Acute on chronic hyponatremia, hypervolemic on exam: Patient with history of chronic hyponatremia thought to be due to combination of HCTZ and SIADH in the past.  Probably also contributed by urinary retention.  TSH abnormal, may need Synthroid, defer to primary team.  Urine lites and osmolality with mixed picture. Treated with IV Lasix twice with initial some improvement but sodium level dropped today.  I will continue salt tablet and add Na-urea.  Hold diuretics today but I think she will need some Lasix on discharge.  Order bladder scan to make sure that she has no persistent urinary retention.  # CKD 3B: Serum creatinine level around baseline.  Continue to monitor.   # Hyperkalemia: Improved with Lokelma and Lasix.  Recommend low potassium diet for now.   # Acute urinary retention: Apparently patient was diagnosed with bladder spasm and follows with urologist.  She has appointment upcoming.  Kidney ultrasound with bilateral cortical thinning but no hydronephrosis.  Seen by urologist, agree with Flomax.  I will order bladder scan.    # History of hypertension: BP acceptable.  Not on ACE inhibitor or ARB.  # Delirium: Patient's daughter was concerned about delirium last night.  I informed her that because of her age and being in the hospital, she is at high risk of delirium.  I tried to discuss goals of care with her including quality of life etc.  For now continue full scope of treatment.  # Transaminitis: Noted liver enzymes are elevated.  Further management per primary team.  Subjective: Seen and examined at bedside.  Reportedly the patient was delirious last night.  Urine output was recorded 400 cc however for more urine  output recorded after this morning.  Patient is somnolent.  Her daughter was presented at the bedside. Objective Vital signs in last 24 hours: Vitals:   10/10/23 2139 10/11/23 0539 10/11/23 0541 10/11/23 0847  BP: (!) 137/55 (!) 140/61 (!) 140/61 135/61  Pulse:  83  78  Resp:  16  16  Temp:  (!) 97.5 F (36.4 C)  97.6 F (36.4 C)  TempSrc:  Axillary  Oral  SpO2:  96%  99%  Weight:      Height:       Weight change:   Intake/Output Summary (Last 24 hours) at 10/11/2023 1122 Last data filed at 10/11/2023 0945 Gross per 24 hour  Intake 290 ml  Output 1050 ml  Net -760 ml       Labs: RENAL PANEL Recent Labs  Lab 10/05/23 0606 10/06/23 0600 10/07/23 0605 10/08/23 0542 10/09/23 1144 10/09/23 1939 10/09/23 2336 10/10/23 0609 10/11/23 0606  NA 128* 130* 130* 127* 124* 127* 123* 127* 124*  K 4.3 4.3 4.4 4.7 5.9* 6.7* 4.4 4.5 4.1  CL 98 99 101 98 98 98 93* 97* 92*  CO2 19* 22 20* 22 18* 17* 22 20* 21*  GLUCOSE 108* 84 75 84 100* 124* 126* 93 124*  BUN 16 21 20 21  27* 29* 29* 30* 40*  CREATININE 1.21* 1.19* 1.26* 1.26* 1.35* 1.38* 1.39* 1.40* 1.63*  CALCIUM 9.9 9.6 9.2 9.4 9.1 9.9 9.4 9.7 9.4  MG 2.4 2.4 2.5* 2.3  --   --   --   --   --  ALBUMIN  --   --   --   --   --   --   --  2.9* 2.7*    Liver Function Tests: Recent Labs  Lab 10/10/23 0609 10/11/23 0606  AST 415* 324*  ALT 397* 369*  ALKPHOS 254* 293*  BILITOT 0.4 0.6  PROT 6.5 6.2*  ALBUMIN 2.9* 2.7*   No results for input(s): "LIPASE", "AMYLASE" in the last 168 hours. No results for input(s): "AMMONIA" in the last 168 hours. CBC: Recent Labs    08/22/23 1506 08/23/23 0453 10/05/23 0606 10/06/23 0600 10/07/23 0605 10/08/23 0542 10/11/23 0606  HGB  --    < > 10.6* 9.5* 8.9* 9.7* 8.9*  MCV  --    < > 88.9 88.5 89.3 85.8 83.9  VITAMINB12 1,001*  --   --   --   --   --   --   FOLATE 30.8  --   --   --   --   --   --   FERRITIN 136  --   --   --   --   --   --   TIBC 252  --   --   --   --   --    --   IRON 19*  --   --   --   --   --   --   RETICCTPCT 1.5  --   --   --   --   --   --    < > = values in this interval not displayed.    Cardiac Enzymes: No results for input(s): "CKTOTAL", "CKMB", "CKMBINDEX", "TROPONINI" in the last 168 hours. CBG: No results for input(s): "GLUCAP" in the last 168 hours.  Iron Studies: No results for input(s): "IRON", "TIBC", "TRANSFERRIN", "FERRITIN" in the last 72 hours. Studies/Results: US RENAL  Result Date: 10/09/2023 CLINICAL DATA:  Acute kidney injury EXAM: RENAL / URINARY TRACT ULTRASOUND COMPLETE COMPARISON:  03/03/2023 FINDINGS: Right Kidney: Renal measurements: 8.7 x 5 x 6.4 cm = volume: 146 mL. Diffuse parenchymal thinning with increased parenchymal echotexture suggesting chronic medical renal disease. Small circumscribed cysts are demonstrated, largest measuring 3.1 cm maximal diameter. No change since prior study. No imaging follow-up is indicated. No hydronephrosis or solid mass. Left Kidney: Renal measurements: 9.5 x 5.7 x 6 cm = volume: 170 mL. Diffuse parenchymal thinning with increased parenchymal echotexture suggesting chronic medical renal disease. Small circumscribed cysts are demonstrated, largest measuring 3.4 cm maximal diameter. No change since prior study. No imaging follow-up is indicated. No hydronephrosis or solid mass. Bladder: Appears normal for degree of bladder distention. Other: None. IMPRESSION: Bilateral renal parenchymal thinning and increased echotexture suggesting chronic medical renal disease. No hydronephrosis. Electronically Signed   By: Burman Nieves M.D.   On: 10/09/2023 22:11    Medications: Infusions:  cefTRIAXone (ROCEPHIN)  IV 1 g (10/10/23 1652)    Scheduled Medications:  amLODipine  10 mg Oral Daily   vitamin C  1,000 mg Oral Daily   aspirin EC  81 mg Oral Q M,W,F   enoxaparin (LOVENOX) injection  30 mg Subcutaneous Q24H   hydrALAZINE  25 mg Oral Q8H   multivitamin with minerals  1 tablet Oral Q  breakfast   olopatadine  1 drop Both Eyes BID   mouth rinse  15 mL Mouth Rinse 4 times per day   QUEtiapine  25 mg Oral QHS   sodium chloride  1 g Oral TID WC  tamsulosin  0.4 mg Oral Daily    have reviewed scheduled and prn medications.  Physical Exam: General:NAD, comfortable Heart:RRR, s1s2 nl Lungs:clear b/l, no crackle Abdomen:soft, Non-tender, non-distended Extremities: Bilateral upper and lower extremity edema present. Neurology: Somnolent but opens eyes with the name.  Joanne Patterson Elpidio Anis Jacub Waiters 10/11/2023,11:22 AM  LOS: 8 days

## 2023-10-11 NOTE — Progress Notes (Signed)
Physical Therapy Treatment Patient Details Name: Joanne Patterson MRN: [id number] DOB: November 29, 1927 Today's Date: 10/11/2023   History of Present Illness Pt is a 89 y.o. female admitted with UTI. PMH significant for HTN, type II DM, hyperlipidemia, recurrent UTI and hyponatremia, L heel wound, R ankle sprain 08/2023, falls    PT Comments   Pt admitted with above diagnosis.  Pt currently with functional limitations due to the deficits listed below (see PT Problem List).  Pt in bed when PT arrived. Pt required cues for attention with fixed gaze up and to the L noted, daughter later reported onset last night and pt is speaking with family members whom have passed and reminiscing about the job she retired from 30 years ago. Pt required increased cues and physical assist with increased time for motor processing and planning with one step direction as compared to 12/3 intervention. Pt required mod A x 2 for bed mobility, mod/max A for sit to stand  from EOB with 3 attempts. Pt required max encouragement and min A x 2 for gait tasks 5 feet with facilitation for weight shifting, cues for extension posture and sequencing for LE advancement. Noted B LE edema with R knee tender to touch. Pt left seated in recliner and all needs in place, daughter present and nursing staff aware of recommendation for A x 2 with steady.  Pt will benefit from acute skilled PT to increase their independence and safety with mobility to allow discharge.      If plan is discharge home, recommend the following: Assistance with cooking/housework;Assist for transportation;Help with stairs or ramp for entrance;A lot of help with walking and/or transfers;A lot of help with bathing/dressing/bathroom   Can travel by private vehicle     No  Equipment Recommendations  Rolling walker (2 wheels)    Recommendations for Other Services OT consult     Precautions / Restrictions Precautions Precautions: Fall Precaution Comments: poor  cognition Restrictions Weight Bearing Restrictions: No     Mobility  Bed Mobility Overal bed mobility: Needs Assistance Bed Mobility: Supine to Sit     Supine to sit: +2 for safety/equipment, +2 for physical assistance, Mod assist     General bed mobility comments: increased time and cues for proper movements to advance LEs to EOB and directions to turn head to look for bed rails with pt unable to use today with mod A for trunk, pt able to scoot in sitting to EOB with encouragement and increasd time,.however pt was not able to scoot posteriorly to assist to reposition once seated in recliner    Transfers Overall transfer level: Needs assistance Equipment used: Rolling walker (2 wheels) Transfers: Sit to/from Stand Sit to Stand: Mod assist, From elevated surface, Max assist           General transfer comment: multimodal cues increased time and pull to stand at RW, mod A for initial standing balance pt reported fear of falling and required constant cues for posture    Ambulation/Gait Ambulation/Gait assistance: Min assist, +2 safety/equipment Gait Distance (Feet): 5 Feet Assistive device: Rolling walker (2 wheels) Gait Pattern/deviations: Step-to pattern, Antalgic, Trunk flexed Gait velocity: decreased     General Gait Details: max cues for encouragement , step to pattern with cues for LE advacement and A for RW and balance, facilitation for weight shifting and recliner close behind   Optometrist     Tilt Bed  Modified Rankin (Stroke Patients Only)       Balance Overall balance assessment: Needs assistance, History of Falls Sitting-balance support: Feet supported Sitting balance-Leahy Scale: Fair     Standing balance support: Reliant on assistive device for balance, During functional activity, Bilateral upper extremity supported Standing balance-Leahy Scale: Zero                              Cognition  Arousal: Lethargic Behavior During Therapy: Flat affect Overall Cognitive Status: Impaired/Different from baseline Area of Impairment: Following commands, Problem solving, Safety/judgement                 Orientation Level: Place, Time, Situation, Disoriented to   Memory: Decreased short-term memory Following Commands: Follows one step commands with increased time Safety/Judgement: Decreased awareness of safety, Decreased awareness of deficits   Problem Solving: Difficulty sequencing, Decreased initiation, Requires verbal cues, Requires tactile cues, Slow processing General Comments: patient was noted to have moments during session with looking up and off to L side (fixed gaze) with patient reporting seeing "something" but unable to articulate more. daughter who was present reported this started last night and that patient had been calling out and speaking with deceased loved ones. nurse made aware. of note patietns sodium level is 126 today.        Exercises      General Comments General comments (skin integrity, edema, etc.): nurse made aware of change in presentation including fixed gaze up and to the L, recommendation for steady lift with A x 2 and B LE edema and pain      Pertinent Vitals/Pain Pain Assessment Pain Assessment: Faces Faces Pain Scale: Hurts even more Breathing: occasional labored breathing, short period of hyperventilation Negative Vocalization: occasional moan/groan, low speech, negative/disapproving quality Facial Expression: sad, frightened, frown Body Language: tense, distressed pacing, fidgeting Consolability: distracted or reassured by voice/touch PAINAD Score: 5 Pain Location: B LE R > L Pain Descriptors / Indicators: Constant, Discomfort, Grimacing Pain Intervention(s): Limited activity within patient's tolerance, Monitored during session, Repositioned    Home Living                          Prior Function            PT Goals  (current goals can now be found in the care plan section) Acute Rehab PT Goals Patient Stated Goal: per daughter, for pt to get better, stronger and return home PT Goal Formulation: With family Time For Goal Achievement: 10/17/23 Potential to Achieve Goals: Fair Progress towards PT goals: Progressing toward goals    Frequency    Min 1X/week      PT Plan      Co-evaluation              AM-PAC PT "6 Clicks" Mobility   Outcome Measure  Help needed turning from your back to your side while in a flat bed without using bedrails?: A Lot Help needed moving from lying on your back to sitting on the side of a flat bed without using bedrails?: A Lot Help needed moving to and from a bed to a chair (including a wheelchair)?: A Lot Help needed standing up from a chair using your arms (e.g., wheelchair or bedside chair)?: A Lot Help needed to walk in hospital room?: A Lot Help needed climbing 3-5 steps with a railing? : Total 6 Click Score: 11  End of Session Equipment Utilized During Treatment: Gait belt Activity Tolerance: Patient limited by fatigue;Patient limited by pain Patient left: with call bell/phone within reach;with family/visitor present;in chair Nurse Communication: Mobility status;Need for lift equipment PT Visit Diagnosis: Muscle weakness (generalized) (M62.81);Unsteadiness on feet (R26.81);History of falling (Z91.81);Pain;Difficulty in walking, not elsewhere classified (R26.2)     Time: 6440-3474 PT Time Calculation (min) (ACUTE ONLY): 27 min  Charges:    $Gait Training: 8-22 mins $Therapeutic Activity: 8-22 mins PT General Charges $$ ACUTE PT VISIT: 1 Visit                     Johnny Bridge, PT Acute Rehab    Jacqualyn Posey 10/11/2023, 11:32 AM

## 2023-10-11 NOTE — Assessment & Plan Note (Signed)
-   Worsening frailty during hospitalization and patient now 2+ assist.  Daughter still declining SNF and plans on bringing patient home with ongoing home health

## 2023-10-11 NOTE — Progress Notes (Signed)
Bladder scan completed, 200 cc. Patient turned in bed left to right and incontinent x1 urine.

## 2023-10-11 NOTE — Progress Notes (Signed)
Occupational Therapy Treatment Patient Details Name: Joanne Patterson MRN: [id number] DOB: February 17, 1928 Today's Date: 10/11/2023   History of present illness Pt is a 89 y.o. female admitted with UTI. PMH significant for HTN, type II DM, hyperlipidemia, recurrent UTI and hyponatremia, L heel wound, R ankle sprain 08/2023, falls   OT comments  Patient was noted to require more A to advance out of bed today with patient having moments of staring up and to L during session with reports of seeing "something:" but unable to verbalize what. Nurse made aware. Daughter who was present in room reported patient started doing this last night. Patient was +2 with physical A to weight shift steps forwards to advance into recliner today.  Patient will benefit from continued inpatient follow up therapy, <3 hours/day.        If plan is discharge home, recommend the following:  A lot of help with walking and/or transfers;A lot of help with bathing/dressing/bathroom;Assistance with cooking/housework;Assistance with feeding;Direct supervision/assist for medications management;Direct supervision/assist for financial management;Assist for transportation;Help with stairs or ramp for entrance;Supervision due to cognitive status   Equipment Recommendations  None recommended by OT       Precautions / Restrictions Precautions Precautions: Fall Restrictions Weight Bearing Restrictions: No       Mobility Bed Mobility Overal bed mobility: Needs Assistance Bed Mobility: Supine to Sit     Supine to sit: +2 for safety/equipment, +2 for physical assistance, Mod assist     General bed mobility comments: increased time and cues for proper movements to advacne to EOB                   Balance Overall balance assessment: Needs assistance, History of Falls Sitting-balance support: Feet supported Sitting balance-Leahy Scale: Fair     Standing balance support: Reliant on assistive device for balance, During  functional activity, Bilateral upper extremity supported Standing balance-Leahy Scale: Zero         ADL either performed or assessed with clinical judgement   ADL Overall ADL's : Needs assistance/impaired       Grooming Details (indicate cue type and reason): patient declined to engage in theses tasks with daughter reporting she did them for her earlier this AM.                 Toilet Transfer: +2 for physical assistance;+2 for safety/equipment;Minimal assistance;Ambulation;Rolling walker (2 wheels) Toilet Transfer Details (indicate cue type and reason): patient required max A x2 for sit to stand with increased time to stand. patietn completed sit to stands x3 with increased fear of falling with movement. patient able to take few steps in room to progress towards door with patient crying out with movements and needing physical A to weight shift task.           General ADL Comments: patient was unable to follow ROM cues for cervical area. when asked to touch ear to shoulder patient brought both hands up and touched ear. unable to follow two step commands.      Cognition Arousal: Lethargic Behavior During Therapy: Flat affect Overall Cognitive Status: Impaired/Different from baseline         General Comments: patient was noted to have moments during session with looking up and off to L side with patient reporting seeing "something" but unable to articulate more. daughter who was present reported this started last night and that patient had been calling out and speaking with deceased loved ones. nurse made aware. of note patietns sodium  level is 126 today.                   Pertinent Vitals/ Pain       Pain Assessment Pain Assessment: Faces Faces Pain Scale: Hurts whole lot Pain Location: B LE R > L Pain Descriptors / Indicators: Constant, Discomfort, Grimacing Pain Intervention(s): Limited activity within patient's tolerance, Monitored during session,  Repositioned         Frequency  Min 1X/week        Progress Toward Goals  OT Goals(current goals can now be found in the care plan section)  Progress towards OT goals: Progressing toward goals     Plan         AM-PAC OT "6 Clicks" Daily Activity     Outcome Measure   Help from another person eating meals?: A Little Help from another person taking care of personal grooming?: A Little Help from another person toileting, which includes using toliet, bedpan, or urinal?: A Lot Help from another person bathing (including washing, rinsing, drying)?: A Lot Help from another person to put on and taking off regular upper body clothing?: A Little Help from another person to put on and taking off regular lower body clothing?: A Lot 6 Click Score: 15    End of Session Equipment Utilized During Treatment: Rolling walker (2 wheels);Gait belt  OT Visit Diagnosis: Repeated falls (R29.6);Muscle weakness (generalized) (M62.81);Unsteadiness on feet (R26.81);Other symptoms and signs involving cognitive function   Activity Tolerance Patient limited by fatigue   Patient Left in chair;with call bell/phone within reach;with family/visitor present;with nursing/sitter in room   Nurse Communication Other (comment) (concerns over patients left and upward gaze at start and at times during session.)        Time: 1610-9604 OT Time Calculation (min): 26 min  Charges: OT General Charges $OT Visit: 1 Visit OT Treatments $Therapeutic Activity: 8-22 mins  Rosalio Loud, MS Acute Rehabilitation Department Office# [phone]   Selinda Flavin 10/11/2023, 10:47 AM

## 2023-10-11 NOTE — Plan of Care (Signed)

## 2023-10-11 NOTE — Progress Notes (Signed)
Progress Note    Joanne Patterson   ZOX:[id number]  DOB: September 01, 1928  DOA: 10/02/2023     8 PCP: Jarrett Soho, PA-C  Initial CC: Hallucinations  Hospital Course: Ms. Joanne Patterson is a 89 yo female with PMH recurrent UTIs, hypertension, DM II, HLD who presented up on recommendation from Center well after outpatient urine culture came back positive. Patient's daughter also provided collateral information stating that the patient was continuing to become more confused and hallucinating prior to admission.  They had obtained urine specimen on 09/26/2023 and had not yet started/been prescribed antibiotics prior to hospitalization. UA on admission showed large LE, negative nitrite, greater than 50 WBC.  She was started on Rocephin and admitted for further workup and monitoring.  Urine culture grew Klebsiella and Aerococcus. She completed 10-day course of Rocephin during hospitalization.  Interval History:  Patient known to me from last week on service.  Daughter present bedside.  We reviewed workup thus far and tried reassuring her that most of her mentation changes delirium especially from prolonged hospitalization. Patient definitely appears very deconditioned compared to last time I saw her.  Daughter still wishing for bringing her home with home health and declines SNF.  Assessment and Plan: * Acute cystitis-resolved as of 10/11/2023 - Main symptom appears to be hallucinations and altered mentation on admission.  Original urine specimen obtained 09/26/2023 outpatient and sent to [facility] for testing; appears patient told culture was positive on 11/25 and to go to ER - obtained fax report and is placed in chart; unfortunately only says "mixed urogenital flora, > 100k colonies). No other micro data sent -Urine culture inpatient grew Klebsiella and Aerococcus.  Patient completed 10-day course of Rocephin during hospitalization  Hyponatremia - Level has been fluctuating during hospitalization -  Nephrology has also been consulted, appreciate assistance - Some concern for underlying component of SIADH - Continue sodium chloride tablets - Urea added 10/11/2023 per nephrology - continue trending   Acute metabolic encephalopathy - Multifactorial at this point from mostly hospital delirium given prolonged hospitalization but also some contribution from electrolyte derangements; lower suspicion from untreated UTI -Continue delirium precautions and reorienting patient is able -Ideally would like to get patient home back to normal environment -Okay to continue Seroquel and trazodone at night for now  Acute urinary retention - Evaluated by urology as well, appreciate assistance - Still having ongoing incomplete bladder emptying with high residuals but she is however voiding -Continue with bladder scans; if creatinine does further elevate, will rediscuss Foley need with urology, or if PVRs continue to further uptrend  Chronic kidney disease, stage 3b (HCC) - patient has history of CKD3b. Baseline creat ~ 1.3 - 1.5, eGFR~ 35   Euthyroid sick syndrome - TSH 6.354 - FT4 considered normal 1.15; low free T3 as expected given rapid physiologic metabolism -No further workup indicated - Have recommended daughter to have repeat TSH and free T4 outpatient once patient is back to baseline from acute illness, tentatively in approximately 2 months  Physical deconditioning - Worsening frailty during hospitalization and patient now 2+ assist.  Daughter still declining SNF and plans on bringing patient home with ongoing home health  Essential hypertension - ARB on hold - Lasix being dosed intermittently - Continue amlodipine  Hyperlipidemia - Continue Zocor   Old records reviewed in assessment of this patient  Antimicrobials: Rocephin 10/02/2023 >> 10/11/23  DVT prophylaxis:  enoxaparin (LOVENOX) injection 30 mg Start: 10/09/23 2200 SCDs Start: 10/02/23 1650   Code Status:   Code  Status: Full Code  Mobility Assessment (Last 72 Hours)     Mobility Assessment     Row Name 10/11/23 1100 10/11/23 1046 10/11/23 0800 10/10/23 1956 10/10/23 1600   Does patient have an order for bedrest or is patient medically unstable -- -- No - Continue assessment No - Continue assessment --   What is the highest level of mobility based on the progressive mobility assessment? Level 4 (Walks with assist in room) - Balance while marching in place and cannot step forward and back - Complete Level 2 (Chairfast) - Balance while sitting on edge of bed and cannot stand Level 4 (Walks with assist in room) - Balance while marching in place and cannot step forward and back - Complete Level 2 (Chairfast) - Balance while sitting on edge of bed and cannot stand Level 5 (Walks with assist in room/hall) - Balance while stepping forward/back and can walk in room with assist - Complete   Is the above level different from baseline mobility prior to current illness? -- -- Yes - Recommend PT order Yes - Recommend PT order --    Row Name 10/10/23 1300 10/09/23 1955 10/09/23 1355 10/08/23 2232     Does patient have an order for bedrest or is patient medically unstable No - Continue assessment No - Continue assessment No - Continue assessment No - Continue assessment    What is the highest level of mobility based on the progressive mobility assessment? Level 2 (Chairfast) - Balance while sitting on edge of bed and cannot stand Level 2 (Chairfast) - Balance while sitting on edge of bed and cannot stand Level 2 (Chairfast) - Balance while sitting on edge of bed and cannot stand Level 3 (Stands with assist) - Balance while standing  and cannot march in place    Is the above level different from baseline mobility prior to current illness? -- Yes - Recommend PT order Yes - Recommend PT order  MD notified of patient's weakness. Yes - Recommend PT order             Barriers to discharge: none Disposition Plan:   Home Status is: Inpt  Objective: Blood pressure (!) 121/44, pulse 79, temperature 97.6 F (36.4 C), temperature source Oral, resp. rate 18, height 5\' 6"  (1.676 m), weight 85.7 kg, SpO2 100%.  Examination:  Physical Exam Constitutional:      Appearance: Normal appearance.     Comments: Frail and deconditioned appearing with obvious confusion  HENT:     Head: Normocephalic and atraumatic.     Mouth/Throat:     Mouth: Mucous membranes are moist.  Eyes:     Extraocular Movements: Extraocular movements intact.  Cardiovascular:     Rate and Rhythm: Normal rate and regular rhythm.  Pulmonary:     Effort: Pulmonary effort is normal. No respiratory distress.     Breath sounds: Normal breath sounds. No wheezing.  Abdominal:     General: Bowel sounds are normal. There is no distension.     Palpations: Abdomen is soft.     Tenderness: There is no abdominal tenderness.  Musculoskeletal:        General: Swelling (3+ B/L LE) present. Normal range of motion.     Cervical back: Normal range of motion and neck supple.  Skin:    General: Skin is warm and dry.  Neurological:     Mental Status: She is disoriented.     Motor: Weakness (generalized but worse in LE) present.  Consultants:    Procedures:    Data Reviewed: Results for orders placed or performed during the hospital encounter of 10/02/23 (from the past 24 hour(s))  Comprehensive metabolic panel     Status: Abnormal   Collection Time: 10/11/23  6:06 AM  Result Value Ref Range   Sodium 124 (L) 135 - 145 mmol/L   Potassium 4.1 3.5 - 5.1 mmol/L   Chloride 92 (L) 98 - 111 mmol/L   CO2 21 (L) 22 - 32 mmol/L   Glucose, Bld 124 (H) 70 - 99 mg/dL   BUN 40 (H) 8 - 23 mg/dL   Creatinine, Ser 4.09 (H) 0.44 - 1.00 mg/dL   Calcium 9.4 8.9 - 81.1 mg/dL   Total Protein 6.2 (L) 6.5 - 8.1 g/dL   Albumin 2.7 (L) 3.5 - 5.0 g/dL   AST 914 (H) 15 - 41 U/L   ALT 369 (H) 0 - 44 U/L   Alkaline Phosphatase 293 (H) 38 - 126 U/L   Total  Bilirubin 0.6 <1.2 mg/dL   GFR, Estimated 29 (L) >60 mL/min   Anion gap 11 5 - 15  CBC     Status: Abnormal   Collection Time: 10/11/23  6:06 AM  Result Value Ref Range   WBC 8.0 4.0 - 10.5 K/uL   RBC 3.04 (L) 3.87 - 5.11 MIL/uL   Hemoglobin 8.9 (L) 12.0 - 15.0 g/dL   HCT 78.2 (L) 95.6 - 21.3 %   MCV 83.9 80.0 - 100.0 fL   MCH 29.3 26.0 - 34.0 pg   MCHC 34.9 30.0 - 36.0 g/dL   RDW 08.6 57.8 - 46.9 %   Platelets 202 150 - 400 K/uL   nRBC 0.0 0.0 - 0.2 %    I have reviewed pertinent nursing notes, vitals, labs, and images as necessary. I have ordered labwork to follow up on as indicated.  I have reviewed the last notes from staff over past 24 hours. I have discussed patient's care plan and test results with nursing staff, CM/SW, and other staff as appropriate.  Time spent: Greater than 50% of the 55 minute visit was spent in counseling/coordination of care for the patient as laid out in the A&P.   LOS: 8 days   Lewie Chamber, MD [facility] 10/11/2023, 4:41 PM

## 2023-10-12 DIAGNOSIS — R338 Other retention of urine: Secondary | ICD-10-CM | POA: Diagnosis not present

## 2023-10-12 DIAGNOSIS — G9341 Metabolic encephalopathy: Secondary | ICD-10-CM | POA: Diagnosis not present

## 2023-10-12 DIAGNOSIS — N3000 Acute cystitis without hematuria: Secondary | ICD-10-CM | POA: Diagnosis not present

## 2023-10-12 DIAGNOSIS — E871 Hypo-osmolality and hyponatremia: Secondary | ICD-10-CM | POA: Diagnosis not present

## 2023-10-12 LAB — COMPREHENSIVE METABOLIC PANEL
ALT: 331 U/L — ABNORMAL HIGH (ref 0–44)
AST: 262 U/L — ABNORMAL HIGH (ref 15–41)
Albumin: 2.8 g/dL — ABNORMAL LOW (ref 3.5–5.0)
Alkaline Phosphatase: 264 U/L — ABNORMAL HIGH (ref 38–126)
Anion gap: 10 (ref 5–15)
BUN: 67 mg/dL — ABNORMAL HIGH (ref 8–23)
CO2: 22 mmol/L (ref 22–32)
Calcium: 9.9 mg/dL (ref 8.9–10.3)
Chloride: 97 mmol/L — ABNORMAL LOW (ref 98–111)
Creatinine, Ser: 1.49 mg/dL — ABNORMAL HIGH (ref 0.44–1.00)
GFR, Estimated: 33 mL/min — ABNORMAL LOW (ref >60)
Glucose, Bld: 115 mg/dL — ABNORMAL HIGH (ref 70–99)
Potassium: 4.2 mmol/L (ref 3.5–5.1)
Sodium: 129 mmol/L — ABNORMAL LOW (ref 135–145)
Total Bilirubin: 0.2 mg/dL (ref <1.2)
Total Protein: 6 g/dL — ABNORMAL LOW (ref 6.5–8.1)

## 2023-10-12 LAB — CBC
HCT: 25.1 % — ABNORMAL LOW (ref 36.0–46.0)
Hemoglobin: 8.4 g/dL — ABNORMAL LOW (ref 12.0–15.0)
MCH: 28.6 pg (ref 26.0–34.0)
MCHC: 33.5 g/dL (ref 30.0–36.0)
MCV: 85.4 fL (ref 80.0–100.0)
Platelets: 202 10*3/uL (ref 150–400)
RBC: 2.94 MIL/uL — ABNORMAL LOW (ref 3.87–5.11)
RDW: 14.8 % (ref 11.5–15.5)
WBC: 6.3 10*3/uL (ref 4.0–10.5)
nRBC: 0 % (ref 0.0–0.2)

## 2023-10-12 LAB — MAGNESIUM: Magnesium: 2.8 mg/dL — ABNORMAL HIGH (ref 1.7–2.4)

## 2023-10-12 MED ORDER — FUROSEMIDE 20 MG PO TABS
20.0000 mg | ORAL_TABLET | Freq: Every day | ORAL | Status: DC
  Administered 2023-10-12 – 2023-10-15 (×4): 20 mg via ORAL
  Filled 2023-10-12 (×4): qty 1

## 2023-10-12 MED ORDER — TRAZODONE HCL 50 MG PO TABS
100.0000 mg | ORAL_TABLET | Freq: Every day | ORAL | Status: DC
  Administered 2023-10-12: 100 mg via ORAL
  Filled 2023-10-12: qty 2

## 2023-10-12 MED ORDER — QUETIAPINE FUMARATE 50 MG PO TABS
50.0000 mg | ORAL_TABLET | Freq: Every day | ORAL | Status: DC
  Administered 2023-10-12: 50 mg via ORAL
  Filled 2023-10-12: qty 1

## 2023-10-12 NOTE — Progress Notes (Signed)
RN discussed plan to mobilize patient to [facility]. The pt is not consistently following commands and unable to assist with bed mobility. Pt is a max assist.

## 2023-10-12 NOTE — Progress Notes (Addendum)
[name]  Assessment/ Plan: Pt is a 89 y.o. yo female   with past medical history significant for HTN, DM, HLD, recurrent UTI, hyponatremia, CKD, presented with UTI, seen as a consultation for the evaluation of hyponatremia.   # Acute on chronic hyponatremia, hypervolemic on exam: Patient with history of chronic hyponatremia thought to be due to combination of HCTZ and SIADH in the past.  Probably also contributed by urinary retention.  TSH abnormal, may need Synthroid, defer to primary team.  Urine lites and osmolality with mixed picture. Treated with IV Lasix for edema with improvement of serum sodium level.  I think frequent episode of hyponatremia is related with acute urinary retention.  Sodium level has improved to 129 today.  I will go ahead and discontinue urea tablet to prevent from rising BUN level.  Continue salt tablet and resume oral Lasix 20 mg.  Salt tablet can be tapered down in next few days and discontinue on discharge. Monitor lab and continue to follow at [facility].  # CKD 3B: Serum creatinine level around baseline.  Continue to monitor.   # Hyperkalemia: Improved with Lokelma and Lasix.  Recommend low potassium diet for now.   # Acute urinary retention: Apparently patient was diagnosed with bladder spasm and follows with urologist. Kidney ultrasound with bilateral cortical thinning but no hydronephrosis.  Discussed with the urologist team today at the bedside.  Continue bladder scan and management per urologist.  # History of hypertension: BP acceptable.  Not on ACE inhibitor or ARB.  # Delirium: Continue supportive care.  Possibly home when she is medically optimized in the hospital to prevent from worsening delirium.  I have discussed with the patient's daughter and granddaughter today.  # Transaminitis: Noted liver enzymes are elevated.  Further management per primary team.  I will sign off, please call us back  with question.  Subjective: Seen and examined at bedside.  Patient was sleeping this morning as she did not sleep last night.  Her daughter and granddaughter presented at the bedside and was talking with urology team.  Urine output is around 1.6 L.  No new event.  Labs better today.  Objective Vital signs in last 24 hours: Vitals:   10/11/23 2048 10/11/23 2129 10/12/23 0619 10/12/23 0648  BP: 129/66 129/66 104/75 104/75  Pulse: 76  71   Resp: 18  16   Temp: 97.6 F (36.4 C)     TempSrc: Oral     SpO2: 100%  98%   Weight:      Height:       Weight change:   Intake/Output Summary (Last 24 hours) at 10/12/2023 0848 Last data filed at 10/11/2023 1833 Gross per 24 hour  Intake 50 ml  Output 1650 ml  Net -1600 ml       Labs: RENAL PANEL Recent Labs  Lab 10/06/23 0600 10/07/23 0605 10/08/23 0542 10/09/23 1144 10/09/23 1939 10/09/23 2336 10/10/23 0609 10/11/23 0606 10/12/23 0702  NA 130* 130* 127*   < > 127* 123* 127* 124* 129*  K 4.3 4.4 4.7   < > 6.7* 4.4 4.5 4.1 4.2  CL 99 101 98   < > 98 93* 97* 92* 97*  CO2 22 20* 22   < > 17* 22 20* 21* 22  GLUCOSE 84 75 84   < > 124* 126* 93 124* 115*  BUN 21 20 21    < > 29* 29* 30* 40* 67*  CREATININE 1.19*  1.26* 1.26*   < > 1.38* 1.39* 1.40* 1.63* 1.49*  CALCIUM 9.6 9.2 9.4   < > 9.9 9.4 9.7 9.4 9.9  MG 2.4 2.5* 2.3  --   --   --   --   --  2.8*  ALBUMIN  --   --   --   --   --   --  2.9* 2.7* 2.8*   < > = values in this interval not displayed.    Liver Function Tests: Recent Labs  Lab 10/10/23 0609 10/11/23 0606 10/12/23 0702  AST 415* 324* 262*  ALT 397* 369* 331*  ALKPHOS 254* 293* 264*  BILITOT 0.4 0.6 0.2  PROT 6.5 6.2* 6.0*  ALBUMIN 2.9* 2.7* 2.8*   No results for input(s): "LIPASE", "AMYLASE" in the last 168 hours. No results for input(s): "AMMONIA" in the last 168 hours. CBC: Recent Labs    08/22/23 1506 08/23/23 0453 10/06/23 0600 10/07/23 0605 10/08/23 0542 10/11/23 0606 10/12/23 0702  HGB  --     < > 9.5* 8.9* 9.7* 8.9* 8.4*  MCV  --    < > 88.5 89.3 85.8 83.9 85.4  VITAMINB12 1,001*  --   --   --   --   --   --   FOLATE 30.8  --   --   --   --   --   --   FERRITIN 136  --   --   --   --   --   --   TIBC 252  --   --   --   --   --   --   IRON 19*  --   --   --   --   --   --   RETICCTPCT 1.5  --   --   --   --   --   --    < > = values in this interval not displayed.    Cardiac Enzymes: No results for input(s): "CKTOTAL", "CKMB", "CKMBINDEX", "TROPONINI" in the last 168 hours. CBG: No results for input(s): "GLUCAP" in the last 168 hours.  Iron Studies: No results for input(s): "IRON", "TIBC", "TRANSFERRIN", "FERRITIN" in the last 72 hours. Studies/Results: No results found.  Medications: Infusions:    Scheduled Medications:  amLODipine  10 mg Oral Daily   vitamin C  1,000 mg Oral Daily   aspirin EC  81 mg Oral Q M,W,F   enoxaparin (LOVENOX) injection  30 mg Subcutaneous Q24H   hydrALAZINE  25 mg Oral Q8H   lactulose  30 g Oral BID   multivitamin with minerals  1 tablet Oral Q breakfast   olopatadine  1 drop Both Eyes BID   mouth rinse  15 mL Mouth Rinse 4 times per day   QUEtiapine  25 mg Oral QHS   senna-docusate  1 tablet Oral BID   sodium chloride  1 g Oral TID WC   tamsulosin  0.4 mg Oral Daily   urea  15 g Oral BID    have reviewed scheduled and prn medications.  Physical Exam: General:NAD, comfortable, somnolent Heart:RRR, s1s2 nl Lungs:clear b/l, no crackle Abdomen:soft, Non-tender, non-distended Extremities: Bilateral upper and lower extremity edema present. Neurology: Somnolent but opens eyes with the name.  Joanne Patterson Prasad Shanta Dorvil 10/12/2023,8:48 AM  LOS: 9 days

## 2023-10-12 NOTE — Progress Notes (Signed)
     Subjective: Long conversation with the Carls family (daughter and granddaughter). Reviewed labs and plan, Nephrology was present. All questions were answered to their satisfaction. Pt experiencing hospital delirium. Reviewed steps to take in an effort to align circadian rhythm.   Objective: Vital signs in last 24 hours: Temp:  [97.6 F (36.4 C)] 97.6 F (36.4 C) (12/04 2048) Pulse Rate:  [71-79] 71 (12/05 0619) Resp:  [16-18] 16 (12/05 0619) BP: (104-129)/(44-75) 104/75 (12/05 0648) SpO2:  [98 %-100 %] 98 % (12/05 1478)  Assessment/Plan: #mixed incontinence Up to bedside commode Q6, bladder scan after all void.  Scr trending with her baseline, no hydronephrosis, hyponatremia improving.  Missing outpt appt today, family to reschedule for pelvic exam and diagnostic cystoscopy.  Resume daily suppressive Trimethoprim on discharge Ok to try flomax again, limited benefit for women with retention  #constipation Family reports frequent periods on constipation. Advised to use OTC daily Miralax, titrated to one BM per day. This will reduce the chance of bladder outlet obstruction 2/2 pelvic stool burden.   #hospital delirium Blinds opens at beginning of shift, lights out after dinner.  May benefit from melatonin Frequent reorientation  Intake/Output from previous day: 12/04 0701 - 12/05 0700 In: 50 [P.O.:50] Out: 1650 [Urine:1650]  Intake/Output this shift: No intake/output data recorded.  Physical Exam:  General: sleeping CV: No cyanosis Lungs: equal chest rise    Lab Results: Recent Labs    10/11/23 0606 10/12/23 0702  HGB 8.9* 8.4*  HCT 25.5* 25.1*   BMET Recent Labs    10/11/23 0606 10/12/23 0702  NA 124* 129*  K 4.1 4.2  CL 92* 97*  CO2 21* 22  GLUCOSE 124* 115*  BUN 40* 67*  CREATININE 1.63* 1.49*  CALCIUM 9.4 9.9     Studies/Results: No results found.    LOS: 9 days   Elmon Kirschner, NP [facility] Pager: [phone]  10/12/2023, 9:13 AM

## 2023-10-12 NOTE — Progress Notes (Signed)
Mobility Specialist - Progress Note   10/12/23 1104  Mobility  Activity Transferred to/from [facility]  Level of Assistance +2 (takes two people)  Musician  Activity Response Tolerated well  Mobility Referral Yes  Mobility visit 1 Mobility  Mobility Specialist Start Time (ACUTE ONLY) 1055  Mobility Specialist Stop Time (ACUTE ONLY) 1105  Mobility Specialist Time Calculation (min) (ACUTE ONLY) 10 min   Pt received on BSC. Assisted nursing with getting patient back into bed from [facility]. Pt was maxA during transfer. No complaints during session. Pt to bed after session with all needs met. Bed alarm on.    [facility]

## 2023-10-12 NOTE — Plan of Care (Signed)

## 2023-10-12 NOTE — Progress Notes (Signed)
Progress Note    SHARANYA ORIANS   ZOX:[id number]  DOB: 1928-05-26  DOA: 10/02/2023     9 PCP: Jarrett Soho, PA-C  Initial CC: Hallucinations  Hospital Course: Ms. Gren is a 89 yo female with PMH recurrent UTIs, hypertension, DM II, HLD who presented up on recommendation from Center well after outpatient urine culture came back positive. Patient's daughter also provided collateral information stating that the patient was continuing to become more confused and hallucinating prior to admission.  They had obtained urine specimen on 09/26/2023 and had not yet started/been prescribed antibiotics prior to hospitalization. UA on admission showed large LE, negative nitrite, greater than 50 WBC.  She was started on Rocephin and admitted for further workup and monitoring.  Urine culture grew Klebsiella and Aerococcus. She completed 10-day course of Rocephin during hospitalization.  Interval History:  Daughter present bedside this morning.  Patient still having difficulty sleeping at night.  She was requesting something further to help her rest.  Patient otherwise doing about the same as yesterday, slight improvement in mentation and alertness. Labs reviewed with daughter which showed some improvement as well.  Assessment and Plan: * Acute cystitis-resolved as of 10/11/2023 - Main symptom appears to be hallucinations and altered mentation on admission.  Original urine specimen obtained 09/26/2023 outpatient and sent to [facility] for testing; appears patient told culture was positive on 11/25 and to go to ER - obtained fax report and is placed in chart; unfortunately only says "mixed urogenital flora, > 100k colonies). No other micro data sent -Urine culture inpatient grew Klebsiella and Aerococcus.  Patient completed 10-day course of Rocephin during hospitalization  Hyponatremia - Level has been fluctuating during hospitalization - Nephrology has also been consulted, appreciate  assistance - Some concern for underlying component of SIADH - Continue sodium chloride tablets; to be discontinued at discharge - Urea added 10/11/2023 per nephrology and discontinued on 10/12/2023 - continue trending   Acute metabolic encephalopathy - Multifactorial at this point from mostly hospital delirium given prolonged hospitalization but also some contribution from electrolyte derangements; lower suspicion from untreated UTI -Continue delirium precautions and reorienting patient is able -Ideally would like to get patient home back to normal environment -Okay to continue Seroquel and trazodone at night for now -Increasing trazodone and seroquel dose to help with sleep at night as she has not been sleeping well  Acute urinary retention - Evaluated by urology as well, appreciate assistance - Still having ongoing incomplete bladder emptying with high residuals but she is however voiding -Continue with bladder scans; if creatinine does further elevate, will rediscuss Foley need with urology, or if PVRs continue to further uptrend  Chronic kidney disease, stage 3b (HCC) - patient has history of CKD3b. Baseline creat ~ 1.3 - 1.5, eGFR~ 35   Euthyroid sick syndrome - TSH 6.354 - FT4 considered normal 1.15; low free T3 as expected given rapid physiologic metabolism -No further workup indicated - Have recommended daughter to have repeat TSH and free T4 outpatient once patient is back to baseline from acute illness, tentatively in approximately 2 months  Physical deconditioning - Worsening frailty during hospitalization and patient now 2+ assist.  Daughter still declining SNF and plans on bringing patient home with ongoing home health  Essential hypertension - ARB on hold - Lasix resumed 12/5 per nephrology  - Continue amlodipine  Hyperlipidemia - Continue Zocor   Old records reviewed in assessment of this patient  Antimicrobials: Rocephin 10/02/2023 >> 10/11/23  DVT  prophylaxis:  enoxaparin (  LOVENOX) injection 30 mg Start: 10/09/23 2200 SCDs Start: 10/02/23 1650   Code Status:   Code Status: Full Code  Mobility Assessment (Last 72 Hours)     Mobility Assessment     Row Name 10/12/23 0845 10/11/23 1945 10/11/23 1100 10/11/23 1046 10/11/23 0800   Does patient have an order for bedrest or is patient medically unstable No - Continue assessment No - Continue assessment -- -- No - Continue assessment   What is the highest level of mobility based on the progressive mobility assessment? Level 4 (Walks with assist in room) - Balance while marching in place and cannot step forward and back - Complete Level 4 (Walks with assist in room) - Balance while marching in place and cannot step forward and back - Complete Level 4 (Walks with assist in room) - Balance while marching in place and cannot step forward and back - Complete Level 2 (Chairfast) - Balance while sitting on edge of bed and cannot stand Level 4 (Walks with assist in room) - Balance while marching in place and cannot step forward and back - Complete   Is the above level different from baseline mobility prior to current illness? Yes - Recommend PT order Yes - Recommend PT order -- -- Yes - Recommend PT order    Row Name 10/10/23 1956 10/10/23 1600 10/10/23 1300 10/09/23 1955     Does patient have an order for bedrest or is patient medically unstable No - Continue assessment -- No - Continue assessment No - Continue assessment    What is the highest level of mobility based on the progressive mobility assessment? Level 2 (Chairfast) - Balance while sitting on edge of bed and cannot stand Level 5 (Walks with assist in room/hall) - Balance while stepping forward/back and can walk in room with assist - Complete Level 2 (Chairfast) - Balance while sitting on edge of bed and cannot stand Level 2 (Chairfast) - Balance while sitting on edge of bed and cannot stand    Is the above level different from baseline  mobility prior to current illness? Yes - Recommend PT order -- -- Yes - Recommend PT order             Barriers to discharge: none Disposition Plan:  Home Status is: Inpt  Objective: Blood pressure (!) 116/58, pulse 64, temperature 97.6 F (36.4 C), temperature source Oral, resp. rate 18, height 5\' 6"  (1.676 m), weight 85.7 kg, SpO2 98%.  Examination:  Physical Exam Constitutional:      Appearance: Normal appearance.     Comments: Frail and deconditioned appearing with obvious confusion  HENT:     Head: Normocephalic and atraumatic.     Mouth/Throat:     Mouth: Mucous membranes are moist.  Eyes:     Extraocular Movements: Extraocular movements intact.  Cardiovascular:     Rate and Rhythm: Normal rate and regular rhythm.  Pulmonary:     Effort: Pulmonary effort is normal. No respiratory distress.     Breath sounds: Normal breath sounds. No wheezing.  Abdominal:     General: Bowel sounds are normal. There is no distension.     Palpations: Abdomen is soft.     Tenderness: There is no abdominal tenderness.  Musculoskeletal:        General: Swelling (3+ B/L LE) present. Normal range of motion.     Cervical back: Normal range of motion and neck supple.  Skin:    General: Skin is warm and dry.  Neurological:  Mental Status: She is disoriented.     Motor: Weakness (generalized but worse in LE) present.      Consultants:    Procedures:    Data Reviewed: Results for orders placed or performed during the hospital encounter of 10/02/23 (from the past 24 hour(s))  Comprehensive metabolic panel     Status: Abnormal   Collection Time: 10/12/23  7:02 AM  Result Value Ref Range   Sodium 129 (L) 135 - 145 mmol/L   Potassium 4.2 3.5 - 5.1 mmol/L   Chloride 97 (L) 98 - 111 mmol/L   CO2 22 22 - 32 mmol/L   Glucose, Bld 115 (H) 70 - 99 mg/dL   BUN 67 (H) 8 - 23 mg/dL   Creatinine, Ser 4.09 (H) 0.44 - 1.00 mg/dL   Calcium 9.9 8.9 - 81.1 mg/dL   Total Protein 6.0 (L) 6.5 -  8.1 g/dL   Albumin 2.8 (L) 3.5 - 5.0 g/dL   AST 914 (H) 15 - 41 U/L   ALT 331 (H) 0 - 44 U/L   Alkaline Phosphatase 264 (H) 38 - 126 U/L   Total Bilirubin 0.2 <1.2 mg/dL   GFR, Estimated 33 (L) >60 mL/min   Anion gap 10 5 - 15  CBC     Status: Abnormal   Collection Time: 10/12/23  7:02 AM  Result Value Ref Range   WBC 6.3 4.0 - 10.5 K/uL   RBC 2.94 (L) 3.87 - 5.11 MIL/uL   Hemoglobin 8.4 (L) 12.0 - 15.0 g/dL   HCT 78.2 (L) 95.6 - 21.3 %   MCV 85.4 80.0 - 100.0 fL   MCH 28.6 26.0 - 34.0 pg   MCHC 33.5 30.0 - 36.0 g/dL   RDW 08.6 57.8 - 46.9 %   Platelets 202 150 - 400 K/uL   nRBC 0.0 0.0 - 0.2 %  Magnesium     Status: Abnormal   Collection Time: 10/12/23  7:02 AM  Result Value Ref Range   Magnesium 2.8 (H) 1.7 - 2.4 mg/dL    I have reviewed pertinent nursing notes, vitals, labs, and images as necessary. I have ordered labwork to follow up on as indicated.  I have reviewed the last notes from staff over past 24 hours. I have discussed patient's care plan and test results with nursing staff, CM/SW, and other staff as appropriate.  Time spent: Greater than 50% of the 55 minute visit was spent in counseling/coordination of care for the patient as laid out in the A&P.   LOS: 9 days   Lewie Chamber, MD [facility] 10/12/2023, 3:19 PM

## 2023-10-12 NOTE — Progress Notes (Signed)
Mobility Specialist - Progress Note   10/12/23 1044  Mobility  Activity Transferred to/from [facility]  Level of Assistance +2 (takes two people)  Musician  Activity Response Tolerated well  Mobility Referral Yes  $Mobility charge 1 Mobility  Mobility Specialist Start Time (ACUTE ONLY) 1026  Mobility Specialist Stop Time (ACUTE ONLY) 1038  Mobility Specialist Time Calculation (min) (ACUTE ONLY) 12 min   Pt received in bed requesting assistance to [facility]. RN assisted w/ transfer. Pt was maxA from supine>sitting & STS. Pt required +2 assistance for transfer. No complaints during session. Pt to [facility] with all needs met. Family in room.   [facility]

## 2023-10-13 DIAGNOSIS — T68XXXA Hypothermia, initial encounter: Secondary | ICD-10-CM | POA: Diagnosis not present

## 2023-10-13 DIAGNOSIS — E871 Hypo-osmolality and hyponatremia: Secondary | ICD-10-CM | POA: Diagnosis not present

## 2023-10-13 DIAGNOSIS — N3000 Acute cystitis without hematuria: Secondary | ICD-10-CM | POA: Diagnosis not present

## 2023-10-13 DIAGNOSIS — G9341 Metabolic encephalopathy: Secondary | ICD-10-CM | POA: Diagnosis not present

## 2023-10-13 LAB — COMPREHENSIVE METABOLIC PANEL
ALT: 295 U/L — ABNORMAL HIGH (ref 0–44)
AST: 203 U/L — ABNORMAL HIGH (ref 15–41)
Albumin: 2.8 g/dL — ABNORMAL LOW (ref 3.5–5.0)
Alkaline Phosphatase: 271 U/L — ABNORMAL HIGH (ref 38–126)
Anion gap: 10 (ref 5–15)
BUN: 58 mg/dL — ABNORMAL HIGH (ref 8–23)
CO2: 23 mmol/L (ref 22–32)
Calcium: 9.9 mg/dL (ref 8.9–10.3)
Chloride: 94 mmol/L — ABNORMAL LOW (ref 98–111)
Creatinine, Ser: 1.41 mg/dL — ABNORMAL HIGH (ref 0.44–1.00)
GFR, Estimated: 35 mL/min — ABNORMAL LOW (ref >60)
Glucose, Bld: 99 mg/dL (ref 70–99)
Potassium: 4.7 mmol/L (ref 3.5–5.1)
Sodium: 127 mmol/L — ABNORMAL LOW (ref 135–145)
Total Bilirubin: 0.4 mg/dL (ref <1.2)
Total Protein: 6.3 g/dL — ABNORMAL LOW (ref 6.5–8.1)

## 2023-10-13 LAB — CBC WITH DIFFERENTIAL/PLATELET
Abs Immature Granulocytes: 0.04 10*3/uL (ref 0.00–0.07)
Basophils Absolute: 0 10*3/uL (ref 0.0–0.1)
Basophils Relative: 0 %
Eosinophils Absolute: 0.2 10*3/uL (ref 0.0–0.5)
Eosinophils Relative: 2 %
HCT: 27.3 % — ABNORMAL LOW (ref 36.0–46.0)
Hemoglobin: 9.3 g/dL — ABNORMAL LOW (ref 12.0–15.0)
Immature Granulocytes: 1 %
Lymphocytes Relative: 24 %
Lymphs Abs: 1.8 10*3/uL (ref 0.7–4.0)
MCH: 29.2 pg (ref 26.0–34.0)
MCHC: 34.1 g/dL (ref 30.0–36.0)
MCV: 85.8 fL (ref 80.0–100.0)
Monocytes Absolute: 0.6 10*3/uL (ref 0.1–1.0)
Monocytes Relative: 8 %
Neutro Abs: 4.8 10*3/uL (ref 1.7–7.7)
Neutrophils Relative %: 65 %
Platelets: 201 10*3/uL (ref 150–400)
RBC: 3.18 MIL/uL — ABNORMAL LOW (ref 3.87–5.11)
RDW: 15 % (ref 11.5–15.5)
WBC: 7.4 10*3/uL (ref 4.0–10.5)
nRBC: 0.3 % — ABNORMAL HIGH (ref 0.0–0.2)

## 2023-10-13 LAB — URINALYSIS, COMPLETE (UACMP) WITH MICROSCOPIC
Bacteria, UA: NONE SEEN
Bilirubin Urine: NEGATIVE
Glucose, UA: NEGATIVE mg/dL
Hgb urine dipstick: NEGATIVE
Ketones, ur: NEGATIVE mg/dL
Leukocytes,Ua: NEGATIVE
Nitrite: NEGATIVE
Protein, ur: NEGATIVE mg/dL
Specific Gravity, Urine: 1.006 (ref 1.005–1.030)
pH: 5 (ref 5.0–8.0)

## 2023-10-13 LAB — MRSA NEXT GEN BY PCR, NASAL: MRSA by PCR Next Gen: NOT DETECTED

## 2023-10-13 LAB — BLOOD GAS, VENOUS
Acid-Base Excess: 2.4 mmol/L — ABNORMAL HIGH (ref 0.0–2.0)
Bicarbonate: 27.3 mmol/L (ref 20.0–28.0)
O2 Saturation: 88.9 %
Patient temperature: 37
pCO2, Ven: 43 mm[Hg] — ABNORMAL LOW (ref 44–60)
pH, Ven: 7.41 (ref 7.25–7.43)
pO2, Ven: 55 mm[Hg] — ABNORMAL HIGH (ref 32–45)

## 2023-10-13 LAB — MAGNESIUM: Magnesium: 2.7 mg/dL — ABNORMAL HIGH (ref 1.7–2.4)

## 2023-10-13 LAB — GLUCOSE, CAPILLARY: Glucose-Capillary: 82 mg/dL (ref 70–99)

## 2023-10-13 LAB — AMMONIA: Ammonia: 21 umol/L (ref 9–35)

## 2023-10-13 MED ORDER — TRAZODONE HCL 50 MG PO TABS: 50.0000 mg | ORAL_TABLET | Freq: Every day | ORAL | Status: DC

## 2023-10-13 MED ORDER — CHLORHEXIDINE GLUCONATE CLOTH 2 % EX PADS
6.0000 | MEDICATED_PAD | Freq: Every day | CUTANEOUS | Status: DC
  Administered 2023-10-13 – 2023-10-14 (×2): 6 via TOPICAL

## 2023-10-13 NOTE — Assessment & Plan Note (Signed)
-   Patient developed hypothermia on 12/6 in the afternoon with rectal temp 93.8.  This also was when she was found to have significantly elevated urinary retention on bladder scan with urology present (greater than 450 cc).  Due to her level of sedation, not unsurprising that she is not able to void; suspect this was inevitable given some of her overall functional decline - tx to SDU for bair hugger; temperature improved overnight and now normalized on 10/14/2023.  No further need for Bair hugger -Urinalysis also negative for signs of infection and reassuring

## 2023-10-13 NOTE — Progress Notes (Signed)
Progress Note    Joanne Patterson   ZOX:[id number]  DOB: [birth year]  DOA: 10/02/2023     10 PCP: Jarrett Soho, PA-C  Initial CC: Hallucinations  Hospital Course: Ms. Knee is a 89 yo female with PMH recurrent UTIs, hypertension, DM II, HLD who presented up on recommendation from Center well after outpatient urine culture came back positive. Patient's daughter also provided collateral information stating that the patient was continuing to become more confused and hallucinating prior to admission.  They had obtained urine specimen on 09/26/2023 and had not yet started/been prescribed antibiotics prior to hospitalization. UA on admission showed large LE, negative nitrite, greater than 50 WBC.  She was started on Rocephin and admitted for further workup and monitoring.  Urine culture grew Klebsiella and Aerococcus. She completed 10-day course of Rocephin during hospitalization.  Interval History:  When seen this morning she was sleeping heavily but aroused easily and spoke some.  Daughter and granddaughter were present bedside as well. Later in the afternoon she developed hypothermia and ongoing heavy somnolence.  Some etiology considered due to lingering trazodone and Seroquel from last night at increased doses. Bladder scan also showing elevated volume and urology placing Foley catheter.  Patient being transferred to stepdown for hypothermia management.  Assessment and Plan: * Acute cystitis-resolved as of 10/11/2023 - Main symptom appears to be hallucinations and altered mentation on admission.  Original urine specimen obtained 09/26/2023 outpatient and sent to [facility] for testing; appears patient told culture was positive on 11/25 and to go to ER - obtained fax report and is placed in chart; unfortunately only says "mixed urogenital flora, > 100k colonies). No other micro data sent -Urine culture inpatient grew Klebsiella and Aerococcus.  Patient completed 10-day course of  Rocephin during hospitalization  Hypothermia - Patient developed hypothermia on 12/6 in the afternoon with rectal temp 93.8.  This also was when she was found to have significantly elevated urinary retention on bladder scan with urology present (greater than 450 cc).  Due to her level of sedation, not unsurprising that she is not able to void; suspect this was inevitable given some of her overall functional decline - tx to SDU for bair hugger - obtain UA from foley -Hypothermia concerning for possible recurrent infection.  Workup commenced  Acute metabolic encephalopathy - Multifactorial at this point from mostly hospital delirium given prolonged hospitalization but also some contribution from electrolyte derangements; lower suspicion from untreated UTI -Continue delirium precautions and reorienting patient is able -Ideally would like to get patient home back to normal environment -Okay to continue Seroquel and trazodone at night for now -Increased trazodone and seroquel dose on 12/5 and patient did sleep better but remained somnolent most of 12/6; will decrease doses if she awakens enough to take meds again, but will need to allow time for wash out - also given excessive lethargy on 12/6, will also check NH3 and VBG for thoroughness   Hyponatremia - Level has been fluctuating during hospitalization - Nephrology has also been consulted, appreciate assistance - Some concern for underlying component of SIADH - Continue sodium chloride tablets; to be discontinued at discharge - Urea added 10/11/2023 per nephrology and discontinued on 10/12/2023 - continue trending   Acute urinary retention - Evaluated by urology as well, appreciate assistance - Still having ongoing incomplete bladder emptying with high residuals but she is however voiding - due to hypothermia and increased bladder scanning again on 12/6, decision made for foley placement when seen by urology; agree with  placement - will check  UA off foley specimen  Chronic kidney disease, stage 3b (HCC) - patient has history of CKD3b. Baseline creat ~ 1.3 - 1.5, eGFR~ 35   Euthyroid sick syndrome - TSH 6.354 - FT4 considered normal 1.15; low free T3 as expected given rapid physiologic metabolism -No further workup indicated - Have recommended daughter to have repeat TSH and free T4 outpatient once patient is back to baseline from acute illness, tentatively in approximately 2 months  Physical deconditioning - Worsening frailty during hospitalization and patient now 2+ assist.  Daughter still declining SNF and plans on bringing patient home with ongoing home health  Essential hypertension - ARB on hold - Lasix resumed 12/5 per nephrology  - Continue amlodipine  Hyperlipidemia - hold Zocor   Old records reviewed in assessment of this patient  Antimicrobials: Rocephin 10/02/2023 >> 10/11/23  DVT prophylaxis:  enoxaparin (LOVENOX) injection 30 mg Start: 10/09/23 2200 SCDs Start: 10/02/23 1650   Code Status:   Code Status: Full Code  Mobility Assessment (Last 72 Hours)     Mobility Assessment     Row Name 10/13/23 1500 10/13/23 0930 10/12/23 2016 10/12/23 0845 10/11/23 1945   Does patient have an order for bedrest or is patient medically unstable -- No - Continue assessment No - Continue assessment No - Continue assessment No - Continue assessment   What is the highest level of mobility based on the progressive mobility assessment? Level 1 (Bedfast) - Unable to balance while sitting on edge of bed Level 2 (Chairfast) - Balance while sitting on edge of bed and cannot stand Level 2 (Chairfast) - Balance while sitting on edge of bed and cannot stand Level 4 (Walks with assist in room) - Balance while marching in place and cannot step forward and back - Complete Level 4 (Walks with assist in room) - Balance while marching in place and cannot step forward and back - Complete   Is the above level different from baseline  mobility prior to current illness? -- Yes - Recommend PT order Yes - Recommend PT order Yes - Recommend PT order Yes - Recommend PT order    Row Name 10/11/23 1100 10/11/23 1046 10/11/23 0800 10/10/23 1956     Does patient have an order for bedrest or is patient medically unstable -- -- No - Continue assessment No - Continue assessment    What is the highest level of mobility based on the progressive mobility assessment? Level 4 (Walks with assist in room) - Balance while marching in place and cannot step forward and back - Complete Level 2 (Chairfast) - Balance while sitting on edge of bed and cannot stand Level 4 (Walks with assist in room) - Balance while marching in place and cannot step forward and back - Complete Level 2 (Chairfast) - Balance while sitting on edge of bed and cannot stand    Is the above level different from baseline mobility prior to current illness? -- -- Yes - Recommend PT order Yes - Recommend PT order             Barriers to discharge: none Disposition Plan:  Home Status is: Inpt  Objective: Blood pressure (!) 119/45, pulse (!) 58, temperature (!) 93.8 F (34.3 C), temperature source Rectal, resp. rate 16, height 5\' 6"  (1.676 m), weight 85.7 kg, SpO2 100%.  Examination:  Physical Exam Constitutional:      Appearance: Normal appearance.     Comments: Sleeping heavily when seen this morning but aroused  easily and went back to sleep  HENT:     Head: Normocephalic and atraumatic.     Mouth/Throat:     Mouth: Mucous membranes are moist.  Eyes:     Extraocular Movements: Extraocular movements intact.  Cardiovascular:     Rate and Rhythm: Normal rate and regular rhythm.  Pulmonary:     Effort: Pulmonary effort is normal. No respiratory distress.     Breath sounds: Normal breath sounds. No wheezing.  Abdominal:     General: Bowel sounds are normal. There is no distension.     Palpations: Abdomen is soft.     Tenderness: There is no abdominal tenderness.   Musculoskeletal:        General: Swelling (3+ B/L LE) present. Normal range of motion.     Cervical back: Normal range of motion and neck supple.  Skin:    General: Skin is warm and dry.  Neurological:     Mental Status: She is disoriented.     Motor: Weakness (generalized but worse in LE) present.      Consultants:  Nephrology-signed off Urology  Procedures:    Data Reviewed: Results for orders placed or performed during the hospital encounter of 10/02/23 (from the past 24 hour(s))  Magnesium     Status: Abnormal   Collection Time: 10/13/23  6:16 AM  Result Value Ref Range   Magnesium 2.7 (H) 1.7 - 2.4 mg/dL  CBC with Differential/Platelet     Status: Abnormal   Collection Time: 10/13/23  6:16 AM  Result Value Ref Range   WBC 7.4 4.0 - 10.5 K/uL   RBC 3.18 (L) 3.87 - 5.11 MIL/uL   Hemoglobin 9.3 (L) 12.0 - 15.0 g/dL   HCT 46.9 (L) 62.9 - 52.8 %   MCV 85.8 80.0 - 100.0 fL   MCH 29.2 26.0 - 34.0 pg   MCHC 34.1 30.0 - 36.0 g/dL   RDW 41.3 24.4 - 01.0 %   Platelets 201 150 - 400 K/uL   nRBC 0.3 (H) 0.0 - 0.2 %   Neutrophils Relative % 65 %   Neutro Abs 4.8 1.7 - 7.7 K/uL   Lymphocytes Relative 24 %   Lymphs Abs 1.8 0.7 - 4.0 K/uL   Monocytes Relative 8 %   Monocytes Absolute 0.6 0.1 - 1.0 K/uL   Eosinophils Relative 2 %   Eosinophils Absolute 0.2 0.0 - 0.5 K/uL   Basophils Relative 0 %   Basophils Absolute 0.0 0.0 - 0.1 K/uL   Immature Granulocytes 1 %   Abs Immature Granulocytes 0.04 0.00 - 0.07 K/uL  Comprehensive metabolic panel     Status: Abnormal   Collection Time: 10/13/23  6:16 AM  Result Value Ref Range   Sodium 127 (L) 135 - 145 mmol/L   Potassium 4.7 3.5 - 5.1 mmol/L   Chloride 94 (L) 98 - 111 mmol/L   CO2 23 22 - 32 mmol/L   Glucose, Bld 99 70 - 99 mg/dL   BUN 58 (H) 8 - 23 mg/dL   Creatinine, Ser 2.72 (H) 0.44 - 1.00 mg/dL   Calcium 9.9 8.9 - 53.6 mg/dL   Total Protein 6.3 (L) 6.5 - 8.1 g/dL   Albumin 2.8 (L) 3.5 - 5.0 g/dL   AST 644 (H) 15 -  41 U/L   ALT 295 (H) 0 - 44 U/L   Alkaline Phosphatase 271 (H) 38 - 126 U/L   Total Bilirubin 0.4 <1.2 mg/dL   GFR, Estimated 35 (L) >60 mL/min  Anion gap 10 5 - 15  Glucose, capillary     Status: None   Collection Time: 10/13/23  4:13 PM  Result Value Ref Range   Glucose-Capillary 82 70 - 99 mg/dL    I have reviewed pertinent nursing notes, vitals, labs, and images as necessary. I have ordered labwork to follow up on as indicated.  I have reviewed the last notes from staff over past 24 hours. I have discussed patient's care plan and test results with nursing staff, CM/SW, and other staff as appropriate.  Time spent: Greater than 50% of the 55 minute visit was spent in counseling/coordination of care for the patient as laid out in the A&P.   LOS: 10 days   Lewie Chamber, MD [facility] 10/13/2023, 4:32 PM

## 2023-10-13 NOTE — Progress Notes (Signed)
Patient is alert oriented to self only. Pt is is not agreeable to plan of care and has become combative with staff. Unable to complete tasks for daily living. She is resting, respirations are equal and non labored. Daughter remains at bedside.

## 2023-10-13 NOTE — Progress Notes (Signed)
     Subjective: Long conversation with the Byes family (daughter and granddaughter). Reviewed labs and plan, Nephrology was present. All questions were answered to their satisfaction. Pt experiencing hospital delirium. Reviewed steps to take in an effort to align circadian rhythm.   Objective: Vital signs in last 24 hours: Temp:  [93.8 F (34.3 C)-98.2 F (36.8 C)] 93.8 F (34.3 C) (12/06 1519) Pulse Rate:  [58-72] 58 (12/06 1519) Resp:  [16] 16 (12/06 1519) BP: (119-158)/(45-89) 119/45 (12/06 1519) SpO2:  [98 %-100 %] 100 % (12/06 1519)  Assessment/Plan: #mixed incontinence Unable to get pt to bedside commode w/ 2 person assist. Essentially dead weight. Still retaining with sudden drop to 93.8 rectal temp.  Foley catheter placed. Transferring to stepdown ICU for monitoring and Bair hugger.  Scr trending with her baseline, no hydronephrosis, hyponatremia improved.  Family to reschedule for pelvic exam and diagnostic cystoscopy.  Resume daily suppressive Trimethoprim on discharge Ok to try flomax again, limited benefit for women with retention  #constipation Family reports frequent periods on constipation. Advised to use OTC daily Miralax, titrated to one BM per day. This will reduce the chance of bladder outlet obstruction 2/2 pelvic stool burden.   #hospital delirium Blinds opens at beginning of shift, lights out after dinner.  May benefit from melatonin Frequent reorientation  Intake/Output from previous day: 12/05 0701 - 12/06 0700 In: -  Out: 100 [Urine:100]  Intake/Output this shift: Total I/O In: -  Out: 350 [Urine:350]  Physical Exam:  General: sleeping CV: No cyanosis Lungs: equal chest rise GU: foley in place draining clear yellow urine    Lab Results: Recent Labs    10/11/23 0606 10/12/23 0702 10/13/23 0616  HGB 8.9* 8.4* 9.3*  HCT 25.5* 25.1* 27.3*   BMET Recent Labs    10/12/23 0702 10/13/23 0616  NA 129* 127*  K 4.2 4.7  CL 97* 94*   CO2 22 23  GLUCOSE 115* 99  BUN 67* 58*  CREATININE 1.49* 1.41*  CALCIUM 9.9 9.9     Studies/Results: No results found.    LOS: 10 days   Elmon Kirschner, NP [facility] Pager: [phone]  10/13/2023, 3:45 PM

## 2023-10-13 NOTE — Plan of Care (Signed)

## 2023-10-13 NOTE — Progress Notes (Signed)
The patient is responsive to pain, respirations are equal and non labored. Provider contacted to assist and now at bedside. Bladder scan > 450 cc.   10/13/23 1519  Vitals  Temp (!) 93.8 F (34.3 C)  Temp Source Rectal  BP (!) 119/45  MAP (mmHg) 67  BP Location Right Arm  BP Method Automatic  Patient Position (if appropriate) Lying  Pulse Rate (!) 58  Pulse Rate Source Monitor  Resp 16  Oxygen Therapy  SpO2 100 %  O2 Device Room Air

## 2023-10-13 NOTE — Progress Notes (Signed)
Physical Therapy Treatment Patient Details Name: Joanne Patterson MRN: [id number] DOB: 1928/10/18 Today's Date: 10/13/2023   History of Present Illness Pt is a 89 y.o. female admitted with UTI. PMH significant for HTN, type II DM, hyperlipidemia, recurrent UTI and hyponatremia, L heel wound, R ankle sprain 08/2023, falls    PT Comments   Pt admitted with above diagnosis.  Pt currently with functional limitations due to the deficits listed below (see PT Problem List). PT and rehab tech arrived for intervention today. Daughter present and indicated that "her mom is not herself" today and that she was ok really early this am and states medication changes impacting pts presentation and behavior. PT made several attempts with multimodal cues to arouse pt to engage with therapeutic activity.  Pt exhibiting absent verbal communication, self limiting eyes open and making attempts to strike therapist and rehab tech with clenched fists. PT unable to redirect perceived behaviors at this time. PT provided family ed on use of total lift if pt is to get OOB and later made nursing staff aware in change in recommendation for transfer status and pt behaviors. Patient will benefit from continued inpatient follow up therapy, <3 hours/day. Daughter continues to state will take pt home with [facility] services. DME needs TBD if pt transitions home vs SNF setting.  Pt will benefit from acute skilled PT to increase their independence and safety with mobility to allow discharge.      If plan is discharge home, recommend the following: Assistance with cooking/housework;Assist for transportation;Help with stairs or ramp for entrance;A lot of help with walking and/or transfers;A lot of help with bathing/dressing/bathroom   Can travel by private vehicle     No  Equipment Recommendations  Rolling walker (2 wheels)    Recommendations for Other Services OT consult     Precautions / Restrictions Precautions Precautions:  Fall Precaution Comments: poor cognition Restrictions Weight Bearing Restrictions: No     Mobility  Bed Mobility                    Transfers                        Ambulation/Gait                   Stairs             Wheelchair Mobility     Tilt Bed    Modified Rankin (Stroke Patients Only)       Balance                                            Cognition Arousal: Lethargic, Suspect due to medications Behavior During Therapy: Flat affect Overall Cognitive Status: Impaired/Different from baseline Area of Impairment: Following commands, Problem solving, Safety/judgement                 Orientation Level: Place, Time, Situation, Disoriented to   Memory: Decreased short-term memory Following Commands: Follows one step commands inconsistently Safety/Judgement: Decreased awareness of safety, Decreased awareness of deficits   Problem Solving: Difficulty sequencing, Decreased initiation, Requires verbal cues, Requires tactile cues, Slow processing General Comments: pt exhbited percived behaviors during attempts to have pt participate with theapy today        Exercises      General Comments  Pertinent Vitals/Pain      Home Living                          Prior Function            PT Goals (current goals can now be found in the care plan section) Acute Rehab PT Goals Patient Stated Goal: per daughter, for pt to get better, stronger and return home PT Goal Formulation: With family Time For Goal Achievement: 10/17/23 Potential to Achieve Goals: Fair Progress towards PT goals: Not progressing toward goals - comment    Frequency    Min 1X/week      PT Plan      Co-evaluation              AM-PAC PT "6 Clicks" Mobility   Outcome Measure  Help needed turning from your back to your side while in a flat bed without using bedrails?: A Lot Help needed moving from lying  on your back to sitting on the side of a flat bed without using bedrails?: A Lot Help needed moving to and from a bed to a chair (including a wheelchair)?: A Lot Help needed standing up from a chair using your arms (e.g., wheelchair or bedside chair)?: A Lot Help needed to walk in hospital room?: A Lot Help needed climbing 3-5 steps with a railing? : Total 6 Click Score: 11    End of Session   Activity Tolerance: Treatment limited secondary to agitation Patient left: with call bell/phone within reach;with family/visitor present;in bed Nurse Communication: Mobility status;Need for lift equipment PT Visit Diagnosis: Muscle weakness (generalized) (M62.81);Unsteadiness on feet (R26.81);History of falling (Z91.81);Pain;Difficulty in walking, not elsewhere classified (R26.2)     Time: 1201-1210 PT Time Calculation (min) (ACUTE ONLY): 9 min  Charges:    $Therapeutic Activity: 8-22 mins PT General Charges $$ ACUTE PT VISIT: 1 Visit                     Joanne Patterson, PT Acute Rehab    Joanne Patterson 10/13/2023, 3:09 PM

## 2023-10-13 NOTE — Plan of Care (Signed)
Seen bedside this afternoon after rectal temp dropped. Most of her sedation is because of increase in trazodone and seroquel last night as she has had extreme difficulty sleeping which is contributing to significant delirium. As meds wash out she should continue to arouse/awaken more.  Due to her level of deconditioning and delirium she's been having more difficulty with voiding and having elevated PVRs at times but has been able to intermittently void enough to avoid having foley placed. Urology has been monitoring as well.   This afternoon, bladder scans have again increased and now with drop in rectal temp to 93.8, she may be developing recurrent infection due to retention.  No issues regarding aspiration risk and labs have routinely monitored; she often doesn't have leukocytosis per family when developing infection.   Plan: - transfer to SDU for bair hugger - urology planning to place foley as now more deconditioned and having harder time evacuating bladder - obtain urine specimen from foley - check ammonia level - O2 adequate, check VBG given sedation however - may warrant re-initiation of abx pending UA   Lewie Chamber, MD [facility] 10/13/2023, 3:46 PM

## 2023-10-14 DIAGNOSIS — N3000 Acute cystitis without hematuria: Secondary | ICD-10-CM | POA: Diagnosis not present

## 2023-10-14 DIAGNOSIS — E871 Hypo-osmolality and hyponatremia: Secondary | ICD-10-CM | POA: Diagnosis not present

## 2023-10-14 DIAGNOSIS — G9341 Metabolic encephalopathy: Secondary | ICD-10-CM | POA: Diagnosis not present

## 2023-10-14 DIAGNOSIS — T68XXXA Hypothermia, initial encounter: Secondary | ICD-10-CM | POA: Diagnosis not present

## 2023-10-14 LAB — COMPREHENSIVE METABOLIC PANEL
ALT: 219 U/L — ABNORMAL HIGH (ref 0–44)
AST: 140 U/L — ABNORMAL HIGH (ref 15–41)
Albumin: 2.6 g/dL — ABNORMAL LOW (ref 3.5–5.0)
Alkaline Phosphatase: 205 U/L — ABNORMAL HIGH (ref 38–126)
Anion gap: 6 (ref 5–15)
BUN: 51 mg/dL — ABNORMAL HIGH (ref 8–23)
CO2: 24 mmol/L (ref 22–32)
Calcium: 9.1 mg/dL (ref 8.9–10.3)
Chloride: 97 mmol/L — ABNORMAL LOW (ref 98–111)
Creatinine, Ser: 1.24 mg/dL — ABNORMAL HIGH (ref 0.44–1.00)
GFR, Estimated: 41 mL/min — ABNORMAL LOW (ref >60)
Glucose, Bld: 70 mg/dL (ref 70–99)
Potassium: 4.3 mmol/L (ref 3.5–5.1)
Sodium: 127 mmol/L — ABNORMAL LOW (ref 135–145)
Total Bilirubin: 0.4 mg/dL (ref <1.2)
Total Protein: 5.6 g/dL — ABNORMAL LOW (ref 6.5–8.1)

## 2023-10-14 LAB — CBC WITH DIFFERENTIAL/PLATELET
Abs Immature Granulocytes: 0.04 10*3/uL (ref 0.00–0.07)
Basophils Absolute: 0 10*3/uL (ref 0.0–0.1)
Basophils Relative: 0 %
Eosinophils Absolute: 0.1 10*3/uL (ref 0.0–0.5)
Eosinophils Relative: 2 %
HCT: 22.9 % — ABNORMAL LOW (ref 36.0–46.0)
Hemoglobin: 7.7 g/dL — ABNORMAL LOW (ref 12.0–15.0)
Immature Granulocytes: 1 %
Lymphocytes Relative: 23 %
Lymphs Abs: 1.2 10*3/uL (ref 0.7–4.0)
MCH: 28.9 pg (ref 26.0–34.0)
MCHC: 33.6 g/dL (ref 30.0–36.0)
MCV: 86.1 fL (ref 80.0–100.0)
Monocytes Absolute: 0.5 10*3/uL (ref 0.1–1.0)
Monocytes Relative: 9 %
Neutro Abs: 3.3 10*3/uL (ref 1.7–7.7)
Neutrophils Relative %: 65 %
Platelets: 188 10*3/uL (ref 150–400)
RBC: 2.66 MIL/uL — ABNORMAL LOW (ref 3.87–5.11)
RDW: 15.2 % (ref 11.5–15.5)
WBC: 5.1 10*3/uL (ref 4.0–10.5)
nRBC: 0 % (ref 0.0–0.2)

## 2023-10-14 LAB — MAGNESIUM: Magnesium: 2.7 mg/dL — ABNORMAL HIGH (ref 1.7–2.4)

## 2023-10-14 MED ORDER — ENOXAPARIN SODIUM 40 MG/0.4ML IJ SOSY
40.0000 mg | PREFILLED_SYRINGE | INTRAMUSCULAR | Status: DC
  Administered 2023-10-14: 40 mg via SUBCUTANEOUS
  Filled 2023-10-14: qty 0.4

## 2023-10-14 NOTE — Progress Notes (Signed)
Progress Note    Joanne Patterson   WUJ:[id number]  DOB: [birth year]  DOA: 10/02/2023     11 PCP: Jarrett Soho, PA-C  Initial CC: Hallucinations  Hospital Course: Joanne Patterson is a 89 yo female with PMH recurrent UTIs, hypertension, DM II, HLD who presented up on recommendation from Center well after outpatient urine culture came back positive. Patient's daughter also provided collateral information stating that the patient was continuing to become more confused and hallucinating prior to admission.  They had obtained urine specimen on 09/26/2023 and had not yet started/been prescribed antibiotics prior to hospitalization. UA on admission showed large LE, negative nitrite, greater than 50 WBC.  She was started on Rocephin and admitted for further workup and monitoring.  Urine culture grew Klebsiella and Aerococcus. She completed 10-day course of Rocephin during hospitalization.  Interval History:  Patient rested most of yesterday and this morning her mentation appears improved.  She was able to tell me her name and that she is in the hospital.  Follows all commands easily and moving all extremities.  Still overall deconditioned. Updated family bedside today as well. Temperature has also normalized and no longer on [employer]  Assessment and Plan: * Acute cystitis-resolved as of 10/11/2023 - Main symptom appears to be hallucinations and altered mentation on admission.  Original urine specimen obtained 09/26/2023 outpatient and sent to [facility] for testing; appears patient told culture was positive on 11/25 and to go to ER - obtained fax report and is placed in chart; unfortunately only says "mixed urogenital flora, > 100k colonies). No other micro data sent -Urine culture inpatient grew Klebsiella and Aerococcus.  Patient completed 10-day course of Rocephin during hospitalization  Acute metabolic encephalopathy - Multifactorial at this point from mostly hospital delirium given  prolonged hospitalization but also some contribution from electrolyte derangements; lower suspicion from untreated UTI -Continue delirium precautions and reorienting patient is able -Ideally would like to get patient home back to normal environment -Okay to continue Seroquel and trazodone at night for now -Increased trazodone and seroquel dose on 12/5 and patient did sleep better but remained somnolent most of 12/6; will decrease doses if she awakens enough to take meds again, but will need to allow time for wash out - also given excessive lethargy on 12/6, will also check NH3 and VBG for thoroughness.  Both normal and reassuring -Mentation was improved on 10/14/2023 and patient was awake, alert, and talking better  Hypothermia-resolved as of 10/14/2023 - Patient developed hypothermia on 12/6 in the afternoon with rectal temp 93.8.  This also was when she was found to have significantly elevated urinary retention on bladder scan with urology present (greater than 450 cc).  Due to her level of sedation, not unsurprising that she is not able to void; suspect this was inevitable given some of her overall functional decline - tx to SDU for bair hugger; temperature improved overnight and now normalized on 10/14/2023.  No further need for Bair hugger -Urinalysis also negative for signs of infection and reassuring  Hyponatremia - Level has been fluctuating during hospitalization but appears to be stabilized at least for now - Nephrology has also been consulted, appreciate assistance - Some concern for underlying component of SIADH - Continue sodium chloride tablets; to be discontinued at discharge - Urea added 10/11/2023 per nephrology and discontinued on 10/12/2023 - continue trending   Acute urinary retention - Evaluated by urology as well, appreciate assistance - Still having ongoing incomplete bladder emptying with high residuals but  she is however voiding - due to hypothermia and increased bladder  scanning again on 12/6, decision made for foley placement when seen by urology; agree with placement - UA negative on 12/6  Chronic kidney disease, stage 3b (HCC) - patient has history of CKD3b. Baseline creat ~ 1.3 - 1.5, eGFR~ 35   Euthyroid sick syndrome - TSH 6.354 - FT4 considered normal 1.15; low free T3 as expected given rapid physiologic metabolism -No further workup indicated - Have recommended daughter to have repeat TSH and free T4 outpatient once patient is back to baseline from acute illness, tentatively in approximately 2 months  Physical deconditioning - Worsening frailty during hospitalization and patient now 2+ assist.  Daughter still declining SNF and plans on bringing patient home with ongoing home health  Essential hypertension - ARB on hold - Lasix resumed 12/5 per nephrology  - Continue amlodipine  Hyperlipidemia - hold Zocor   Old records reviewed in assessment of this patient  Antimicrobials: Rocephin 10/02/2023 >> 10/11/23  DVT prophylaxis:  enoxaparin (LOVENOX) injection 40 mg Start: 10/14/23 2200 SCDs Start: 10/02/23 1650   Code Status:   Code Status: Full Code  Mobility Assessment (Last 72 Hours)     Mobility Assessment     Row Name 10/14/23 0730 10/13/23 1500 10/13/23 0930 10/12/23 2016 10/12/23 0845   Does patient have an order for bedrest or is patient medically unstable No - Continue assessment -- No - Continue assessment No - Continue assessment No - Continue assessment   What is the highest level of mobility based on the progressive mobility assessment? Level 1 (Bedfast) - Unable to balance while sitting on edge of bed Level 1 (Bedfast) - Unable to balance while sitting on edge of bed Level 2 (Chairfast) - Balance while sitting on edge of bed and cannot stand Level 2 (Chairfast) - Balance while sitting on edge of bed and cannot stand Level 4 (Walks with assist in room) - Balance while marching in place and cannot step forward and back -  Complete   Is the above level different from baseline mobility prior to current illness? Yes - Recommend PT order -- Yes - Recommend PT order Yes - Recommend PT order Yes - Recommend PT order    Row Name 10/11/23 1945           Does patient have an order for bedrest or is patient medically unstable No - Continue assessment       What is the highest level of mobility based on the progressive mobility assessment? Level 4 (Walks with assist in room) - Balance while marching in place and cannot step forward and back - Complete       Is the above level different from baseline mobility prior to current illness? Yes - Recommend PT order                Barriers to discharge: none Disposition Plan:  Home Status is: Inpt  Objective: Blood pressure (!) 127/37, pulse 72, temperature 99.1 F (37.3 C), resp. rate 16, height 5\' 6"  (1.676 m), weight 85.7 kg, SpO2 96%.  Examination:  Physical Exam Constitutional:      Appearance: Normal appearance.     Comments: More awake and alert this morning and answering questions easily  HENT:     Head: Normocephalic and atraumatic.     Mouth/Throat:     Mouth: Mucous membranes are moist.  Eyes:     Extraocular Movements: Extraocular movements intact.  Cardiovascular:  Rate and Rhythm: Normal rate and regular rhythm.  Pulmonary:     Effort: Pulmonary effort is normal. No respiratory distress.     Breath sounds: Normal breath sounds. No wheezing.  Abdominal:     General: Bowel sounds are normal. There is no distension.     Palpations: Abdomen is soft.     Tenderness: There is no abdominal tenderness.  Musculoskeletal:        General: Swelling (3+ B/L LE) present. Normal range of motion.     Cervical back: Normal range of motion and neck supple.  Skin:    General: Skin is warm and dry.  Neurological:     Mental Status: She is disoriented.     Motor: Weakness (generalized but worse in LE) present.     Comments: Mentation overall improved       Consultants:  Nephrology-signed off Urology  Procedures:  10/13/2023: Foley catheter placement  Data Reviewed: Results for orders placed or performed during the hospital encounter of 10/02/23 (from the past 24 hour(s))  Ammonia     Status: None   Collection Time: 10/13/23  4:35 PM  Result Value Ref Range   Ammonia 21 9 - 35 umol/L  Blood gas, venous     Status: Abnormal   Collection Time: 10/13/23  4:35 PM  Result Value Ref Range   pH, Ven 7.41 7.25 - 7.43   pCO2, Ven 43 (L) 44 - 60 mmHg   pO2, Ven 55 (H) 32 - 45 mmHg   Bicarbonate 27.3 20.0 - 28.0 mmol/L   Acid-Base Excess 2.4 (H) 0.0 - 2.0 mmol/L   O2 Saturation 88.9 %   Patient temperature 37.0   Urinalysis, Complete w Microscopic -Urine, Catheterized     Status: Abnormal   Collection Time: 10/13/23  4:50 PM  Result Value Ref Range   Color, Urine STRAW (A) YELLOW   APPearance CLEAR CLEAR   Specific Gravity, Urine 1.006 1.005 - 1.030   pH 5.0 5.0 - 8.0   Glucose, UA NEGATIVE NEGATIVE mg/dL   Hgb urine dipstick NEGATIVE NEGATIVE   Bilirubin Urine NEGATIVE NEGATIVE   Ketones, ur NEGATIVE NEGATIVE mg/dL   Protein, ur NEGATIVE NEGATIVE mg/dL   Nitrite NEGATIVE NEGATIVE   Leukocytes,Ua NEGATIVE NEGATIVE   RBC / HPF 0-5 0 - 5 RBC/hpf   WBC, UA 0-5 0 - 5 WBC/hpf   Bacteria, UA NONE SEEN NONE SEEN   Squamous Epithelial / HPF 0-5 0 - 5 /HPF  Comprehensive metabolic panel     Status: Abnormal   Collection Time: 10/14/23  2:35 AM  Result Value Ref Range   Sodium 127 (L) 135 - 145 mmol/L   Potassium 4.3 3.5 - 5.1 mmol/L   Chloride 97 (L) 98 - 111 mmol/L   CO2 24 22 - 32 mmol/L   Glucose, Bld 70 70 - 99 mg/dL   BUN 51 (H) 8 - 23 mg/dL   Creatinine, Ser 8.29 (H) 0.44 - 1.00 mg/dL   Calcium 9.1 8.9 - 56.2 mg/dL   Total Protein 5.6 (L) 6.5 - 8.1 g/dL   Albumin 2.6 (L) 3.5 - 5.0 g/dL   AST 130 (H) 15 - 41 U/L   ALT 219 (H) 0 - 44 U/L   Alkaline Phosphatase 205 (H) 38 - 126 U/L   Total Bilirubin 0.4 <1.2 mg/dL   GFR,  Estimated 41 (L) >60 mL/min   Anion gap 6 5 - 15  CBC with Differential/Platelet     Status: Abnormal  Collection Time: 10/14/23  2:35 AM  Result Value Ref Range   WBC 5.1 4.0 - 10.5 K/uL   RBC 2.66 (L) 3.87 - 5.11 MIL/uL   Hemoglobin 7.7 (L) 12.0 - 15.0 g/dL   HCT 09.8 (L) 11.9 - 14.7 %   MCV 86.1 80.0 - 100.0 fL   MCH 28.9 26.0 - 34.0 pg   MCHC 33.6 30.0 - 36.0 g/dL   RDW 82.9 56.2 - 13.0 %   Platelets 188 150 - 400 K/uL   nRBC 0.0 0.0 - 0.2 %   Neutrophils Relative % 65 %   Neutro Abs 3.3 1.7 - 7.7 K/uL   Lymphocytes Relative 23 %   Lymphs Abs 1.2 0.7 - 4.0 K/uL   Monocytes Relative 9 %   Monocytes Absolute 0.5 0.1 - 1.0 K/uL   Eosinophils Relative 2 %   Eosinophils Absolute 0.1 0.0 - 0.5 K/uL   Basophils Relative 0 %   Basophils Absolute 0.0 0.0 - 0.1 K/uL   Immature Granulocytes 1 %   Abs Immature Granulocytes 0.04 0.00 - 0.07 K/uL  Magnesium     Status: Abnormal   Collection Time: 10/14/23  2:35 AM  Result Value Ref Range   Magnesium 2.7 (H) 1.7 - 2.4 mg/dL    I have reviewed pertinent nursing notes, vitals, labs, and images as necessary. I have ordered labwork to follow up on as indicated.  I have reviewed the last notes from staff over past 24 hours. I have discussed patient's care plan and test results with nursing staff, CM/SW, and other staff as appropriate.  Time spent: Greater than 50% of the 55 minute visit was spent in counseling/coordination of care for the patient as laid out in the A&P.   LOS: 11 days   Lewie Chamber, MD [facility] 10/14/2023, 4:23 PM

## 2023-10-14 NOTE — Plan of Care (Signed)
  Problem: Clinical Measurements: Goal: Will remain free from infection Outcome: Progressing Goal: Diagnostic test results will improve Outcome: Progressing Goal: Respiratory complications will improve Outcome: Progressing Goal: Cardiovascular complication will be avoided Outcome: Progressing   Problem: Nutrition: Goal: Adequate nutrition will be maintained Outcome: Progressing   Problem: Safety: Goal: Ability to remain free from injury will improve Outcome: Progressing

## 2023-10-14 NOTE — Progress Notes (Signed)
  Subjective: Patient doing well today temperature improved, sleeping in bed. Family is at bedside.   Objective: Vital signs in last 24 hours: Temp:  [93.2 F (34 C)-99 F (37.2 C)] 99 F (37.2 C) (12/07 1000) Pulse Rate:  [58-90] 82 (12/07 1000) Resp:  [0-19] 18 (12/07 0100) BP: (89-139)/(29-104) 125/47 (12/07 1028) SpO2:  [95 %-100 %] 99 % (12/07 1000)  Intake/Output from previous day: 12/06 0701 - 12/07 0700 In: -  Out: 1000 [Urine:1000] Intake/Output this shift: No intake/output data recorded.  Physical Exam:  General: sleeping  CV: RRR Lungs: Clear bilaterally. GI: Soft, Nondistended. GU: catheter in place draining clear yellow urine    Lab Results: Recent Labs    10/12/23 0702 10/13/23 0616 10/14/23 0235  HGB 8.4* 9.3* 7.7*  HCT 25.1* 27.3* 22.9*    Cr 1.24  Na: 127  Assessment/Plan: #mixed incontinence Temp improved today Catheter in place will remain in place until out of the ICU, creatinine decreased, still hyponatremic Scr trending with her baseline, no hydronephrosis, hyponatremia improved.  Family to reschedule for pelvic exam and diagnostic cystoscopy.  Resume daily suppressive Trimethoprim on discharge Continue flomax    #constipation Family reports frequent periods on constipation. Advised to use OTC daily Miralax, titrated to one BM per day. This will reduce the chance of bladder outlet obstruction 2/2 pelvic stool burden.    #hospital delirium Blinds opens at beginning of shift, lights out after dinner.  May benefit from melatonin Frequent reorientation  Vilma Prader MD.

## 2023-10-15 DIAGNOSIS — G9341 Metabolic encephalopathy: Secondary | ICD-10-CM | POA: Diagnosis not present

## 2023-10-15 DIAGNOSIS — T68XXXA Hypothermia, initial encounter: Secondary | ICD-10-CM | POA: Diagnosis not present

## 2023-10-15 DIAGNOSIS — E871 Hypo-osmolality and hyponatremia: Secondary | ICD-10-CM | POA: Diagnosis not present

## 2023-10-15 DIAGNOSIS — N3000 Acute cystitis without hematuria: Secondary | ICD-10-CM | POA: Diagnosis not present

## 2023-10-15 LAB — CBC WITH DIFFERENTIAL/PLATELET
Abs Immature Granulocytes: 0.05 10*3/uL (ref 0.00–0.07)
Basophils Absolute: 0 10*3/uL (ref 0.0–0.1)
Basophils Relative: 0 %
Eosinophils Absolute: 0.1 10*3/uL (ref 0.0–0.5)
Eosinophils Relative: 1 %
HCT: 23.9 % — ABNORMAL LOW (ref 36.0–46.0)
Hemoglobin: 7.7 g/dL — ABNORMAL LOW (ref 12.0–15.0)
Immature Granulocytes: 1 %
Lymphocytes Relative: 33 %
Lymphs Abs: 2.8 10*3/uL (ref 0.7–4.0)
MCH: 28.4 pg (ref 26.0–34.0)
MCHC: 32.2 g/dL (ref 30.0–36.0)
MCV: 88.2 fL (ref 80.0–100.0)
Monocytes Absolute: 0.8 10*3/uL (ref 0.1–1.0)
Monocytes Relative: 10 %
Neutro Abs: 4.7 10*3/uL (ref 1.7–7.7)
Neutrophils Relative %: 55 %
Platelets: 213 10*3/uL (ref 150–400)
RBC: 2.71 MIL/uL — ABNORMAL LOW (ref 3.87–5.11)
RDW: 15.3 % (ref 11.5–15.5)
WBC: 8.4 10*3/uL (ref 4.0–10.5)
nRBC: 0 % (ref 0.0–0.2)

## 2023-10-15 LAB — MAGNESIUM: Magnesium: 2.5 mg/dL — ABNORMAL HIGH (ref 1.7–2.4)

## 2023-10-15 LAB — COMPREHENSIVE METABOLIC PANEL
ALT: 210 U/L — ABNORMAL HIGH (ref 0–44)
AST: 156 U/L — ABNORMAL HIGH (ref 15–41)
Albumin: 2.5 g/dL — ABNORMAL LOW (ref 3.5–5.0)
Alkaline Phosphatase: 200 U/L — ABNORMAL HIGH (ref 38–126)
Anion gap: 8 (ref 5–15)
BUN: 43 mg/dL — ABNORMAL HIGH (ref 8–23)
CO2: 24 mmol/L (ref 22–32)
Calcium: 9.3 mg/dL (ref 8.9–10.3)
Chloride: 98 mmol/L (ref 98–111)
Creatinine, Ser: 1.41 mg/dL — ABNORMAL HIGH (ref 0.44–1.00)
GFR, Estimated: 35 mL/min — ABNORMAL LOW (ref >60)
Glucose, Bld: 71 mg/dL (ref 70–99)
Potassium: 4.5 mmol/L (ref 3.5–5.1)
Sodium: 130 mmol/L — ABNORMAL LOW (ref 135–145)
Total Bilirubin: 0.3 mg/dL (ref <1.2)
Total Protein: 5.7 g/dL — ABNORMAL LOW (ref 6.5–8.1)

## 2023-10-15 MED ORDER — SODIUM CHLORIDE 1 G PO TABS: 1.0000 g | ORAL_TABLET | Freq: Three times a day (TID) | ORAL | 0 refills | Status: AC

## 2023-10-15 MED ORDER — ENOXAPARIN SODIUM 30 MG/0.3ML IJ SOSY: 30.0000 mg | PREFILLED_SYRINGE | INTRAMUSCULAR | Status: DC

## 2023-10-15 NOTE — TOC Transition Note (Signed)
Transition of Care [facility]) - CM/SW Discharge Note   Patient Details  Name: Joanne Patterson MRN: [id number] Date of Birth: 1928/08/02  [facility]) CM/SW Contact:  Adrian Prows, RN Phone Number: 10/15/2023, 1:32 PM   Clinical Narrative:    D/C orders received; HHPT/OT/RN/Aide set up w/ [facility]; pt transport by PTAR; pt's family agrees to d/c plan; PTAR called at 1; spoke w/ operator # [id number]; no TOC needs.   Final next level of care: Home w Home Health Services Barriers to Discharge: No Barriers Identified   Patient Goals and CMS Choice CMS Medicare.gov Compare Post Acute Care list provided to:: Patient Represenative (must comment) (Daughter [name]) Choice offered to / list presented to : Adult Children  Discharge Placement                         Discharge Plan and Services Additional resources added to the After Visit Summary for       Post Acute Care Choice: Durable Medical Equipment, Home Health          DME Arranged: Dan Humphreys rolling DME Agency: [employer]       HH Arranged: RN, PT, OT, Nurse's Aide HH Agency: [facility] Date [facility] Agency Contacted: 10/15/23 Time HH Agency Contacted: [phone] Representative spoke with at [facility] Agency: Kandee Keen  Social Determinants of Health (SDOH) Interventions SDOH Screenings   Food Insecurity: No Food Insecurity (10/02/2023)  Housing: Low Risk  (10/02/2023)  Transportation Needs: No Transportation Needs (10/07/2023)  Recent Concern: Transportation Needs - Unmet Transportation Needs (10/02/2023)  Utilities: Not At Risk (10/02/2023)  Tobacco Use: Low Risk  (10/02/2023)     Readmission Risk Interventions    10/15/2023    1:26 PM  Readmission Risk Prevention Plan  Transportation Screening Complete  PCP or Specialist Appt within 3-5 Days Complete  HRI or Home Care Consult Complete  Social Work Consult for Recovery Care Planning/Counseling Complete  Palliative Care Screening Complete   Medication Review [employer]) Complete

## 2023-10-15 NOTE — TOC Progression Note (Addendum)
Transition of Care [facility]) - Progression Note    Patient Details  Name: Joanne Patterson MRN: [id number] Date of Birth: 10/06/1928  Transition [facility] Care [facility]) CM/SW Contact  Georgie Chard, LCSW Phone Number: 10/15/2023, 10:17 AM  Clinical Narrative:    CSW spoke with the patient's daughter in regards to the patient DC. At this time the daughter is wanting more care than [facility] can provide. This CSW has inquired that the daughter speak with someone at [facility] in regards to more care at home, and have them explain the difference to the daughter. CSW will follow up with the daughter after [facility] speaks to them.    Expected Discharge Plan: Home w Home Health Services Barriers to Discharge: Continued Medical Work up  Expected Discharge Plan and Services     Post Acute Care Choice: Durable Medical Equipment, Home Health Living arrangements for the past 2 months: Single Family Home Expected Discharge Date: 10/15/23               DME Arranged: Dan Humphreys rolling DME Agency: [employer]         [facility] Agency: [facility]         Social Determinants of Health (SDOH) Interventions SDOH Screenings   Food Insecurity: No Food Insecurity (10/02/2023)  Housing: Low Risk  (10/02/2023)  Transportation Needs: No Transportation Needs (10/07/2023)  Recent Concern: Transportation Needs - Unmet Transportation Needs (10/02/2023)  Utilities: Not At Risk (10/02/2023)  Tobacco Use: Low Risk  (10/02/2023)    Readmission Risk Interventions     No data to display

## 2023-10-15 NOTE — Progress Notes (Signed)
Occupational Therapy Treatment Patient Details Name: Joanne Patterson MRN: [id number] DOB: 1928/03/09 Today's Date: 10/15/2023   History of present illness Pt is a 89 y.o. female admitted with UTI. PMH significant for HTN, type II DM, hyperlipidemia, recurrent UTI and hyponatremia, L heel wound, R ankle sprain 08/2023, falls   OT comments  Patients session focused on family education for supporting patient and maintaining independence in next level of care. Patients family was vocal about keeping patient moving daily during session. Patient plan to d/c home with family support and [facility] services today. Patient's discharge plan remains appropriate at this time. OT will continue to follow acutely.        If plan is discharge home, recommend the following:  A lot of help with walking and/or transfers;A lot of help with bathing/dressing/bathroom;Assistance with cooking/housework;Assistance with feeding;Direct supervision/assist for medications management;Direct supervision/assist for financial management;Assist for transportation;Help with stairs or ramp for entrance;Supervision due to cognitive status   Equipment Recommendations  None recommended by OT       Precautions / Restrictions Precautions Precautions: Fall Restrictions Weight Bearing Restrictions: No              ADL either performed or assessed with clinical judgement   ADL Overall ADL's : Needs assistance/impaired         General ADL Comments: patient's daughter and granddaughter reported plan to take patient home. patient's family was educated on proper body mechanics to help patient participate in bed mobility, transfers, and toileting tasks. patients family reported that patient was walking prior to arrival at hospital. patient and family were educated that patient has not taken more than a few steps with RW with +2 A since admission to hospital and would not be up to that level immediatly upon return home. patients family  was receptive to education on compensatory strategies to keep patient engaging in tasks at home. patients family was able to verbalize understanding during session. plan is to d/c home today.      Cognition Arousal: Lethargic Behavior During Therapy: Flat affect Overall Cognitive Status: History of cognitive impairments - at baseline       General Comments: patients grand daugther and daughter were present in room during session.                   Pertinent Vitals/ Pain       Pain Assessment Pain Assessment: No/denies pain Faces Pain Scale: No hurt         Frequency  Min 1X/week        Progress Toward Goals  OT Goals(current goals can now be found in the care plan section)  Progress towards OT goals: Progressing toward goals (session focused on family education and allowed for patient to save energy to transition home later today)     Plan         AM-PAC OT "6 Clicks" Daily Activity     Outcome Measure   Help from another person eating meals?: A Little Help from another person taking care of personal grooming?: A Little Help from another person toileting, which includes using toliet, bedpan, or urinal?: A Lot Help from another person bathing (including washing, rinsing, drying)?: A Lot Help from another person to put on and taking off regular upper body clothing?: A Little Help from another person to put on and taking off regular lower body clothing?: A Lot 6 Click Score: 15    End of Session    OT Visit Diagnosis: Repeated falls (  R29.6);Muscle weakness (generalized) (M62.81);Unsteadiness on feet (R26.81);Other symptoms and signs involving cognitive function   Activity Tolerance     Patient Left in bed;with call bell/phone within reach;with bed alarm set;with family/visitor present   Nurse Communication Other (comment) (ok to participate in session)        Time: 5284-1324 OT Time Calculation (min): 17 min  Charges: OT General Charges $OT Visit:  1 Visit OT Treatments $Therapeutic Activity: 8-22 mins  Rosalio Loud, MS Acute Rehabilitation Department Office# [phone]   Selinda Flavin 10/15/2023, 12:22 PM

## 2023-10-15 NOTE — TOC Progression Note (Addendum)
Transition of Care [facility]) - Progression Note    Patient Details  Name: Joanne Patterson MRN: [id number] Date of Birth: 1928-06-20  Transition of Care [facility]) CM/SW Contact  Adrian Prows, RN Phone Number: 10/15/2023, 12:41 PM  Clinical Narrative:    Sherron Monday w/ pt's dtr and grand dtr in room; they have declined SNF and would like for pt to return home w/ [facility] services; they were previously serviced by [facility] but would like another agency to provide service; in the interim, referral placed to [facility] w/ Doreatha Martin, hospital liaison; per Revonda Standard she is  sending in the referral for [employer] and they will confirm tomorrow if they can accept case; [facility] team can confirm with family tomorrow if/what agency will be able to provide; contacted Marylene Land at [place]; she says agency can not provide service; awaiting response from [facility]; verified w/ Dr Frederick Peers [facility] needed for foley catheter and monitor left heel pressure injury.  -1255- notified by Herbert Seta at [facility] unable to accept due to staffing  -1300- spoke w/ Kandee Keen at [facility]; he says agency can provide services; family notified; agency contact info placed in follow up provider section of d/c instructions.    Expected Discharge Plan: Home w Home Health Services Barriers to Discharge: Continued Medical Work up  Expected Discharge Plan and Services     Post Acute Care Choice: Durable Medical Equipment, Home Health Living arrangements for the past 2 months: Single Family Home Expected Discharge Date: 10/15/23               DME Arranged: Dan Humphreys rolling DME Agency: [employer]         [facility] Agency: [facility]         Social Determinants of Health (SDOH) Interventions SDOH Screenings   Food Insecurity: No Food Insecurity (10/02/2023)  Housing: Low Risk  (10/02/2023)  Transportation Needs: No Transportation Needs (10/07/2023)  Recent Concern: Transportation Needs - Unmet  Transportation Needs (10/02/2023)  Utilities: Not At Risk (10/02/2023)  Tobacco Use: Low Risk  (10/02/2023)    Readmission Risk Interventions     No data to display

## 2023-10-15 NOTE — Discharge Instructions (Addendum)
Once home health is established, please have blood work checked within 1 week to check sodium levels and kidney function

## 2023-10-15 NOTE — Progress Notes (Signed)
Referral received from [facility] for family interest in hospice versus Palliative Care services at discharge.  Spoke to patient's daughter, Therisa Doyne, and granddaughter via phone to discuss hospice, palliative care and integrative health services at discharge.  Family are seeking assistance with urine samples and blood draws at home after discharge to keep patient out of hospital and at home as they are having difficulty getting her out of the house.  Family elected to move forward with Palliative Care and Integrative Health services if accepted.  Daughter is aware that Integrative Health team will be in contact with her the first part of the week to discuss acceptance into program if she qualifies and what services will look like.  Family has liaison phone number if additional questions arise.  Thank you for the opportunity to participate in this patient's care.  Doreatha Martin, RN, [facility] [phone]

## 2023-10-15 NOTE — Discharge Summary (Signed)
Physician Discharge Summary   LAQUISTA SHORB ZOX:[id number] DOB: [birth year] DOA: 10/02/2023  PCP: Jarrett Soho, PA-C  Admit date: 10/02/2023 Discharge date: 10/15/2023  Admitted From: Home Disposition:  Home Discharging physician: Lewie Chamber, MD Barriers to discharge: none  Recommendations at discharge: Repeat CMP within 1 week Follow up with urology; foley in place Repeat thyroid studies in 4-6 weeks Low threshold to consider palliative care and/or hospice if patient declines again soon as physical reserve extremely poor  Home Health: [facility] RN/PT/OT/aide Equipment/Devices: RW  Discharge Condition: stable CODE STATUS: Full Diet recommendation:  Diet Orders (From admission, onward)     Start     Ordered   10/15/23 0000  Diet general        10/15/23 0935   10/02/23 1720  Diet Carb Modified Fluid consistency: Thin  Diet effective now       Question Answer Comment  Calorie Level Medium 1600-2000   Fluid consistency: Thin      10/02/23 1719            Hospital Course: Ms. Vivenzio is a 89 yo female with PMH recurrent UTIs, hypertension, DM II, HLD who presented up on recommendation from Center well after outpatient urine culture came back positive. Patient's daughter also provided collateral information stating that the patient was continuing to become more confused and hallucinating prior to admission.  They had obtained urine specimen on 09/26/2023 and had not yet started/been prescribed antibiotics prior to hospitalization. UA on admission showed large LE, negative nitrite, greater than 50 WBC.  She was started on Rocephin and admitted for further workup and monitoring.  Urine culture grew Klebsiella and Aerococcus. She completed 10-day course of Rocephin during hospitalization.  Assessment and Plan: * Acute cystitis-resolved as of 10/11/2023 - Main symptom appears to be hallucinations and altered mentation on admission.  Original urine specimen  obtained 09/26/2023 outpatient and sent to [facility] for testing; appears patient told culture was positive on 11/25 and to go to ER - obtained fax report and is placed in chart; unfortunately only says "mixed urogenital flora, > 100k colonies). No other micro data sent -Urine culture inpatient grew Klebsiella and Aerococcus.  Patient completed 10-day course of Rocephin during hospitalization  Hypothermia-resolved as of 10/14/2023 - Patient developed hypothermia on 12/6 in the afternoon with rectal temp 93.8.  This also was when she was found to have significantly elevated urinary retention on bladder scan with urology present (greater than 450 cc).  Due to her level of sedation, not unsurprising that she is not able to void; suspect this was inevitable given some of her overall functional decline - tx to SDU for bair hugger; temperature improved overnight and now normalized on 10/14/2023.  No further need for Bair hugger -Urinalysis also negative for signs of infection and reassuring  Acute metabolic encephalopathy-resolved as of 10/15/2023 - Multifactorial at this point from mostly hospital delirium given prolonged hospitalization but also some contribution from electrolyte derangements; lower suspicion from untreated UTI -Continue delirium precautions and reorienting patient is able -Ideally would like to get patient home back to normal environment -Okay to continue Seroquel and trazodone at night for now -Increased trazodone and seroquel dose on 12/5 and patient did sleep better but remained somnolent most of 12/6; will decrease doses if she awakens enough to take meds again, but will need to allow time for wash out - also given excessive lethargy on 12/6, will also check NH3 and VBG for thoroughness.  Both normal and reassuring -  Mentation was improved on 10/14/2023 and patient was awake, alert, and talking better -Even more awake and alert on 10/15/2023  Hyponatremia - Level has been fluctuating  during hospitalization but appears to be stabilized at least for now - Nephrology has also been consulted, appreciate assistance - Some concern for underlying component of SIADH - Continue sodium chloride tablets; to be discontinued at discharge - Urea added 10/11/2023 per nephrology and discontinued on 10/12/2023 -Sodium improved to 130 on day of discharge.  3 more days of salt tabs given at discharge -Needs repeat BMP within a week; hopefully through home health  Acute urinary retention - Evaluated by urology as well, appreciate assistance - Still having ongoing incomplete bladder emptying with high residuals but she is however voiding - due to hypothermia and increased bladder scanning again on 12/6, decision made for foley placement when seen by urology; agree with placement - UA negative on 12/6 -Outpatient follow-up with urology.  Foley in place at discharge  Chronic kidney disease, stage 3b [facility]) - patient has history of CKD3b. Baseline creat ~ 1.3 - 1.5, eGFR~ 35   Euthyroid sick syndrome - TSH 6.354 - FT4 considered normal 1.15; low free T3 as expected given rapid physiologic metabolism -No further workup indicated - Have recommended daughter to have repeat TSH and free T4 outpatient once patient is back to baseline from acute illness, tentatively in approximately 2 months  Physical deconditioning - Worsening frailty during hospitalization and patient now 2+ assist.  Daughter still declining SNF and plans on bringing patient home with ongoing home health  Essential hypertension - ARB resumed at discharge - Lasix resumed 12/5 per nephrology  - Continue amlodipine  Hyperlipidemia - Zocor   The patient's acute and chronic medical conditions were treated accordingly. On day of discharge, patient was felt deemed stable for discharge. Patient/family member advised to call PCP or come back to ER if needed.   Principal Diagnosis: Acute cystitis  Discharge Diagnoses: Active  Hospital Problems   Diagnosis Date Noted   Hyponatremia 08/16/2023    Priority: 2.   Acute urinary retention 10/11/2023    Priority: 3.   Chronic kidney disease, stage 3b (HCC) 08/16/2023    Priority: 3.   Euthyroid sick syndrome 10/11/2023    Priority: 4.   Physical deconditioning 10/11/2023    Priority: 5.   Essential hypertension 08/16/2023    Priority: 5.   Lower urinary tract infectious disease 10/04/2023   Hyperlipidemia 08/16/2023    Resolved Hospital Problems   Diagnosis Date Noted Date Resolved   Acute cystitis 10/02/2023 10/11/2023    Priority: 1.   Hypothermia 10/13/2023 10/14/2023    Priority: 1.   Acute metabolic encephalopathy 10/11/2023 10/15/2023    Priority: 1.     Discharge Instructions     Diet general   Complete by: As directed    Increase activity slowly   Complete by: As directed    No wound care   Complete by: As directed       Allergies as of 10/15/2023       Reactions   Diphenhydramine Other (See Comments)   Hallucinations, sleepiness        Medication List     STOP taking these medications    simvastatin 20 MG tablet Commonly known as: ZOCOR       TAKE these medications    amLODipine 10 MG tablet Commonly known as: NORVASC Take 10 mg by mouth daily.   aspirin EC 81 MG tablet Take 81  mg by mouth every Monday, Wednesday, and Friday. Swallow whole.   Centrum Silver Ultra Womens Tabs Take 1 tablet by mouth daily with breakfast.   Fish Oil 1000 MG Caps Take 1,000 mg by mouth daily.   furosemide 20 MG tablet Commonly known as: LASIX Take 0.5 tablets (10 mg total) by mouth daily.   loratadine 10 MG tablet Commonly known as: CLARITIN Take 10 mg by mouth daily as needed for allergies or rhinitis.   olmesartan 40 MG tablet Commonly known as: BENICAR Take 1 tablet (40 mg total) by mouth daily.   Pataday 0.2 % Soln Generic drug: Olopatadine HCl Place 1 drop into both eyes 2 (two) times daily.   polyvinyl alcohol 1.4  % ophthalmic solution Commonly known as: LIQUIFILM TEARS Place 1 drop into both eyes as needed for dry eyes.   Refresh Tears PF 0.5-0.9 % Soln Generic drug: Carboxymethylcell-Glycerin PF Place 1 drop into both eyes in the morning.   senna 8.6 MG Tabs tablet Commonly known as: SENOKOT Take 2 tablets (17.2 mg total) by mouth daily. What changed:  when to take this reasons to take this   sodium chloride 1 g tablet Take 1 tablet (1 g total) by mouth 3 (three) times daily with meals for 3 days.   TYLENOL 500 MG tablet Generic drug: acetaminophen Take 500-1,000 mg by mouth every 8 (eight) hours as needed for mild pain (pain score 1-3) or headache.   vitamin C 1000 MG tablet Take 1,000 mg by mouth daily.        Follow-up Information     Wyline Mood, NP. Schedule an appointment as soon as possible for a visit in 1 week(s).   Specialty: Urology Contact information: [address] [place] [place] [address] [phone]         Care, [facility] Follow up.   Specialty: Home Health Services Contact information: [address] [place] [address] [phone]                Allergies  Allergen Reactions   Diphenhydramine Other (See Comments)    Hallucinations, sleepiness    Consultations: Nephrology  Urology  Procedures: 10/13/2023: Foley catheter placement   Discharge Exam: BP 132/63 (BP Location: Right Arm)   Pulse 67   Temp (!) 97.5 F (36.4 C) (Bladder)   Resp 13   Ht 5\' 6"  (1.676 m)   Wt 85.7 kg   SpO2 99%   BMI 30.51 kg/m  Physical Exam Constitutional:      Appearance: Normal appearance.     Comments: More awake and alert this morning and answering questions easily  HENT:     Head: Normocephalic and atraumatic.     Mouth/Throat:     Mouth: Mucous membranes are moist.  Eyes:     Extraocular Movements: Extraocular movements intact.  Cardiovascular:     Rate and Rhythm: Normal rate and regular rhythm.   Pulmonary:     Effort: Pulmonary effort is normal. No respiratory distress.     Breath sounds: Normal breath sounds. No wheezing.  Abdominal:     General: Bowel sounds are normal. There is no distension.     Palpations: Abdomen is soft.     Tenderness: There is no abdominal tenderness.  Musculoskeletal:        General: Swelling (3+ B/L LE) present. Normal range of motion.     Cervical back: Normal range of motion and neck supple.  Skin:  General: Skin is warm and dry.  Neurological:     Mental Status: She is disoriented.     Motor: Weakness (generalized but worse in LE) present.     Comments: Mentation overall improved      The results of significant diagnostics from this hospitalization (including imaging, microbiology, ancillary and laboratory) are listed below for reference.   Microbiology: Recent Results (from the past 240 hour(s))  MRSA Next Gen by PCR, Nasal     Status: None   Collection Time: 10/13/23  4:10 PM   Specimen: Urine, Catheterized; Nasal Swab  Result Value Ref Range Status   MRSA by PCR Next Gen NOT DETECTED NOT DETECTED Final    Comment: (NOTE) The GeneXpert MRSA Assay (FDA approved for NASAL specimens only), is one component of a comprehensive MRSA colonization surveillance program. It is not intended to diagnose MRSA infection nor to guide or monitor treatment for MRSA infections. Test performance is not FDA approved in patients less than 39 years old. Performed at [facility], [address] [place], [place] [address]      Labs: BNP (last 3 results) Recent Labs    08/18/23 0837  BNP 56.6   Basic Metabolic Panel: Recent Labs  Lab 10/11/23 0606 10/12/23 0702 10/13/23 0616 10/14/23 0235 10/15/23 0241  NA 124* 129* 127* 127* 130*  K 4.1 4.2 4.7 4.3 4.5  CL 92* 97* 94* 97* 98  CO2 21* 22 23 24 24   GLUCOSE 124* 115* 99 70 71  BUN 40* 67* 58* 51* 43*  CREATININE 1.63* 1.49* 1.41* 1.24* 1.41*  CALCIUM 9.4 9.9 9.9 9.1  9.3  MG  --  2.8* 2.7* 2.7* 2.5*   Liver Function Tests: Recent Labs  Lab 10/11/23 0606 10/12/23 0702 10/13/23 0616 10/14/23 0235 10/15/23 0241  AST 324* 262* 203* 140* 156*  ALT 369* 331* 295* 219* 210*  ALKPHOS 293* 264* 271* 205* 200*  BILITOT 0.6 0.2 0.4 0.4 0.3  PROT 6.2* 6.0* 6.3* 5.6* 5.7*  ALBUMIN 2.7* 2.8* 2.8* 2.6* 2.5*   No results for input(s): "LIPASE", "AMYLASE" in the last 168 hours. Recent Labs  Lab 10/13/23 1635  AMMONIA 21   CBC: Recent Labs  Lab 10/11/23 0606 10/12/23 0702 10/13/23 0616 10/14/23 0235 10/15/23 0241  WBC 8.0 6.3 7.4 5.1 8.4  NEUTROABS  --   --  4.8 3.3 4.7  HGB 8.9* 8.4* 9.3* 7.7* 7.7*  HCT 25.5* 25.1* 27.3* 22.9* 23.9*  MCV 83.9 85.4 85.8 86.1 88.2  PLT 202 202 201 188 213   Cardiac Enzymes: No results for input(s): "CKTOTAL", "CKMB", "CKMBINDEX", "TROPONINI" in the last 168 hours. BNP: Invalid input(s): "POCBNP" CBG: Recent Labs  Lab 10/13/23 1613  GLUCAP 82   D-Dimer No results for input(s): "DDIMER" in the last 72 hours. Hgb A1c No results for input(s): "HGBA1C" in the last 72 hours. Lipid Profile No results for input(s): "CHOL", "HDL", "LDLCALC", "TRIG", "CHOLHDL", "LDLDIRECT" in the last 72 hours. Thyroid function studies No results for input(s): "TSH", "T4TOTAL", "T3FREE", "THYROIDAB" in the last 72 hours.  Invalid input(s): "FREET3" Anemia work up No results for input(s): "VITAMINB12", "FOLATE", "FERRITIN", "TIBC", "IRON", "RETICCTPCT" in the last 72 hours. Urinalysis    Component Value Date/Time   COLORURINE STRAW (A) 10/13/2023 1650   APPEARANCEUR CLEAR 10/13/2023 1650   LABSPEC 1.006 10/13/2023 1650   PHURINE 5.0 10/13/2023 1650   GLUCOSEU NEGATIVE 10/13/2023 1650   HGBUR NEGATIVE 10/13/2023 1650   BILIRUBINUR NEGATIVE 10/13/2023 1650   KETONESUR  NEGATIVE 10/13/2023 1650   PROTEINUR NEGATIVE 10/13/2023 1650   NITRITE NEGATIVE 10/13/2023 1650   LEUKOCYTESUR NEGATIVE 10/13/2023 1650   Sepsis  Labs Recent Labs  Lab 10/12/23 0702 10/13/23 0616 10/14/23 0235 10/15/23 0241  WBC 6.3 7.4 5.1 8.4   Microbiology Recent Results (from the past 240 hour(s))  MRSA Next Gen by PCR, Nasal     Status: None   Collection Time: 10/13/23  4:10 PM   Specimen: Urine, Catheterized; Nasal Swab  Result Value Ref Range Status   MRSA by PCR Next Gen NOT DETECTED NOT DETECTED Final    Comment: (NOTE) The GeneXpert MRSA Assay (FDA approved for NASAL specimens only), is one component of a comprehensive MRSA colonization surveillance program. It is not intended to diagnose MRSA infection nor to guide or monitor treatment for MRSA infections. Test performance is not FDA approved in patients less than 51 years old. Performed at [facility], [address], [place], [place] [address]     Procedures/Studies: US RENAL  Result Date: 10/09/2023 CLINICAL DATA:  Acute kidney injury EXAM: RENAL / URINARY TRACT ULTRASOUND COMPLETE COMPARISON:  03/03/2023 FINDINGS: Right Kidney: Renal measurements: 8.7 x 5 x 6.4 cm = volume: 146 mL. Diffuse parenchymal thinning with increased parenchymal echotexture suggesting chronic medical renal disease. Small circumscribed cysts are demonstrated, largest measuring 3.1 cm maximal diameter. No change since prior study. No imaging follow-up is indicated. No hydronephrosis or solid mass. Left Kidney: Renal measurements: 9.5 x 5.7 x 6 cm = volume: 170 mL. Diffuse parenchymal thinning with increased parenchymal echotexture suggesting chronic medical renal disease. Small circumscribed cysts are demonstrated, largest measuring 3.4 cm maximal diameter. No change since prior study. No imaging follow-up is indicated. No hydronephrosis or solid mass. Bladder: Appears normal for degree of bladder distention. Other: None. IMPRESSION: Bilateral renal parenchymal thinning and increased echotexture suggesting chronic medical renal disease. No hydronephrosis. Electronically  Signed   By: Burman Nieves M.D.   On: 10/09/2023 22:11   DG HIPS BILAT WITH PELVIS 2V  Result Date: 10/09/2023 CLINICAL DATA:  Hip pain EXAM: DG HIP (WITH OR WITHOUT PELVIS) 2V BILAT COMPARISON:  None Available. FINDINGS: SI joint degenerative change. Pubic symphysis appears intact. No acute fracture or malalignment. Probable stool within the rectum overlying the pubic symphysis. Mild bilateral hip degenerative changes. Vascular calcifications. IMPRESSION: Mild bilateral hip degenerative changes. Electronically Signed   By: Jasmine Pang M.D.   On: 10/09/2023 15:06     Time coordinating discharge: Over 30 minutes    Lewie Chamber, MD  [facility] 10/15/2023, 1:59 PM

## 2023-10-15 NOTE — Plan of Care (Signed)
  Problem: Clinical Measurements: Goal: Will remain free from infection Outcome: Progressing Goal: Diagnostic test results will improve Outcome: Progressing Goal: Respiratory complications will improve Outcome: Progressing Goal: Cardiovascular complication will be avoided Outcome: Progressing   Problem: Nutrition: Goal: Adequate nutrition will be maintained Outcome: Progressing   Problem: Safety: Goal: Ability to remain free from injury will improve Outcome: Progressing

## 2023-10-15 NOTE — Progress Notes (Signed)
Patient discharge instructions were give to patient's family and patient. Patient's belongings were given back to patient's family. Patient's personal clothes were placed back on patient. Patient's PIV was removed. Foley catheter with leg drainage bag was left in per MD orders. Patient discharge to home with [facility] transporting home.

## 2023-10-17 DIAGNOSIS — I129 Hypertensive chronic kidney disease with stage 1 through stage 4 chronic kidney disease, or unspecified chronic kidney disease: Secondary | ICD-10-CM | POA: Diagnosis not present

## 2023-10-17 DIAGNOSIS — Z466 Encounter for fitting and adjustment of urinary device: Secondary | ICD-10-CM | POA: Diagnosis not present

## 2023-10-17 DIAGNOSIS — R339 Retention of urine, unspecified: Secondary | ICD-10-CM | POA: Diagnosis not present

## 2023-10-17 DIAGNOSIS — E785 Hyperlipidemia, unspecified: Secondary | ICD-10-CM | POA: Diagnosis not present

## 2023-10-17 DIAGNOSIS — N1832 Chronic kidney disease, stage 3b: Secondary | ICD-10-CM | POA: Diagnosis not present

## 2023-10-17 DIAGNOSIS — E0781 Sick-euthyroid syndrome: Secondary | ICD-10-CM | POA: Diagnosis not present

## 2023-10-17 DIAGNOSIS — S70321D Blister (nonthermal), right thigh, subsequent encounter: Secondary | ICD-10-CM | POA: Diagnosis not present

## 2023-10-17 DIAGNOSIS — E1122 Type 2 diabetes mellitus with diabetic chronic kidney disease: Secondary | ICD-10-CM | POA: Diagnosis not present

## 2023-10-17 DIAGNOSIS — E871 Hypo-osmolality and hyponatremia: Secondary | ICD-10-CM | POA: Diagnosis not present

## 2023-10-17 DIAGNOSIS — S30820D Blister (nonthermal) of lower back and pelvis, subsequent encounter: Secondary | ICD-10-CM | POA: Diagnosis not present

## 2023-10-17 DIAGNOSIS — M160 Bilateral primary osteoarthritis of hip: Secondary | ICD-10-CM | POA: Diagnosis not present

## 2023-10-18 DIAGNOSIS — R339 Retention of urine, unspecified: Secondary | ICD-10-CM | POA: Diagnosis not present

## 2023-10-18 DIAGNOSIS — E0781 Sick-euthyroid syndrome: Secondary | ICD-10-CM | POA: Diagnosis not present

## 2023-10-18 DIAGNOSIS — Z466 Encounter for fitting and adjustment of urinary device: Secondary | ICD-10-CM | POA: Diagnosis not present

## 2023-10-18 DIAGNOSIS — E1122 Type 2 diabetes mellitus with diabetic chronic kidney disease: Secondary | ICD-10-CM | POA: Diagnosis not present

## 2023-10-20 DIAGNOSIS — E871 Hypo-osmolality and hyponatremia: Secondary | ICD-10-CM | POA: Diagnosis not present

## 2023-10-20 DIAGNOSIS — E1122 Type 2 diabetes mellitus with diabetic chronic kidney disease: Secondary | ICD-10-CM | POA: Diagnosis not present

## 2023-10-20 DIAGNOSIS — R339 Retention of urine, unspecified: Secondary | ICD-10-CM | POA: Diagnosis not present

## 2023-10-20 DIAGNOSIS — Z466 Encounter for fitting and adjustment of urinary device: Secondary | ICD-10-CM | POA: Diagnosis not present

## 2023-10-20 DIAGNOSIS — E0781 Sick-euthyroid syndrome: Secondary | ICD-10-CM | POA: Diagnosis not present

## 2023-10-24 DIAGNOSIS — N189 Chronic kidney disease, unspecified: Secondary | ICD-10-CM | POA: Diagnosis not present

## 2023-10-24 DIAGNOSIS — R945 Abnormal results of liver function studies: Secondary | ICD-10-CM | POA: Diagnosis not present

## 2023-10-24 DIAGNOSIS — E871 Hypo-osmolality and hyponatremia: Secondary | ICD-10-CM | POA: Diagnosis not present

## 2023-10-24 DIAGNOSIS — I10 Essential (primary) hypertension: Secondary | ICD-10-CM | POA: Diagnosis not present

## 2023-10-26 DIAGNOSIS — E871 Hypo-osmolality and hyponatremia: Secondary | ICD-10-CM | POA: Diagnosis not present

## 2023-10-26 DIAGNOSIS — Z466 Encounter for fitting and adjustment of urinary device: Secondary | ICD-10-CM | POA: Diagnosis not present

## 2023-10-26 DIAGNOSIS — R339 Retention of urine, unspecified: Secondary | ICD-10-CM | POA: Diagnosis not present

## 2023-10-26 DIAGNOSIS — E1122 Type 2 diabetes mellitus with diabetic chronic kidney disease: Secondary | ICD-10-CM | POA: Diagnosis not present

## 2023-10-26 DIAGNOSIS — E0781 Sick-euthyroid syndrome: Secondary | ICD-10-CM | POA: Diagnosis not present

## 2023-11-02 DIAGNOSIS — E0781 Sick-euthyroid syndrome: Secondary | ICD-10-CM | POA: Diagnosis not present

## 2023-11-02 DIAGNOSIS — Z466 Encounter for fitting and adjustment of urinary device: Secondary | ICD-10-CM | POA: Diagnosis not present

## 2023-11-02 DIAGNOSIS — E871 Hypo-osmolality and hyponatremia: Secondary | ICD-10-CM | POA: Diagnosis not present

## 2023-11-02 DIAGNOSIS — R339 Retention of urine, unspecified: Secondary | ICD-10-CM | POA: Diagnosis not present

## 2023-11-02 DIAGNOSIS — E1122 Type 2 diabetes mellitus with diabetic chronic kidney disease: Secondary | ICD-10-CM | POA: Diagnosis not present

## 2023-11-06 ENCOUNTER — Ambulatory Visit: Payer: Medicare Other | Admitting: Cardiology

## 2023-11-06 DIAGNOSIS — R350 Frequency of micturition: Secondary | ICD-10-CM | POA: Diagnosis not present

## 2023-11-06 DIAGNOSIS — R3914 Feeling of incomplete bladder emptying: Secondary | ICD-10-CM | POA: Diagnosis not present

## 2023-11-06 DIAGNOSIS — N3020 Other chronic cystitis without hematuria: Secondary | ICD-10-CM | POA: Diagnosis not present

## 2023-11-07 DIAGNOSIS — Z466 Encounter for fitting and adjustment of urinary device: Secondary | ICD-10-CM | POA: Diagnosis not present

## 2023-11-07 DIAGNOSIS — E1122 Type 2 diabetes mellitus with diabetic chronic kidney disease: Secondary | ICD-10-CM | POA: Diagnosis not present

## 2023-11-07 DIAGNOSIS — E0781 Sick-euthyroid syndrome: Secondary | ICD-10-CM | POA: Diagnosis not present

## 2023-11-07 DIAGNOSIS — R339 Retention of urine, unspecified: Secondary | ICD-10-CM | POA: Diagnosis not present

## 2023-11-10 DIAGNOSIS — Z466 Encounter for fitting and adjustment of urinary device: Secondary | ICD-10-CM | POA: Diagnosis not present

## 2023-11-10 DIAGNOSIS — E1122 Type 2 diabetes mellitus with diabetic chronic kidney disease: Secondary | ICD-10-CM | POA: Diagnosis not present

## 2023-11-10 DIAGNOSIS — R339 Retention of urine, unspecified: Secondary | ICD-10-CM | POA: Diagnosis not present

## 2023-11-10 DIAGNOSIS — E0781 Sick-euthyroid syndrome: Secondary | ICD-10-CM | POA: Diagnosis not present

## 2023-11-13 DIAGNOSIS — E0781 Sick-euthyroid syndrome: Secondary | ICD-10-CM | POA: Diagnosis not present

## 2023-11-13 DIAGNOSIS — E871 Hypo-osmolality and hyponatremia: Secondary | ICD-10-CM | POA: Diagnosis not present

## 2023-11-13 DIAGNOSIS — Z466 Encounter for fitting and adjustment of urinary device: Secondary | ICD-10-CM | POA: Diagnosis not present

## 2023-11-13 DIAGNOSIS — E1122 Type 2 diabetes mellitus with diabetic chronic kidney disease: Secondary | ICD-10-CM | POA: Diagnosis not present

## 2023-11-13 DIAGNOSIS — R339 Retention of urine, unspecified: Secondary | ICD-10-CM | POA: Diagnosis not present

## 2023-11-22 DIAGNOSIS — E0781 Sick-euthyroid syndrome: Secondary | ICD-10-CM | POA: Diagnosis not present

## 2023-11-22 DIAGNOSIS — R339 Retention of urine, unspecified: Secondary | ICD-10-CM | POA: Diagnosis not present

## 2023-11-22 DIAGNOSIS — Z466 Encounter for fitting and adjustment of urinary device: Secondary | ICD-10-CM | POA: Diagnosis not present

## 2023-11-22 DIAGNOSIS — E1122 Type 2 diabetes mellitus with diabetic chronic kidney disease: Secondary | ICD-10-CM | POA: Diagnosis not present

## 2023-12-06 DIAGNOSIS — R3914 Feeling of incomplete bladder emptying: Secondary | ICD-10-CM | POA: Diagnosis not present

## 2023-12-06 DIAGNOSIS — N390 Urinary tract infection, site not specified: Secondary | ICD-10-CM | POA: Diagnosis not present

## 2023-12-06 DIAGNOSIS — N3020 Other chronic cystitis without hematuria: Secondary | ICD-10-CM | POA: Diagnosis not present

## 2023-12-08 DIAGNOSIS — E1122 Type 2 diabetes mellitus with diabetic chronic kidney disease: Secondary | ICD-10-CM | POA: Diagnosis not present

## 2023-12-08 DIAGNOSIS — E0781 Sick-euthyroid syndrome: Secondary | ICD-10-CM | POA: Diagnosis not present

## 2023-12-08 DIAGNOSIS — Z466 Encounter for fitting and adjustment of urinary device: Secondary | ICD-10-CM | POA: Diagnosis not present

## 2023-12-08 DIAGNOSIS — R339 Retention of urine, unspecified: Secondary | ICD-10-CM | POA: Diagnosis not present

## 2023-12-08 DIAGNOSIS — E871 Hypo-osmolality and hyponatremia: Secondary | ICD-10-CM | POA: Diagnosis not present

## 2023-12-13 DIAGNOSIS — L89322 Pressure ulcer of left buttock, stage 2: Secondary | ICD-10-CM | POA: Diagnosis not present

## 2023-12-13 DIAGNOSIS — I129 Hypertensive chronic kidney disease with stage 1 through stage 4 chronic kidney disease, or unspecified chronic kidney disease: Secondary | ICD-10-CM | POA: Diagnosis not present

## 2023-12-13 DIAGNOSIS — E1122 Type 2 diabetes mellitus with diabetic chronic kidney disease: Secondary | ICD-10-CM | POA: Diagnosis not present

## 2023-12-13 DIAGNOSIS — N1832 Chronic kidney disease, stage 3b: Secondary | ICD-10-CM | POA: Diagnosis not present

## 2023-12-13 DIAGNOSIS — L89312 Pressure ulcer of right buttock, stage 2: Secondary | ICD-10-CM | POA: Diagnosis not present

## 2023-12-21 DIAGNOSIS — E1122 Type 2 diabetes mellitus with diabetic chronic kidney disease: Secondary | ICD-10-CM | POA: Diagnosis not present

## 2023-12-21 DIAGNOSIS — L89322 Pressure ulcer of left buttock, stage 2: Secondary | ICD-10-CM | POA: Diagnosis not present

## 2023-12-21 DIAGNOSIS — N1832 Chronic kidney disease, stage 3b: Secondary | ICD-10-CM | POA: Diagnosis not present

## 2023-12-21 DIAGNOSIS — I129 Hypertensive chronic kidney disease with stage 1 through stage 4 chronic kidney disease, or unspecified chronic kidney disease: Secondary | ICD-10-CM | POA: Diagnosis not present

## 2023-12-21 DIAGNOSIS — L89312 Pressure ulcer of right buttock, stage 2: Secondary | ICD-10-CM | POA: Diagnosis not present

## 2023-12-28 DIAGNOSIS — L89322 Pressure ulcer of left buttock, stage 2: Secondary | ICD-10-CM | POA: Diagnosis not present

## 2023-12-28 DIAGNOSIS — I129 Hypertensive chronic kidney disease with stage 1 through stage 4 chronic kidney disease, or unspecified chronic kidney disease: Secondary | ICD-10-CM | POA: Diagnosis not present

## 2023-12-28 DIAGNOSIS — E1122 Type 2 diabetes mellitus with diabetic chronic kidney disease: Secondary | ICD-10-CM | POA: Diagnosis not present

## 2023-12-28 DIAGNOSIS — L89312 Pressure ulcer of right buttock, stage 2: Secondary | ICD-10-CM | POA: Diagnosis not present

## 2023-12-28 DIAGNOSIS — E871 Hypo-osmolality and hyponatremia: Secondary | ICD-10-CM | POA: Diagnosis not present

## 2023-12-28 DIAGNOSIS — N1832 Chronic kidney disease, stage 3b: Secondary | ICD-10-CM | POA: Diagnosis not present

## 2024-01-04 DIAGNOSIS — E1122 Type 2 diabetes mellitus with diabetic chronic kidney disease: Secondary | ICD-10-CM | POA: Diagnosis not present

## 2024-01-04 DIAGNOSIS — L89322 Pressure ulcer of left buttock, stage 2: Secondary | ICD-10-CM | POA: Diagnosis not present

## 2024-01-04 DIAGNOSIS — N1832 Chronic kidney disease, stage 3b: Secondary | ICD-10-CM | POA: Diagnosis not present

## 2024-01-04 DIAGNOSIS — I129 Hypertensive chronic kidney disease with stage 1 through stage 4 chronic kidney disease, or unspecified chronic kidney disease: Secondary | ICD-10-CM | POA: Diagnosis not present

## 2024-01-04 DIAGNOSIS — L89312 Pressure ulcer of right buttock, stage 2: Secondary | ICD-10-CM | POA: Diagnosis not present

## 2024-01-08 ENCOUNTER — Ambulatory Visit: Payer: Medicare Other | Admitting: Cardiology

## 2024-01-10 DIAGNOSIS — I129 Hypertensive chronic kidney disease with stage 1 through stage 4 chronic kidney disease, or unspecified chronic kidney disease: Secondary | ICD-10-CM | POA: Diagnosis not present

## 2024-01-10 DIAGNOSIS — L89322 Pressure ulcer of left buttock, stage 2: Secondary | ICD-10-CM | POA: Diagnosis not present

## 2024-01-10 DIAGNOSIS — E1122 Type 2 diabetes mellitus with diabetic chronic kidney disease: Secondary | ICD-10-CM | POA: Diagnosis not present

## 2024-01-10 DIAGNOSIS — L89312 Pressure ulcer of right buttock, stage 2: Secondary | ICD-10-CM | POA: Diagnosis not present

## 2024-01-10 DIAGNOSIS — N1832 Chronic kidney disease, stage 3b: Secondary | ICD-10-CM | POA: Diagnosis not present

## 2024-01-17 DIAGNOSIS — L89312 Pressure ulcer of right buttock, stage 2: Secondary | ICD-10-CM | POA: Diagnosis not present

## 2024-01-17 DIAGNOSIS — N1832 Chronic kidney disease, stage 3b: Secondary | ICD-10-CM | POA: Diagnosis not present

## 2024-01-17 DIAGNOSIS — I129 Hypertensive chronic kidney disease with stage 1 through stage 4 chronic kidney disease, or unspecified chronic kidney disease: Secondary | ICD-10-CM | POA: Diagnosis not present

## 2024-01-17 DIAGNOSIS — L89322 Pressure ulcer of left buttock, stage 2: Secondary | ICD-10-CM | POA: Diagnosis not present

## 2024-01-17 DIAGNOSIS — E1122 Type 2 diabetes mellitus with diabetic chronic kidney disease: Secondary | ICD-10-CM | POA: Diagnosis not present

## 2024-01-18 DIAGNOSIS — N189 Chronic kidney disease, unspecified: Secondary | ICD-10-CM | POA: Diagnosis not present

## 2024-01-18 DIAGNOSIS — E871 Hypo-osmolality and hyponatremia: Secondary | ICD-10-CM | POA: Diagnosis not present

## 2024-01-18 DIAGNOSIS — I10 Essential (primary) hypertension: Secondary | ICD-10-CM | POA: Diagnosis not present

## 2024-01-24 DIAGNOSIS — L89322 Pressure ulcer of left buttock, stage 2: Secondary | ICD-10-CM | POA: Diagnosis not present

## 2024-01-24 DIAGNOSIS — E871 Hypo-osmolality and hyponatremia: Secondary | ICD-10-CM | POA: Diagnosis not present

## 2024-01-24 DIAGNOSIS — I129 Hypertensive chronic kidney disease with stage 1 through stage 4 chronic kidney disease, or unspecified chronic kidney disease: Secondary | ICD-10-CM | POA: Diagnosis not present

## 2024-01-24 DIAGNOSIS — N1832 Chronic kidney disease, stage 3b: Secondary | ICD-10-CM | POA: Diagnosis not present

## 2024-01-24 DIAGNOSIS — L89312 Pressure ulcer of right buttock, stage 2: Secondary | ICD-10-CM | POA: Diagnosis not present

## 2024-01-24 DIAGNOSIS — E1122 Type 2 diabetes mellitus with diabetic chronic kidney disease: Secondary | ICD-10-CM | POA: Diagnosis not present

## 2024-01-30 DIAGNOSIS — E1122 Type 2 diabetes mellitus with diabetic chronic kidney disease: Secondary | ICD-10-CM | POA: Diagnosis not present

## 2024-01-30 DIAGNOSIS — N390 Urinary tract infection, site not specified: Secondary | ICD-10-CM | POA: Diagnosis not present

## 2024-01-30 DIAGNOSIS — N1832 Chronic kidney disease, stage 3b: Secondary | ICD-10-CM | POA: Diagnosis not present

## 2024-01-30 DIAGNOSIS — R350 Frequency of micturition: Secondary | ICD-10-CM | POA: Diagnosis not present

## 2024-01-30 DIAGNOSIS — E871 Hypo-osmolality and hyponatremia: Secondary | ICD-10-CM | POA: Diagnosis not present

## 2024-01-30 DIAGNOSIS — I129 Hypertensive chronic kidney disease with stage 1 through stage 4 chronic kidney disease, or unspecified chronic kidney disease: Secondary | ICD-10-CM | POA: Diagnosis not present

## 2024-02-01 DIAGNOSIS — L89312 Pressure ulcer of right buttock, stage 2: Secondary | ICD-10-CM | POA: Diagnosis not present

## 2024-02-01 DIAGNOSIS — N1832 Chronic kidney disease, stage 3b: Secondary | ICD-10-CM | POA: Diagnosis not present

## 2024-02-01 DIAGNOSIS — E1122 Type 2 diabetes mellitus with diabetic chronic kidney disease: Secondary | ICD-10-CM | POA: Diagnosis not present

## 2024-02-01 DIAGNOSIS — E871 Hypo-osmolality and hyponatremia: Secondary | ICD-10-CM | POA: Diagnosis not present

## 2024-02-01 DIAGNOSIS — I129 Hypertensive chronic kidney disease with stage 1 through stage 4 chronic kidney disease, or unspecified chronic kidney disease: Secondary | ICD-10-CM | POA: Diagnosis not present

## 2024-02-01 DIAGNOSIS — L89322 Pressure ulcer of left buttock, stage 2: Secondary | ICD-10-CM | POA: Diagnosis not present

## 2024-02-08 DIAGNOSIS — N1832 Chronic kidney disease, stage 3b: Secondary | ICD-10-CM | POA: Diagnosis not present

## 2024-02-08 DIAGNOSIS — L89312 Pressure ulcer of right buttock, stage 2: Secondary | ICD-10-CM | POA: Diagnosis not present

## 2024-02-08 DIAGNOSIS — I129 Hypertensive chronic kidney disease with stage 1 through stage 4 chronic kidney disease, or unspecified chronic kidney disease: Secondary | ICD-10-CM | POA: Diagnosis not present

## 2024-02-08 DIAGNOSIS — E1122 Type 2 diabetes mellitus with diabetic chronic kidney disease: Secondary | ICD-10-CM | POA: Diagnosis not present

## 2024-02-08 DIAGNOSIS — L89322 Pressure ulcer of left buttock, stage 2: Secondary | ICD-10-CM | POA: Diagnosis not present

## 2024-03-06 DIAGNOSIS — R350 Frequency of micturition: Secondary | ICD-10-CM | POA: Diagnosis not present

## 2024-03-06 DIAGNOSIS — N3020 Other chronic cystitis without hematuria: Secondary | ICD-10-CM | POA: Diagnosis not present

## 2024-03-12 ENCOUNTER — Ambulatory Visit: Payer: Medicare Other | Admitting: Cardiovascular Disease

## 2024-05-03 DIAGNOSIS — R350 Frequency of micturition: Secondary | ICD-10-CM | POA: Diagnosis not present

## 2024-05-03 DIAGNOSIS — E1122 Type 2 diabetes mellitus with diabetic chronic kidney disease: Secondary | ICD-10-CM | POA: Diagnosis not present

## 2024-05-03 DIAGNOSIS — N1832 Chronic kidney disease, stage 3b: Secondary | ICD-10-CM | POA: Diagnosis not present

## 2024-05-03 DIAGNOSIS — I129 Hypertensive chronic kidney disease with stage 1 through stage 4 chronic kidney disease, or unspecified chronic kidney disease: Secondary | ICD-10-CM | POA: Diagnosis not present

## 2024-05-03 DIAGNOSIS — E871 Hypo-osmolality and hyponatremia: Secondary | ICD-10-CM | POA: Diagnosis not present

## 2024-05-09 DIAGNOSIS — N1832 Chronic kidney disease, stage 3b: Secondary | ICD-10-CM | POA: Diagnosis not present

## 2024-06-12 DIAGNOSIS — N1832 Chronic kidney disease, stage 3b: Secondary | ICD-10-CM | POA: Diagnosis not present

## 2024-06-27 DIAGNOSIS — N1832 Chronic kidney disease, stage 3b: Secondary | ICD-10-CM | POA: Diagnosis not present
